# Patient Record
Sex: Female | Born: 1970 | Race: White | Hispanic: No | Marital: Single | Smoking: Never smoker
Health system: Southern US, Community
[De-identification: ages and names within clinical notes are randomized; demographics above are authoritative.]

## PROBLEM LIST (undated history)

## (undated) DIAGNOSIS — J45990 Exercise induced bronchospasm: Secondary | ICD-10-CM

## (undated) DIAGNOSIS — K429 Umbilical hernia without obstruction or gangrene: Secondary | ICD-10-CM

## (undated) HISTORY — PX: HERNIA REPAIR: SHX51

## (undated) HISTORY — PX: OTHER SURGICAL HISTORY: SHX169

## (undated) HISTORY — DX: Umbilical hernia without obstruction or gangrene: K42.9

## (undated) HISTORY — PX: DENTAL SURGERY: SHX609

## (undated) HISTORY — DX: Exercise induced bronchospasm: J45.990

---

## 2010-10-13 ENCOUNTER — Inpatient Hospital Stay (HOSPITAL_COMMUNITY)
Admission: AD | Admit: 2010-10-13 | Discharge: 2010-10-14 | Disposition: A | Discharge status: Home / Self Care | Payer: Medicaid Other | Source: Ambulatory Visit | Attending: Obstetrics and Gynecology | Admitting: Obstetrics and Gynecology

## 2010-10-13 ENCOUNTER — Inpatient Hospital Stay (HOSPITAL_COMMUNITY)
Admission: AD | Admit: 2010-10-13 | Discharge: 2010-10-13 | Disposition: A | Discharge status: Home / Self Care | Payer: Medicaid Other | Source: Ambulatory Visit | Attending: Obstetrics and Gynecology | Admitting: Obstetrics and Gynecology

## 2010-10-13 DIAGNOSIS — O479 False labor, unspecified: Secondary | ICD-10-CM | POA: Insufficient documentation

## 2010-10-14 ENCOUNTER — Inpatient Hospital Stay (HOSPITAL_COMMUNITY)
Admission: AD | Admit: 2010-10-14 | Discharge: 2010-10-17 | DRG: 775 | Disposition: A | Discharge status: Home / Self Care | Payer: Medicaid Other | Source: Ambulatory Visit | Attending: Obstetrics and Gynecology | Admitting: Obstetrics and Gynecology

## 2010-10-14 DIAGNOSIS — O09529 Supervision of elderly multigravida, unspecified trimester: Secondary | ICD-10-CM | POA: Diagnosis present

## 2010-10-14 LAB — CBC
HCT: 35.9 % — ABNORMAL LOW (ref 36.0–46.0)
Hemoglobin: 12.7 g/dL (ref 12.0–15.0)
MCH: 35 pg — ABNORMAL HIGH (ref 26.0–34.0)
MCV: 98.9 fL (ref 78.0–100.0)
RBC: 3.63 MIL/uL — ABNORMAL LOW (ref 3.87–5.11)
WBC: 12.6 10*3/uL — ABNORMAL HIGH (ref 4.0–10.5)

## 2010-10-15 LAB — CBC
HCT: 33.3 % — ABNORMAL LOW (ref 36.0–46.0)
Hemoglobin: 11.7 g/dL — ABNORMAL LOW (ref 12.0–15.0)
MCH: 35.2 pg — ABNORMAL HIGH (ref 26.0–34.0)
MCHC: 35.1 g/dL (ref 30.0–36.0)
RBC: 3.32 MIL/uL — ABNORMAL LOW (ref 3.87–5.11)

## 2010-10-17 ENCOUNTER — Encounter (HOSPITAL_COMMUNITY)
Admission: RE | Admit: 2010-10-17 | Discharge: 2010-10-17 | Disposition: A | Discharge status: Home / Self Care | Payer: Self-pay | Source: Ambulatory Visit | Attending: Obstetrics and Gynecology | Admitting: Obstetrics and Gynecology

## 2010-10-17 DIAGNOSIS — O923 Agalactia: Secondary | ICD-10-CM | POA: Insufficient documentation

## 2010-11-17 ENCOUNTER — Encounter (HOSPITAL_COMMUNITY)
Admission: RE | Admit: 2010-11-17 | Discharge: 2010-11-17 | Disposition: A | Discharge status: Home / Self Care | Payer: Medicaid Other | Source: Ambulatory Visit | Attending: Obstetrics and Gynecology | Admitting: Obstetrics and Gynecology

## 2010-11-17 DIAGNOSIS — O923 Agalactia: Secondary | ICD-10-CM | POA: Insufficient documentation

## 2010-12-18 ENCOUNTER — Encounter (HOSPITAL_COMMUNITY)
Admission: RE | Admit: 2010-12-18 | Discharge: 2010-12-18 | Disposition: A | Discharge status: Home / Self Care | Payer: Medicaid Other | Source: Ambulatory Visit | Attending: Obstetrics and Gynecology | Admitting: Obstetrics and Gynecology

## 2010-12-18 DIAGNOSIS — O923 Agalactia: Secondary | ICD-10-CM | POA: Insufficient documentation

## 2011-01-18 ENCOUNTER — Encounter (HOSPITAL_COMMUNITY)
Admission: RE | Admit: 2011-01-18 | Discharge: 2011-01-18 | Disposition: A | Discharge status: Home / Self Care | Payer: Self-pay | Source: Ambulatory Visit | Attending: Obstetrics and Gynecology | Admitting: Obstetrics and Gynecology

## 2011-01-18 DIAGNOSIS — O923 Agalactia: Secondary | ICD-10-CM | POA: Insufficient documentation

## 2011-02-18 ENCOUNTER — Encounter (HOSPITAL_COMMUNITY)
Admission: RE | Admit: 2011-02-18 | Discharge: 2011-02-18 | Disposition: A | Discharge status: Home / Self Care | Payer: Self-pay | Source: Ambulatory Visit | Attending: Obstetrics and Gynecology | Admitting: Obstetrics and Gynecology

## 2011-02-18 DIAGNOSIS — O923 Agalactia: Secondary | ICD-10-CM | POA: Insufficient documentation

## 2011-03-21 ENCOUNTER — Encounter (HOSPITAL_COMMUNITY)
Admission: RE | Admit: 2011-03-21 | Discharge: 2011-03-21 | Disposition: A | Discharge status: Home / Self Care | Payer: Self-pay | Source: Ambulatory Visit | Attending: Obstetrics and Gynecology | Admitting: Obstetrics and Gynecology

## 2011-03-21 DIAGNOSIS — O923 Agalactia: Secondary | ICD-10-CM | POA: Insufficient documentation

## 2011-04-21 ENCOUNTER — Encounter (HOSPITAL_COMMUNITY)
Admission: RE | Admit: 2011-04-21 | Discharge: 2011-04-21 | Disposition: A | Discharge status: Home / Self Care | Payer: Self-pay | Source: Ambulatory Visit | Attending: Obstetrics and Gynecology | Admitting: Obstetrics and Gynecology

## 2011-04-21 DIAGNOSIS — O923 Agalactia: Secondary | ICD-10-CM | POA: Insufficient documentation

## 2015-08-19 ENCOUNTER — Other Ambulatory Visit: Payer: Self-pay | Admitting: Physician Assistant

## 2015-08-19 DIAGNOSIS — Z1231 Encounter for screening mammogram for malignant neoplasm of breast: Secondary | ICD-10-CM

## 2015-08-28 ENCOUNTER — Ambulatory Visit
Admission: RE | Admit: 2015-08-28 | Discharge: 2015-08-28 | Disposition: A | Discharge status: Home / Self Care | Payer: BLUE CROSS/BLUE SHIELD | Source: Ambulatory Visit | Attending: Physician Assistant | Admitting: Physician Assistant

## 2015-08-28 DIAGNOSIS — Z1231 Encounter for screening mammogram for malignant neoplasm of breast: Secondary | ICD-10-CM

## 2016-01-22 ENCOUNTER — Encounter: Payer: Self-pay | Admitting: Sports Medicine

## 2016-01-22 ENCOUNTER — Ambulatory Visit (INDEPENDENT_AMBULATORY_CARE_PROVIDER_SITE_OTHER): Payer: BLUE CROSS/BLUE SHIELD | Admitting: Sports Medicine

## 2016-01-22 DIAGNOSIS — L603 Nail dystrophy: Secondary | ICD-10-CM

## 2016-01-22 DIAGNOSIS — R21 Rash and other nonspecific skin eruption: Secondary | ICD-10-CM

## 2016-01-22 DIAGNOSIS — M79675 Pain in left toe(s): Secondary | ICD-10-CM | POA: Diagnosis not present

## 2016-01-22 DIAGNOSIS — M79674 Pain in right toe(s): Secondary | ICD-10-CM | POA: Diagnosis not present

## 2016-01-22 NOTE — Progress Notes (Deleted)
   Subjective:    Patient ID: Ariana Walsh, female    DOB: [birth year], 45 y.o.   MRN: [id number]  HPI    Review of Systems  Allergic/Immunologic: Positive for environmental allergies.  All other systems reviewed and are negative.      Objective:   Physical Exam        Assessment & Plan:

## 2016-01-22 NOTE — Progress Notes (Signed)
Patient ID: Ariana Walsh, female   DOB: [birth year], 45 y.o.   MRN: [id number] Subjective: Ariana Walsh is a 45 y.o. female patient seen today in office with complaint of painful thickened and discolored nails. Patient is desiring treatment for nail changes; has tried OTC topicals/Medication in the past with no improvement. Reports that nails are becoming difficult to manage because of the thickness. States that this has been going on for several months versus notice few months ago after her nail polish was removed and her pedicurist recommended tea tree oil, VapoRub, and antifungal. Patient has also been soaking with vinegar and Listerine and has developed an eruptive rash to both big toes that is painful in each in nature, has tried antifungal cream with no improvement. Patient has no other pedal complaints at this time.   There are no active problems to display for this patient.   No current outpatient prescriptions on file prior to visit.   No current facility-administered medications on file prior to visit.    Allergies  Allergen Reactions  . Latex     Objective: Physical Exam  General: Well developed, nourished, no acute distress, awake, alert and oriented x 3  Vascular: Dorsalis pedis artery 2/4 bilateral, Posterior tibial artery 2/4 bilateral, skin temperature warm to warm proximal to distal bilateral lower extremities, no varicosities, pedal hair present bilateral.  Neurological: Gross sensation present via light touch bilateral.   Dermatological: Skin is warm, dry, and supple bilateral, Nails 1-10 are tender,Extremely short thick, and discolored with mild subungal debris distally, no webspace macerations present bilateral, no open lesions present bilateral, no callus/corns/hyperkeratotic tissue present bilateral. Eruptive rash noted to the hallux just proximal to the nail folds that he is pruritic in nature with No acute signs of infection  bilateral.  Musculoskeletal: No boney deformities noted bilateral. Muscular strength within normal limits without painon range of motion. No pain with calf compression bilateral.  Assessment and Plan:  Problem List Items Addressed This Visit    None    Visit Diagnoses    Rash/skin eruption    -  Primary    Bilateral hallux    Nail dystrophy        Toe pain, bilateral           -Examined patient -Discussed treatment options for painful dystrophic nails  -Advised patient to return to office in 1 month when nails grow to obtain an adequate fungal culture -Advised good hygiene habits in the meantime -Recommend patient discontinue use of Listerine and aggressive scrubbing and soaking. Advised patient to purchase over-the-counter cortisone 10 cream to apply to eruptive blistered areas and to soak as needed.,nce to twice weekly only with Epsom salts help dry up these blistered areas and to refrain from using excessive topicals at this time until fungal nail cultures have been obtained to determine if patient has truly fungus other types of organisms causing skin and nail changes Today, applied castenalli paint to eruptive blistered areas covered with Band-Aid- -Patient to return in 4 weeks for follow up evaluation and to obtain fungal culture or sooner if symptoms worsen.  Patient reports that her dad has A1 liver disease and if there is fungus present in her nails most likely want to try laser and or topical treatment.  Asencion Islamitorya Fayetta Sorenson, DPM

## 2016-02-24 ENCOUNTER — Ambulatory Visit (INDEPENDENT_AMBULATORY_CARE_PROVIDER_SITE_OTHER): Payer: BLUE CROSS/BLUE SHIELD | Admitting: Sports Medicine

## 2016-02-24 DIAGNOSIS — M79674 Pain in right toe(s): Secondary | ICD-10-CM | POA: Diagnosis not present

## 2016-02-24 DIAGNOSIS — R21 Rash and other nonspecific skin eruption: Secondary | ICD-10-CM | POA: Diagnosis not present

## 2016-02-24 DIAGNOSIS — L603 Nail dystrophy: Secondary | ICD-10-CM | POA: Diagnosis not present

## 2016-02-24 DIAGNOSIS — M79675 Pain in left toe(s): Secondary | ICD-10-CM

## 2016-02-24 MED ORDER — CLOTRIMAZOLE 1 % EX SOLN: 1.0000 "application " | Freq: Two times a day (BID) | CUTANEOUS | 5 refills | Status: DC

## 2016-02-24 NOTE — Progress Notes (Signed)
  Patient ID: Ariana Walsh, female   DOB: 21-Feb-1971, 45 y.o.   MRN: [id number] Subjective: Ariana KittenJennifer L Walsh is a 45 y.o. female patient seen today in office for nail culture and follow up eval of rash at toes; reports that cortisone cream has helped and stopped using tea tree oil. Admits to occasional itching and burning. Use balm moisturizing cream for help with dryness. Patient has no other pedal complaints at this time.   There are no active problems to display for this patient.   No current outpatient prescriptions on file prior to visit.   No current facility-administered medications on file prior to visit.     Allergies  Allergen Reactions  . Latex     Objective: Physical Exam  General: Well developed, nourished, no acute distress, awake, alert and oriented x 3  Vascular: Dorsalis pedis artery 2/4 bilateral, Posterior tibial artery 2/4 bilateral, skin temperature warm to warm proximal to distal bilateral lower extremities, no varicosities, pedal hair present bilateral.  Neurological: Gross sensation present via light touch bilateral.   Dermatological: Skin is warm, dry, and supple bilateral, Nails 1-10 are tender, long, and discolored with mild subungal debris distally, no webspace macerations present bilateral, no open lesions present bilateral, no callus/corns/hyperkeratotic tissue present bilateral. Improving Eruptive rash noted to the hallux and now interspaces all areas just proximal to the nail folds that he is pruritic in nature with no acute signs of infection bilateral.  Musculoskeletal: No boney deformities noted bilateral. Muscular strength within normal limits without painon range of motion. No pain with calf compression bilateral.  Assessment and Plan:  Problem List Items Addressed This Visit    None    Visit Diagnoses    Nail dystrophy    -  Primary   Relevant Medications   clotrimazole (LOTRIMIN) 1 % external solution   Other Relevant Orders   Fungus Culture with Smear   Rash/skin eruption       Relevant Medications   clotrimazole (LOTRIMIN) 1 % external solution   Toe pain, bilateral       Relevant Medications   clotrimazole (LOTRIMIN) 1 % external solution     -Examined patient -Discussed treatment options for painful dystrophic nails  -[facility]; Will call patient with results -Rx Clotrimazole solution to use in between toes and around cuticles  -Advised good hygiene habits in the meantime -Recommend patient to discontinue harsh soaks and continue skin emollients -Patient to return as needed or sooner if symptoms worsen.  Patient reports that her dad has A1 liver disease and if there is fungus present in her nails most likely want to try laser and or topical treatment.  Asencion Islamitorya Cayle Cordoba, DPM

## 2016-03-05 ENCOUNTER — Telehealth: Payer: Self-pay | Admitting: *Deleted

## 2016-03-05 MED ORDER — NONFORMULARY OR COMPOUNDED ITEM: 11 refills | Status: DC

## 2016-03-05 NOTE — Telephone Encounter (Addendum)
-----   Message from Asencion Islamitorya Stover, [place] sent at 03/05/2016  7:22 AM EDT ----- Regarding: [facility] Fungal culture results Can you call patient to let her know that her fungal culture results were positive for fungus (T. Rubrum). Offer Topical from [facility] and laser to the patient as treatment.  Patient told me last office visit that her dad has A1 liver disease and if there is fungus present in her nails most likely want to try laser and or topical treatment  -Dr. Marylene LandStover. Informed pt of Dr. Wynema BirchStover's orders, gave [facility], and transferred schedulers to schedule laser treatment.

## 2016-03-05 NOTE — Addendum Note (Signed)
Addended by: Alphia Kava'CONNELL, VALERY D on: 03/05/2016 11:12 AM   Modules accepted: Orders

## 2016-03-09 ENCOUNTER — Ambulatory Visit: Payer: BLUE CROSS/BLUE SHIELD

## 2016-03-09 DIAGNOSIS — R21 Rash and other nonspecific skin eruption: Secondary | ICD-10-CM

## 2016-03-09 DIAGNOSIS — L603 Nail dystrophy: Secondary | ICD-10-CM

## 2016-03-09 DIAGNOSIS — B351 Tinea unguium: Secondary | ICD-10-CM

## 2016-03-09 NOTE — Progress Notes (Signed)
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   All other systems are negative, noted red irritated tissues on toes bilateral  Laser therapy administered to affected nails and tolerated well. All safety precautions were in place. Advised patient to discontinue all soaks,, only do tea tree oil treatments once a day and dilute in oil to prevent tissue irritation. Advised to continue with prescribed topical. She is to follow up next moth for 2nd of 6 laser treatments

## 2016-03-11 ENCOUNTER — Telehealth: Payer: Self-pay | Admitting: *Deleted

## 2016-03-11 NOTE — Telephone Encounter (Signed)
Pt states she received a compound for her nails and doesn't know how to use it. I told her to put it on the affected toenails and pt states she's not sure which are affected but they are doing laser on all toenails. I told her she could do all. Pt states she'll do all with Q-Tips.

## 2016-04-06 ENCOUNTER — Ambulatory Visit (INDEPENDENT_AMBULATORY_CARE_PROVIDER_SITE_OTHER): Payer: BLUE CROSS/BLUE SHIELD

## 2016-04-06 DIAGNOSIS — L603 Nail dystrophy: Secondary | ICD-10-CM

## 2016-04-08 NOTE — Progress Notes (Signed)
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   All other systems are negative, noted no red or irritated tissue surrounding nails. Skin healing well  Laser therapy administered to affected nails and tolerated well. All safety precautions were in place. Advised to continue with prescribed topical. She is to follow up next month for 3rd of 6 laser treatments

## 2016-05-05 ENCOUNTER — Other Ambulatory Visit (INDEPENDENT_AMBULATORY_CARE_PROVIDER_SITE_OTHER): Payer: BLUE CROSS/BLUE SHIELD

## 2016-05-10 ENCOUNTER — Other Ambulatory Visit: Payer: Self-pay | Admitting: Surgery

## 2016-06-01 ENCOUNTER — Ambulatory Visit: Payer: Self-pay

## 2016-06-01 DIAGNOSIS — L603 Nail dystrophy: Secondary | ICD-10-CM

## 2016-06-02 NOTE — Progress Notes (Signed)
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   All other systems are negative, noted no red or irritated tissue surrounding nails. Skin healing well  Laser therapy administered to affected nails and tolerated well. All safety precautions were in place. Advised to continue with prescribed topical. She is to follow up next month for 5th  of 6 laser treatments

## 2016-06-30 ENCOUNTER — Ambulatory Visit: Payer: Self-pay | Admitting: Sports Medicine

## 2016-06-30 DIAGNOSIS — M79675 Pain in left toe(s): Secondary | ICD-10-CM

## 2016-06-30 DIAGNOSIS — M79674 Pain in right toe(s): Secondary | ICD-10-CM

## 2016-06-30 DIAGNOSIS — L603 Nail dystrophy: Secondary | ICD-10-CM

## 2016-06-30 DIAGNOSIS — B351 Tinea unguium: Secondary | ICD-10-CM

## 2016-07-23 NOTE — Progress Notes (Signed)
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   All other systems are negative, noted no red or irritated tissue surrounding nails. Skin healing well  Laser therapy administered to affected nails and tolerated well. All safety precautions were in place. Advised to continue with prescribed topical. She is to follow up next month for final laser treatment

## 2016-07-26 NOTE — Progress Notes (Signed)
Case discussed with RN -Dr. Jermarcus Mcfadyen 

## 2016-07-30 ENCOUNTER — Ambulatory Visit: Payer: Managed Care, Other (non HMO)

## 2016-07-30 DIAGNOSIS — L603 Nail dystrophy: Secondary | ICD-10-CM

## 2016-07-30 DIAGNOSIS — B351 Tinea unguium: Secondary | ICD-10-CM

## 2016-07-30 NOTE — Progress Notes (Signed)
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   All other systems are negative, noted no red or irritated tissue surrounding nails. Skin healing well  Laser therapy administered to affected nails and tolerated well. All safety precautions were in place. Due to patient showing a lot of concern regarding condition of 5th nails, I advised her to follow up with Dr Marylene LandStover in 1 month

## 2016-08-03 ENCOUNTER — Other Ambulatory Visit: Payer: Self-pay

## 2016-08-03 DIAGNOSIS — L603 Nail dystrophy: Secondary | ICD-10-CM

## 2016-08-03 NOTE — Progress Notes (Signed)
[facility]

## 2016-08-12 ENCOUNTER — Encounter: Payer: Self-pay | Admitting: Sports Medicine

## 2016-08-16 ENCOUNTER — Telehealth: Payer: Self-pay | Admitting: Sports Medicine

## 2016-08-16 NOTE — Telephone Encounter (Signed)
Pt returning your call and apologized for being short this morning. She request you call her back tomorrow morning.

## 2016-08-17 ENCOUNTER — Telehealth: Payer: Self-pay

## 2016-08-17 NOTE — Telephone Encounter (Signed)
LVM regarding negative fungal results. Results did shoe some microtrauma to 5th nails bilateral probably due to shoe usage. Advised her that she can keep her appt if she wanted to and if she had any other questions she can call the office

## 2016-08-24 ENCOUNTER — Encounter: Payer: Self-pay | Admitting: Sports Medicine

## 2016-08-24 ENCOUNTER — Ambulatory Visit (INDEPENDENT_AMBULATORY_CARE_PROVIDER_SITE_OTHER): Payer: Managed Care, Other (non HMO) | Admitting: Sports Medicine

## 2016-08-24 DIAGNOSIS — M79674 Pain in right toe(s): Secondary | ICD-10-CM | POA: Diagnosis not present

## 2016-08-24 DIAGNOSIS — L603 Nail dystrophy: Secondary | ICD-10-CM | POA: Diagnosis not present

## 2016-08-24 DIAGNOSIS — M79675 Pain in left toe(s): Secondary | ICD-10-CM

## 2016-08-24 DIAGNOSIS — M2041 Other hammer toe(s) (acquired), right foot: Secondary | ICD-10-CM | POA: Diagnosis not present

## 2016-08-24 DIAGNOSIS — L84 Corns and callosities: Secondary | ICD-10-CM | POA: Diagnosis not present

## 2016-08-24 NOTE — Progress Notes (Signed)
  Patient ID: Ariana Walsh, female   DOB: [birth year], 46 y.o.   MRN: [id number] Subjective: Ariana KittenJennifer L Walsh is a 46 y.o. female patient seen today s/p laser for nail fungus and states that her nails look better and that now she has rubbing and thick skin at nail fold on right. Patient has no other pedal complaints at this time.   There are no active problems to display for this patient.   Current Outpatient Prescriptions on File Prior to Visit  Medication Sig Dispense Refill  . clotrimazole (LOTRIMIN) 1 % external solution Apply 1 application topically 2 (two) times daily. In between toes and at nail cuticles 30 mL 5  . NONFORMULARY OR COMPOUNDED ITEM [facility]:  Onychomycosis Nail Lacquer - Fluconazole 2%, Terbinafine 1%, DMSO apply to affected area daily. 120 each 11   No current facility-administered medications on file prior to visit.     Allergies  Allergen Reactions  . Latex     Objective: Physical Exam  General: Well developed, nourished, no acute distress, awake, alert and oriented x 3  Vascular: Dorsalis pedis artery 2/4 bilateral, Posterior tibial artery 2/4 bilateral, skin temperature warm to warm proximal to distal bilateral lower extremities, no varicosities, pedal hair present bilateral.  Neurological: Gross sensation present via light touch bilateral.   Dermatological: Skin is warm, dry, and supple bilateral, Nails 1-10 short with clear appearence to nail bed no residual fungus, small keratosis/corn at right 5th toenail at medial margin, no acute signs of infection bilateral.  Musculoskeletal: Mild varus hammertoe boney deformities noted bilateral R>L. Muscular strength within normal limits without painon range of motion. No pain with calf compression bilateral.  Assessment and Plan:  Problem List Items Addressed This Visit    None    Visit Diagnoses    Nail dystrophy    -  Primary   Toe pain, bilateral       Hammer toe of right foot       4-5th    Corns and callosities       Medial 5th toenail at nail fold     -Examined patient -Discussed hammertoe with keratosis at right 5th toe medial nail fold; using tissue nipper debrided keratosis at site and dispensed toe separator  -Recommend consider hammertoe surgery if pain recurs or worsen  -Discussed continued care for painful dystrophic nails  -Continue Clotrimazole solution to use in between toes and around cuticles and tea tree oil to nourish nails and file nail and nail folds as needed  -Continue with steri-shoe  -Advised good hygiene habits  -Patient to return as needed or sooner if symptoms worsen.  Asencion Islamitorya Samuele Storey, DPM

## 2016-12-13 ENCOUNTER — Telehealth: Payer: Self-pay | Admitting: *Deleted

## 2016-12-13 NOTE — Telephone Encounter (Signed)
Pt states she trimmed her toenails back and it appears she has another fungal infection and she would like to discuss possible laser treatment with Shanda BumpsJessica.

## 2016-12-21 ENCOUNTER — Ambulatory Visit (INDEPENDENT_AMBULATORY_CARE_PROVIDER_SITE_OTHER): Payer: Managed Care, Other (non HMO) | Admitting: Sports Medicine

## 2016-12-21 DIAGNOSIS — L603 Nail dystrophy: Secondary | ICD-10-CM | POA: Diagnosis not present

## 2016-12-28 NOTE — Progress Notes (Signed)
Pt presents stating that she is concerned with the appearance of her nails, she is worried that the fungal infection has returned.    Noted slight white discoloration to hallux nails, small area.  Sent nail clippings off to [facility]  Advised to continue with topical nail laquer and we will call her with [facility] results and treatment based on results

## 2016-12-29 ENCOUNTER — Other Ambulatory Visit: Payer: Self-pay | Admitting: Physician Assistant

## 2016-12-29 DIAGNOSIS — Z1231 Encounter for screening mammogram for malignant neoplasm of breast: Secondary | ICD-10-CM

## 2016-12-29 NOTE — Progress Notes (Signed)
Agree with nurse note -Dr. Hodge Stachnik 

## 2016-12-30 ENCOUNTER — Encounter: Payer: Self-pay | Admitting: Physician Assistant

## 2016-12-30 ENCOUNTER — Ambulatory Visit (INDEPENDENT_AMBULATORY_CARE_PROVIDER_SITE_OTHER): Payer: Managed Care, Other (non HMO) | Admitting: Physician Assistant

## 2016-12-30 VITALS — BP 114/85 | HR 71 | Temp 98.0°F | Resp 18 | Ht 65.5 in | Wt 169.4 lb

## 2016-12-30 DIAGNOSIS — K429 Umbilical hernia without obstruction or gangrene: Secondary | ICD-10-CM

## 2016-12-30 DIAGNOSIS — L603 Nail dystrophy: Secondary | ICD-10-CM | POA: Insufficient documentation

## 2016-12-30 DIAGNOSIS — Z6827 Body mass index (BMI) 27.0-27.9, adult: Secondary | ICD-10-CM | POA: Insufficient documentation

## 2016-12-30 DIAGNOSIS — Z7689 Persons encountering health services in other specified circumstances: Secondary | ICD-10-CM

## 2016-12-30 DIAGNOSIS — L739 Follicular disorder, unspecified: Secondary | ICD-10-CM

## 2016-12-30 MED ORDER — MUPIROCIN 2 % EX OINT: 1.0000 "application " | TOPICAL_OINTMENT | Freq: Three times a day (TID) | CUTANEOUS | 1 refills | Status: DC

## 2016-12-30 NOTE — Progress Notes (Signed)
Patient ID: Ariana Walsh, female     DOB: [birth year], 46 y.o.    MRN: [id number]  PCP: Porfirio Oar, PA-C  Chief Complaint  Patient presents with  . Establish Care    per PT would like to talk about recent medical issues.    Subjective:   This patient is new to me and presents to establish care.  Another patient, Ariana Walsh, is a friend of her and recommended me to her.  She has a history of asthma and allergies, and has an umbilical hernia. Dr. Rayburn Ma has recommended surgical repair, but she has delayed, waiting for a more convenient time. It doesn't hurt at all, but she has to push it back in regularly.  She has a history of bilateral breast tenderness, which resolved when she eliminated caffeine. She has resumed caffeine, and has noticed a recurrence of the breast pain, but it is intermittent and not as severe as previously. Mammogram is scheduled for this summer.  Her 26 year-old son, Ariana Walsh, was conceived by IVF.  She and her husband had split up by the time she conceived, but they have not married. He is [place] and splits his [place] and [place]. They are in the process of re-evaluating their relationship and whether it may be time to formally/legally end it.  Her mother is being evaluated for possible LYNCH syndrome, and the patient has had genetic counseling and testing. Her results are not yet available.  Believes that she may be approaching menopause Menstrual cycle is starting to shorten, down to 26 days from 28. Random hot flashes at night. "I know it's coming." "I know that I don't want to do hormones." Joints-"hips are funky," felt like one hip gave way last summer when running to catch a train at [place], intermittent pain. DeQuervains following birth of her son. Again, thinks these issues are hormonal. 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  Skin on her arms break out a  lot. Recalls this was a problem as a child, but when she lived in the [place] x 15 years, it stopped, recurring when she moved back to the [place]. Bumpy, painfully bumpy.  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 She a "picker" and can't keep herself from scratching and picking at the lesions, leaving sores.  Review of Systems As above. No CP, SOB, HA, dizziness, nausea, vomiting, diarrhea, constipation.  Prior to Admission medications   Medication Sig Start Date End Date Taking? Authorizing Provider  clotrimazole (LOTRIMIN) 1 % external solution Apply 1 application topically 2 (two) times daily. In between toes and at nail cuticles 02/24/16  Yes Asencion Islam, DPM  NONFORMULARY OR COMPOUNDED ITEM [facility]:  Onychomycosis Nail Lacquer - Fluconazole 2%, Terbinafine 1%, DMSO apply to affected area daily. 03/05/16  Yes Asencion Islam, DPM     Allergies  Allergen Reactions  . Latex      Patient Active Problem List   Diagnosis Date Noted  . Umbilical hernia 12/30/2016  . BMI 27.0-27.9,adult 12/30/2016  . Dystrophic nail 12/30/2016     Family History  Problem Relation Age of Onset  . Breast cancer Mother        post-menopausal, DCIS  . Endometrial cancer Mother        mid-late 87's  . Hypertension Mother   . Hypertension Father   . Diabetes Father   . Heart disease Father  s/p 3V CABG age 47  . Mental illness Father        depression  . Arthritis Father        bilateral knee replacements  . Mental illness Brother        depression     Social History   Social History  . Marital status: Married    Spouse name: [name]  . Number of children: 1  . Years of education: MFA in Directing   Occupational History  . unemployed     recently completed graduate degree, looking for work   Social History Main Topics  . Smoking status: Never Smoker  . Smokeless tobacco: Never Used  . Alcohol use 0.0 - 1.8 oz/week  . Drug use: No  . Sexual activity: Not Currently     Partners: Male   Other Topics Concern  . Not on file   Social History Narrative   Estranged from husband since 2011.   Lives with her son.   Her son was conceived by IVF after she and her husband split.   Breast fed x 4 years.            Objective:  Physical Exam  Constitutional: She is oriented to person, place, and time. She appears well-developed and well-nourished. She is active and cooperative. No distress.  BP 114/85 (BP Location: Right Arm, Patient Position: Sitting, Cuff Size: Normal)   Pulse 71   Temp 98 F (36.7 C) (Oral)   Resp 18   Ht 5' 5.5" (1.664 m)   Wt 169 lb 6.4 oz (76.8 kg)   LMP 12/14/2016   SpO2 100%   BMI 27.76 kg/m   HENT:  Head: Normocephalic and atraumatic.  Right Ear: Hearing normal.  Left Ear: Hearing normal.  Eyes: Conjunctivae are normal. No scleral icterus.  Neck: Normal range of motion. Neck supple. No thyromegaly present.  Cardiovascular: Normal rate, regular rhythm and normal heart sounds.   Pulses:      Radial pulses are 2+ on the right side, and 2+ on the left side.  Pulmonary/Chest: Effort normal and breath sounds normal.  Lymphadenopathy:       Head (right side): No tonsillar, no preauricular, no posterior auricular and no occipital adenopathy present.       Head (left side): No tonsillar, no preauricular, no posterior auricular and no occipital adenopathy present.    She has no cervical adenopathy.       Right: No supraclavicular adenopathy present.       Left: No supraclavicular adenopathy present.  Neurological: She is alert and oriented to person, place, and time. No sensory deficit.  Skin: Skin is warm, dry and intact. Lesion (multiple excoriated papules on the upper arms, consistent with her description of picking at keratosis pilaris-type rash) noted. No rash noted. No cyanosis or erythema. Nails show no clubbing.  Psychiatric: She has a normal mood and affect. Her speech is normal and behavior is normal.      Assessment &  Plan:   Problem List Items Addressed This Visit    Umbilical hernia    surgeryOK to continue to observe for now. If it becomes painful, non-reducible, she will proceed with surgical intervention.      BMI 27.0-27.9,adult    Healthy eating, regular exercise       Other Visit Diagnoses    Encounter to establish care    -  Primary   Plan to see her annually for wellness, more often as needed.  Folliculitis       Possibly underlying keratosis pilaris. Topical treatment per patient preference.   Relevant Medications   mupirocin ointment (BACTROBAN) 2 %       No Follow-up on file.   Fernande Bras, PA-C [facility]

## 2016-12-30 NOTE — Assessment & Plan Note (Addendum)
surgeryOK to continue to observe for now. If it becomes painful, non-reducible, she will proceed with surgical intervention.

## 2016-12-30 NOTE — Assessment & Plan Note (Signed)
Healthy eating, regular exercise! 

## 2016-12-30 NOTE — Patient Instructions (Addendum)
We recommend that you schedule a mammogram for breast cancer screening. Typically, you do not need a referral to do this. Please contact a local imaging center to schedule your mammogram.  [facility] - [phone]  *ask for the Radiology Department [facility]) - [phone] or [phone]  [facility] - [phone] [facility] - [phone] [facility] - [phone]  *ask for the Radiology Department [facility] - [phone]  *ask for the Radiology Department [facility] - [phone]  *ask for the Mammography Department [facility] - [phone]     IF you received an x-ray today, you will receive an invoice from [facility]. Please contact [facility] at [phone] with questions or concerns regarding your invoice.   IF you received labwork today, you will receive an invoice from [facility]. Please contact [facility] at [phone] with questions or concerns regarding your invoice.   Our billing staff will not be able to assist you with questions regarding bills from these companies.  You will be contacted with the lab results as soon as they are available. The fastest way to get your results is to activate your My Chart account. Instructions are located on the last page of this paperwork. If you have not heard from us regarding the results in 2 weeks, please contact this office.      

## 2017-01-05 ENCOUNTER — Encounter: Payer: Self-pay | Admitting: Sports Medicine

## 2017-01-10 ENCOUNTER — Ambulatory Visit
Admission: RE | Admit: 2017-01-10 | Discharge: 2017-01-10 | Disposition: A | Discharge status: Home / Self Care | Payer: Managed Care, Other (non HMO) | Source: Ambulatory Visit | Attending: Physician Assistant | Admitting: Physician Assistant

## 2017-01-10 DIAGNOSIS — Z1231 Encounter for screening mammogram for malignant neoplasm of breast: Secondary | ICD-10-CM

## 2017-03-16 ENCOUNTER — Telehealth: Payer: Self-pay | Admitting: Sports Medicine

## 2017-03-16 NOTE — Telephone Encounter (Signed)
I am trying to get the nail lacquer re-ordered through [facility] as I have ran out of refills. I was told by them that they said your office stated you have no records of me. I've had the laser treatment done by Shanda BumpsJessica and I'm a Dr. Marylene LandStover pt. Also, I had some lab/pathology done and I have never received the results of those. I would like the lacquer to be reordered because I'm using it proactively.

## 2017-03-17 MED ORDER — NONFORMULARY OR COMPOUNDED ITEM: 11 refills | Status: DC

## 2017-03-17 NOTE — Addendum Note (Signed)
Addended by: Alphia Kava'CONNELL, Fransico Sciandra D on: 03/17/2017 01:35 PM   Modules accepted: Orders

## 2017-03-17 NOTE — Telephone Encounter (Signed)
Left message informing pt, Dr. Marylene LandStover had ordered refill of the [facility] Nail Lacquer as prn 1 year. [facility].

## 2017-07-02 ENCOUNTER — Encounter: Payer: Self-pay | Admitting: Physician Assistant

## 2017-07-02 ENCOUNTER — Other Ambulatory Visit: Payer: Self-pay

## 2017-07-02 ENCOUNTER — Ambulatory Visit: Payer: Managed Care, Other (non HMO) | Admitting: Physician Assistant

## 2017-07-02 VITALS — BP 118/72 | HR 71 | Temp 97.6°F | Resp 18 | Ht 65.5 in | Wt 170.2 lb

## 2017-07-02 DIAGNOSIS — N644 Mastodynia: Secondary | ICD-10-CM | POA: Diagnosis not present

## 2017-07-02 DIAGNOSIS — R059 Cough, unspecified: Secondary | ICD-10-CM

## 2017-07-02 DIAGNOSIS — R05 Cough: Secondary | ICD-10-CM | POA: Diagnosis not present

## 2017-07-02 MED ORDER — ALBUTEROL SULFATE HFA 108 (90 BASE) MCG/ACT IN AERS: 2.0000 | INHALATION_SPRAY | RESPIRATORY_TRACT | 1 refills | Status: DC | PRN

## 2017-07-02 NOTE — Progress Notes (Signed)
Patient ID: Ariana Walsh, female    DOB: December 30, 1970, 46 y.o.   MRN: [id number]  PCP: Porfirio OarJeffery, Phyllis Whitefield, PA-C  Chief Complaint  Patient presents with  . Breast Pain    x1 month, right breast, pt states the breast pain is deep within her breast and she states she has a bruise that she doesn't know where it came from. Pt states she doesn't have a lump and no discharge.    Subjective:   Presents for evaluation of tenderness and a bruise on the RIGHT breast.   Her breasts have been tender, for about a month, primarily on the RIGHT. It's a "deep ache" and constant. Different from tenderness she has experienced associated with caffeine use.  The bruise appeared several weeks ago, resolved, then recurred. No recalled trauma or injury, new activity, etc.  No redness, skin lesions, swelling, nipple discharge. No fever, chills. No axillary lumps or tenderness.    Review of Systems  Constitutional: Positive for fatigue. Negative for chills and fever.  HENT: Positive for rhinorrhea. Negative for sore throat.   Eyes: Negative for visual disturbance.  Respiratory: Positive for cough. Negative for chest tightness, shortness of breath and wheezing.   Cardiovascular: Negative for chest pain and palpitations.  Gastrointestinal: Negative for abdominal pain, diarrhea, nausea and vomiting.  Genitourinary: Negative for dysuria, frequency, hematuria and urgency.  Musculoskeletal: Negative for arthralgias and myalgias.  Skin: Negative for rash.  Neurological: Negative for dizziness, weakness and headaches.  Psychiatric/Behavioral: Negative for decreased concentration. The patient is not nervous/anxious.        Patient Active Problem List   Diagnosis Date Noted  . Umbilical hernia 12/30/2016  . BMI 27.0-27.9,adult 12/30/2016  . Dystrophic nail 12/30/2016     Prior to Admission medications   Medication Sig Start Date End Date Taking? Authorizing Provider  NONFORMULARY OR  COMPOUNDED ITEM [facility]:  Onychomycosis Nail Lacquer - Fluconazole 2%, Terbinafine 1%, DMSO apply to affected area daily. 03/17/17  Yes Stover, Titorya, DPM  clotrimazole (LOTRIMIN) 1 % external solution Apply 1 application topically 2 (two) times daily. In between toes and at nail cuticles Patient not taking: Reported on 07/02/2017 02/24/16   Asencion IslamStover, Titorya, DPM  mupirocin ointment (BACTROBAN) 2 % Apply 1 application topically 3 (three) times daily. Patient not taking: Reported on 07/02/2017 12/30/16   Porfirio OarJeffery, Yashar Inclan, PA-C     Allergies  Allergen Reactions  . Latex        Objective:  Physical Exam  Constitutional: She is oriented to person, place, and time. She appears well-developed and well-nourished. She is active and cooperative. No distress.  BP 118/72 (BP Location: Right Arm, Patient Position: Sitting, Cuff Size: Normal)   Pulse 71   Temp 97.6 F (36.4 C) (Oral)   Resp 18   Ht 5' 5.5" (1.664 m)   Wt 170 lb 3.2 oz (77.2 kg)   SpO2 97%   BMI 27.89 kg/m    Eyes: Conjunctivae are normal.  Pulmonary/Chest: Effort normal. Right breast exhibits tenderness. Right breast exhibits no inverted nipple, no mass, no nipple discharge and no skin change. Left breast exhibits no inverted nipple, no mass, no nipple discharge, no skin change and no tenderness. Breasts are symmetrical.    Neurological: She is alert and oriented to person, place, and time.  Psychiatric: She has a normal mood and affect. Her speech is normal and behavior is normal.           Assessment & Plan:   1.  Breast pain, right - US BREAST LTD UNI RIGHT INC AXILLA; Future  2. Cough History of asthma. Anticipatory guidance provided. If symptoms worsen/persist, RTC for re-evaluation. - albuterol (PROVENTIL HFA;VENTOLIN HFA) 108 (90 Base) MCG/ACT inhaler; Inhale 2 puffs into the lungs every 4 (four) hours as needed for wheezing or shortness of breath (cough, shortness of breath or wheezing.).  Dispense: 1  Inhaler; Refill: 1    Return if symptoms worsen or fail to improve.   Fernande Brashelle S. India Jolin, PA-C [facility]

## 2017-07-02 NOTE — Patient Instructions (Signed)
     IF you received an x-ray today, you will receive an invoice from [facility]. Please contact [facility] at [phone] with questions or concerns regarding your invoice.   IF you received labwork today, you will receive an invoice from [facility]. Please contact [facility] at [phone] with questions or concerns regarding your invoice.   Our billing staff will not be able to assist you with questions regarding bills from these companies.  You will be contacted with the lab results as soon as they are available. The fastest way to get your results is to activate your My Chart account. Instructions are located on the last page of this paperwork. If you have not heard from us regarding the results in 2 weeks, please contact this office.     

## 2017-07-09 ENCOUNTER — Other Ambulatory Visit: Payer: Self-pay | Admitting: Physician Assistant

## 2017-07-09 DIAGNOSIS — N644 Mastodynia: Secondary | ICD-10-CM

## 2017-07-27 ENCOUNTER — Ambulatory Visit
Admission: RE | Admit: 2017-07-27 | Discharge: 2017-07-27 | Disposition: A | Discharge status: Home / Self Care | Payer: Managed Care, Other (non HMO) | Source: Ambulatory Visit | Attending: Physician Assistant | Admitting: Physician Assistant

## 2017-07-27 ENCOUNTER — Ambulatory Visit: Payer: Managed Care, Other (non HMO)

## 2017-07-27 DIAGNOSIS — N644 Mastodynia: Secondary | ICD-10-CM

## 2017-07-27 IMAGING — MG 2D DIGITAL DIAGNOSTIC UNILATERAL RIGHT MAMMOGRAM WITH CAD AND AD
2 series · 3 of 6 positions shown · non-contrast
Comparison: [DATE] and early

ADDENDUM: CORRECTED REPORT:
ADDENDUM: CORRECTED REPORT
Screening mammogram is recommended in [DATE].
CLINICAL DATA: Patient describes the deep achiness in the right
breast. She noted some bruising in the medial portion of the right
breast which has resolved. Patient has no recollection of trauma.

EXAM:
2D DIGITAL DIAGNOSTIC UNILATERAL RIGHT MAMMOGRAM WITH CAD AND
ADJUNCT TOMO

[R MLO synth-2D]
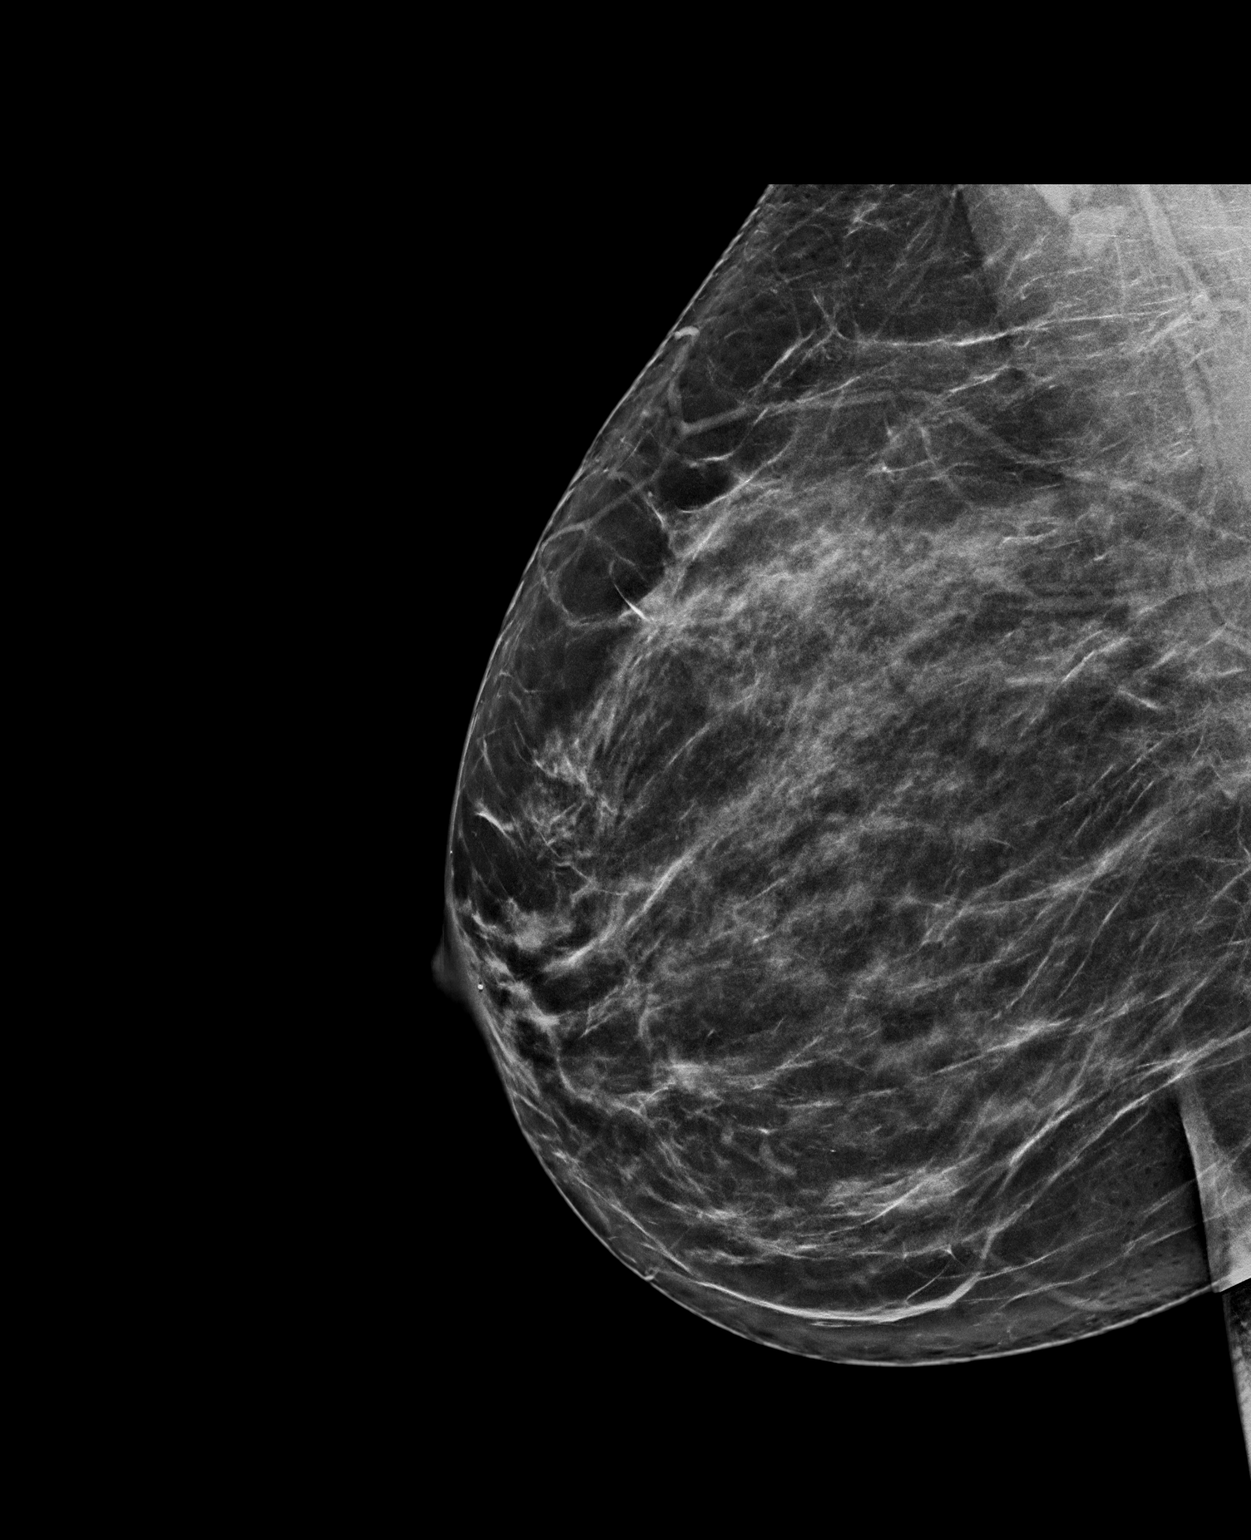

[R CC tomo · 2 of 74 frames shown]
[frame 24/74]
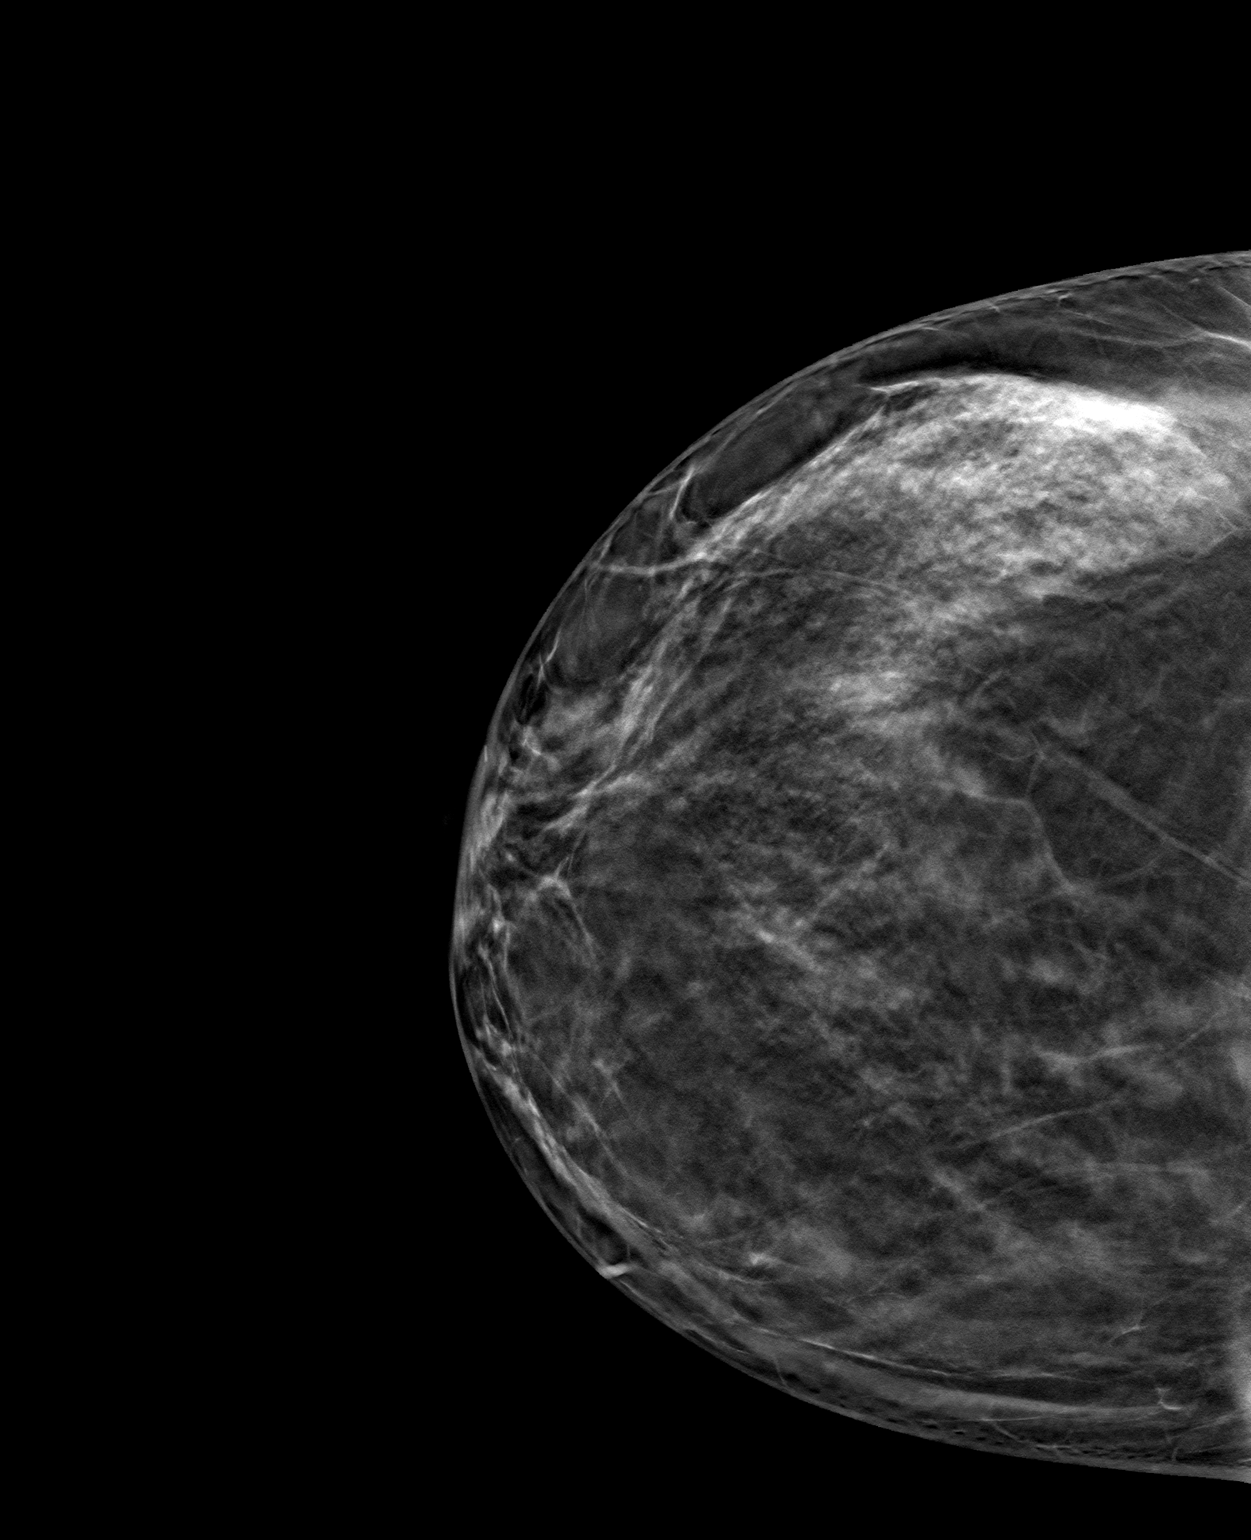
[frame 37/74]
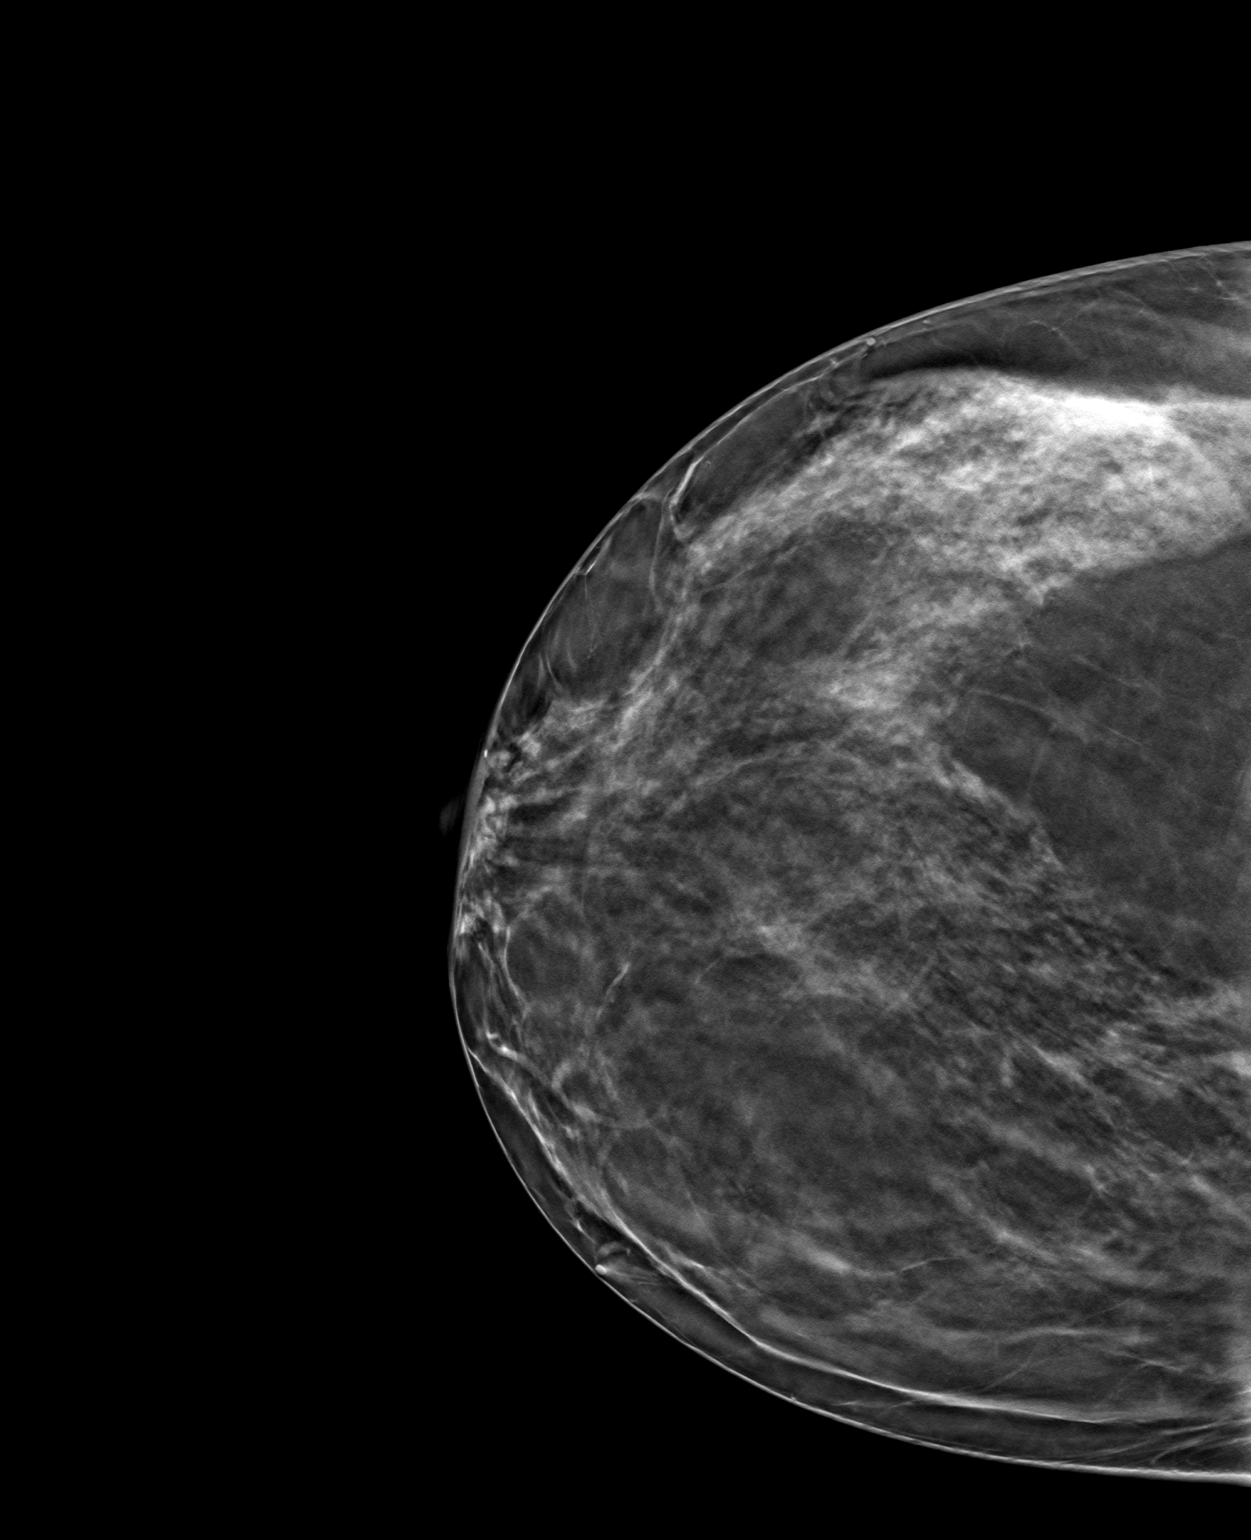

[3 of 6 positions shown; findings below may reference images not displayed]

ACR Breast Density Category c: The breast tissue is heterogeneously
dense, which may obscure small masses.
FINDINGS: No suspicious mass, distortion, or microcalcifications are
identified to suggest presence of malignancy.

Mammographic images were processed with CAD.
IMPRESSION: No mammographic evidence for malignancy.

RECOMMENDATION:
Screening mammogram is recommended in [DATE].

I have discussed the findings and recommendations with the patient.
Results were also provided in writing at the conclusion of the
visit. If applicable, a reminder letter will be sent to the patient
regarding the next appointment.

BI-RADS CATEGORY  1: Negative.

## 2017-09-11 ENCOUNTER — Other Ambulatory Visit: Payer: Self-pay

## 2017-09-11 ENCOUNTER — Encounter (HOSPITAL_COMMUNITY): Payer: Self-pay | Admitting: Emergency Medicine

## 2017-09-11 ENCOUNTER — Ambulatory Visit (HOSPITAL_COMMUNITY)
Admission: EM | Admit: 2017-09-11 | Discharge: 2017-09-11 | Disposition: A | Discharge status: Home / Self Care | Payer: Managed Care, Other (non HMO) | Attending: Internal Medicine | Admitting: Internal Medicine

## 2017-09-11 DIAGNOSIS — M25511 Pain in right shoulder: Secondary | ICD-10-CM | POA: Diagnosis not present

## 2017-09-11 MED ORDER — DEXAMETHASONE SODIUM PHOSPHATE 10 MG/ML IJ SOLN
INTRAMUSCULAR | Status: AC
  Filled 2017-09-11: qty 1

## 2017-09-11 MED ORDER — KETOROLAC TROMETHAMINE 30 MG/ML IJ SOLN
INTRAMUSCULAR | Status: AC
  Filled 2017-09-11: qty 1

## 2017-09-11 MED ORDER — DEXAMETHASONE SODIUM PHOSPHATE 10 MG/ML IJ SOLN
10.0000 mg | Freq: Once | INTRAMUSCULAR | Status: AC
  Administered 2017-09-11: 10 mg via INTRAMUSCULAR

## 2017-09-11 MED ORDER — KETOROLAC TROMETHAMINE 30 MG/ML IJ SOLN
30.0000 mg | Freq: Once | INTRAMUSCULAR | Status: AC
  Administered 2017-09-11: 30 mg via INTRAMUSCULAR

## 2017-09-11 MED ORDER — MELOXICAM 7.5 MG PO TABS: 7.5000 mg | ORAL_TABLET | Freq: Every day | ORAL | 0 refills | Status: DC

## 2017-09-11 NOTE — ED Provider Notes (Signed)
[facility]    CSN: [id number] Arrival date & time: 09/11/17  1928     History   Chief Complaint Chief Complaint  Patient presents with  . Shoulder Pain  . Eye Problem    HPI Ariana Walsh is a 47 y.o. female.   47 year old female comes in for 2 day history of right shoulder pain.  States that she has had this pain on and off for the past year, usually resolves on its own.  However, her right shoulder pain has been gradually worsening, and now with limited range of motion due to pain.  States the pain mostly on the shoulder but can radiate to the biceps.  Denies injury/trauma.  Took ibuprofen 200 mg without relief.  States that in the past, she has not needed to take anything for it.  Denies numbness, tingling.      Past Medical History:  Diagnosis Date  . Hernia, umbilical     Patient Active Problem List   Diagnosis Date Noted  . Umbilical hernia 12/30/2016  . BMI 27.0-27.9,adult 12/30/2016  . Dystrophic nail 12/30/2016    Past Surgical History:  Procedure Laterality Date  . DENTAL SURGERY      OB History    No data available       Home Medications    Prior to Admission medications   Medication Sig Start Date End Date Taking? Authorizing Provider  ibuprofen (ADVIL,MOTRIN) 200 MG tablet Take 200 mg by mouth every 6 (six) hours as needed.   Yes [provider]  meloxicam (MOBIC) 7.5 MG tablet Take 1 tablet (7.5 mg total) by mouth daily. 09/11/17   Belinda FisherYu, Dewayne Jurek V, PA-C    Family History Family History  Problem Relation Age of Onset  . Breast cancer Mother        post-menopausal, DCIS  . Endometrial cancer Mother        mid-late 1460's  . Hypertension Mother   . Hypertension Father   . Diabetes Father   . Heart disease Father        s/p 3V CABG age 48  . Mental illness Father        depression  . Arthritis Father        bilateral knee replacements  . Mental illness Brother        depression    Social History Social History    Tobacco Use  . Smoking status: Never Smoker  . Smokeless tobacco: Never Used  Substance Use Topics  . Alcohol use: Yes    Alcohol/week: 0.0 - 1.8 oz  . Drug use: No     Allergies   Latex   Review of Systems Review of Systems  Reason unable to perform ROS: See HPI as above.     Physical Exam Triage Vital Signs ED Triage Vitals  Enc Vitals Group     BP 09/11/17 2010 (!) 143/78     Pulse Rate 09/11/17 2010 73     Resp --      Temp 09/11/17 2010 98.2 F (36.8 C)     Temp Source 09/11/17 2010 Oral     SpO2 09/11/17 2010 100 %     Weight 09/11/17 2005 176 lb (79.8 kg)     Height --      Head Circumference --      Peak Flow --      Pain Score 09/11/17 2004 6     Pain Loc --      Pain Edu? --  Excl. in GC? --    No data found.  Updated Vital Signs BP (!) 143/78 (BP Location: Right Arm)   Pulse 73   Temp 98.2 F (36.8 C) (Oral)   Wt 176 lb (79.8 kg)   LMP 08/28/2017   SpO2 100%   BMI 28.84 kg/m   Physical Exam  Constitutional: She is oriented to person, place, and time. She appears well-developed and well-nourished. No distress.  HENT:  Head: Normocephalic and atraumatic.  Eyes: Conjunctivae, EOM and lids are normal. Pupils are equal, round, and reactive to light.  Neck: Normal range of motion. Neck supple. No spinous process tenderness and no muscular tenderness present. Normal range of motion present.  Cardiovascular: Normal rate, regular rhythm and normal heart sounds. Exam reveals no gallop and no friction rub.  No murmur heard. Pulmonary/Chest: Effort normal and breath sounds normal. She has no wheezes. She has no rales.  Musculoskeletal:  No tenderness on palpation of the spinous processes.  No tenderness on palpation of trapezius muscle.  Tenderness to palpation of anterior shoulder around the biceps tendon.  Tenderness on palpation of lateral shoulder as well.  Decreased active and passive range of motion.  Strength deferred.  Sensation intact and  equal bilaterally.  Grip strength normal.  Radial pulse 2+ and equal bilaterally.  Cap refill less than 2 seconds.   Neurological: She is alert and oriented to person, place, and time.  Skin: Skin is warm and dry.    UC Treatments / Results  Labs (all labs ordered are listed, but only abnormal results are displayed) Labs Reviewed - No data to display  EKG  EKG Interpretation None       Radiology No results found.  Procedures Procedures (including critical care time)  Medications Ordered in UC Medications  ketorolac (TORADOL) 30 MG/ML injection 30 mg (30 mg Intramuscular Given 09/11/17 2108)  dexamethasone (DECADRON) injection 10 mg (10 mg Intramuscular Given 09/11/17 2108)     Initial Impression / Assessment and Plan / UC Course  I have reviewed the triage vital signs and the nursing notes.  Pertinent labs & imaging results that were available during my care of the patient were reviewed by me and considered in my medical decision making (see chart for details).    Discussed possible tendinitis causing symptoms in the setting of atraumatic shoulder pain.  Toradol and Decadron injection in office today.  Start Mobic as directed.  Ice compress.  Follow-up with orthopedics for further evaluation if symptoms not improving.  Final Clinical Impressions(s) / UC Diagnoses   Final diagnoses:  Acute pain of right shoulder    ED Discharge Orders        Ordered    meloxicam (MOBIC) 7.5 MG tablet  Daily     09/11/17 2047       Belinda Fisher, PA-C 09/11/17 2111

## 2017-09-11 NOTE — ED Triage Notes (Signed)
No known injury

## 2017-09-11 NOTE — Discharge Instructions (Signed)
Start Mobic as directed.  Do not take ibuprofen (Advil, Motrin)/naproxen (Aleve) while on Mobic.  Ice compress of the shoulder. Rotate shoulder to keep it from getting stiff.  Follow-up with orthopedics if symptoms not improving.

## 2017-09-11 NOTE — ED Triage Notes (Signed)
C/o right shoulder limited ROM onset off and on x one year, radiates down arm and is "sore to touch"

## 2017-09-11 NOTE — ED Triage Notes (Signed)
Pt states while sitting in our waiting area left eye started "stinging and watering"

## 2017-09-13 ENCOUNTER — Ambulatory Visit (INDEPENDENT_AMBULATORY_CARE_PROVIDER_SITE_OTHER): Payer: Managed Care, Other (non HMO)

## 2017-09-13 ENCOUNTER — Ambulatory Visit: Payer: Managed Care, Other (non HMO) | Admitting: Physician Assistant

## 2017-09-13 ENCOUNTER — Other Ambulatory Visit: Payer: Self-pay

## 2017-09-13 ENCOUNTER — Encounter: Payer: Self-pay | Admitting: Physician Assistant

## 2017-09-13 VITALS — BP 130/78 | HR 84 | Resp 16 | Ht 65.5 in | Wt 177.2 lb

## 2017-09-13 DIAGNOSIS — M25511 Pain in right shoulder: Secondary | ICD-10-CM

## 2017-09-13 DIAGNOSIS — M5410 Radiculopathy, site unspecified: Secondary | ICD-10-CM

## 2017-09-13 IMAGING — DX DG SHOULDER 2+V*R*
3 series · 3 of 3 positions shown · non-contrast
Comparison: None.

CLINICAL DATA: Right shoulder pain.  No known injury.

EXAM:
RIGHT SHOULDER - 2+ VIEW

[shoulder ap]
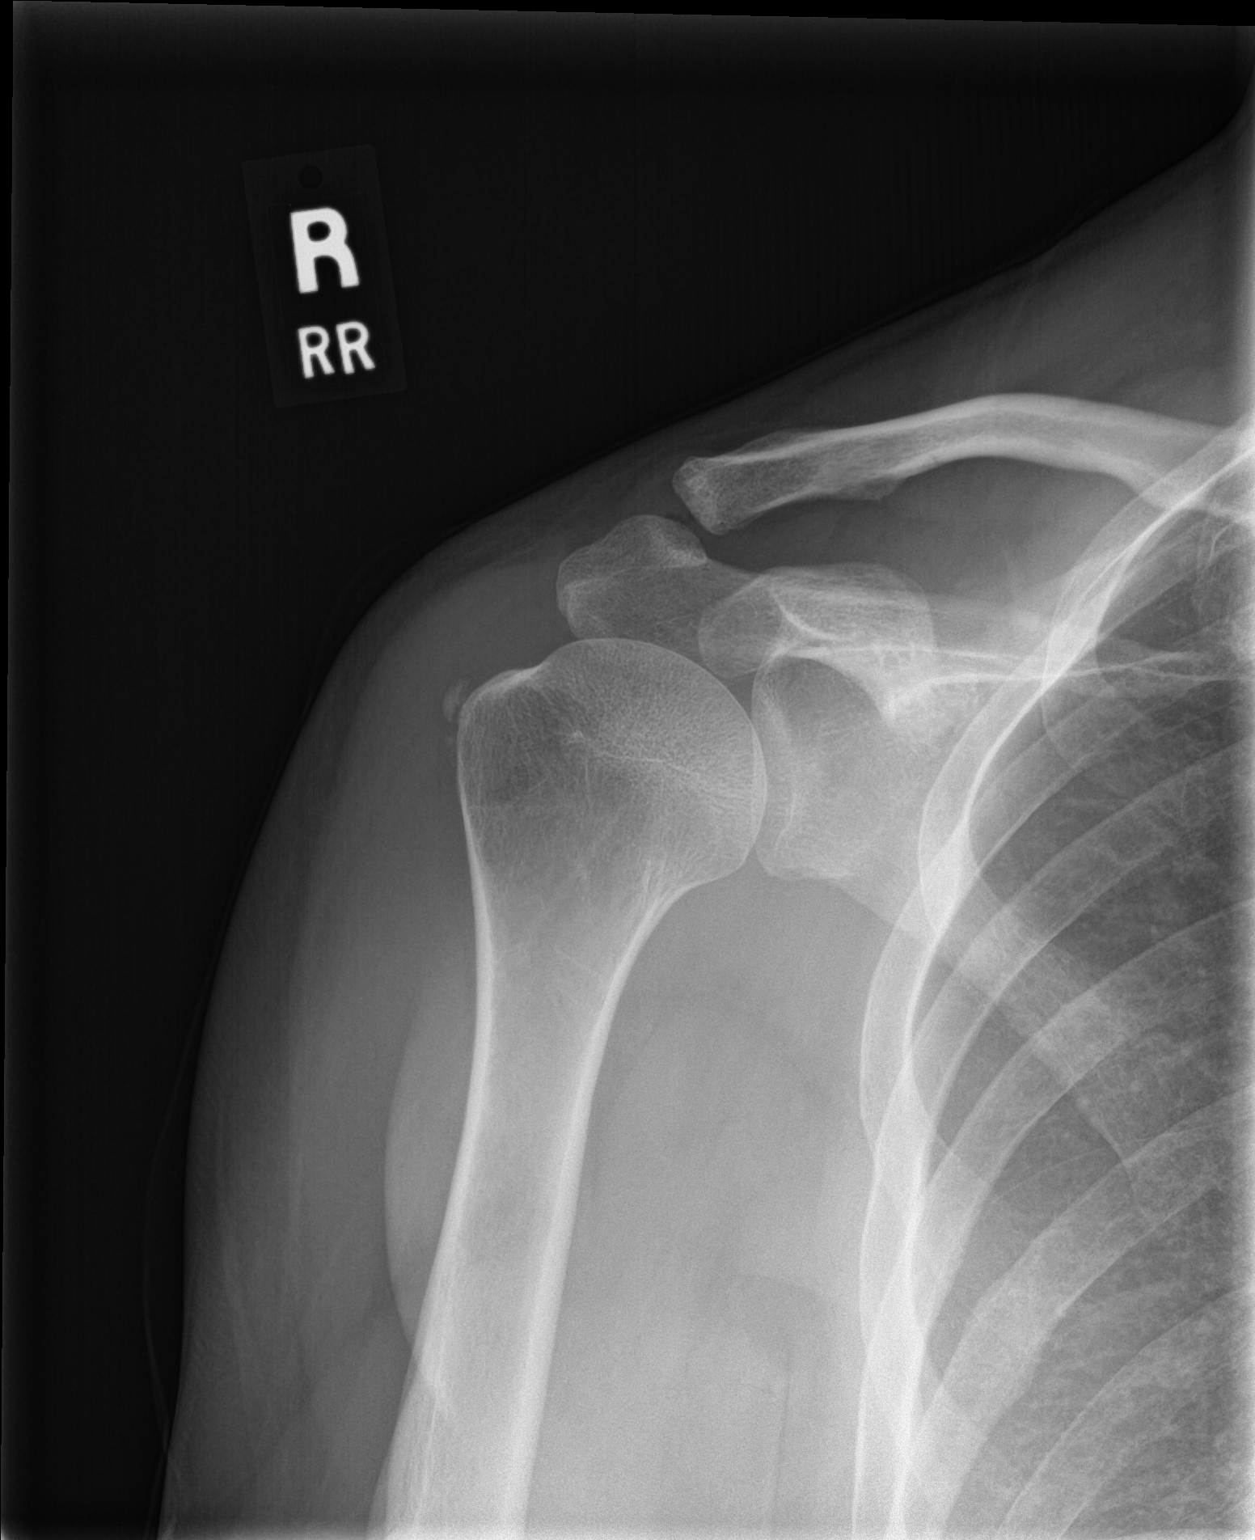

[shoulder y-view]
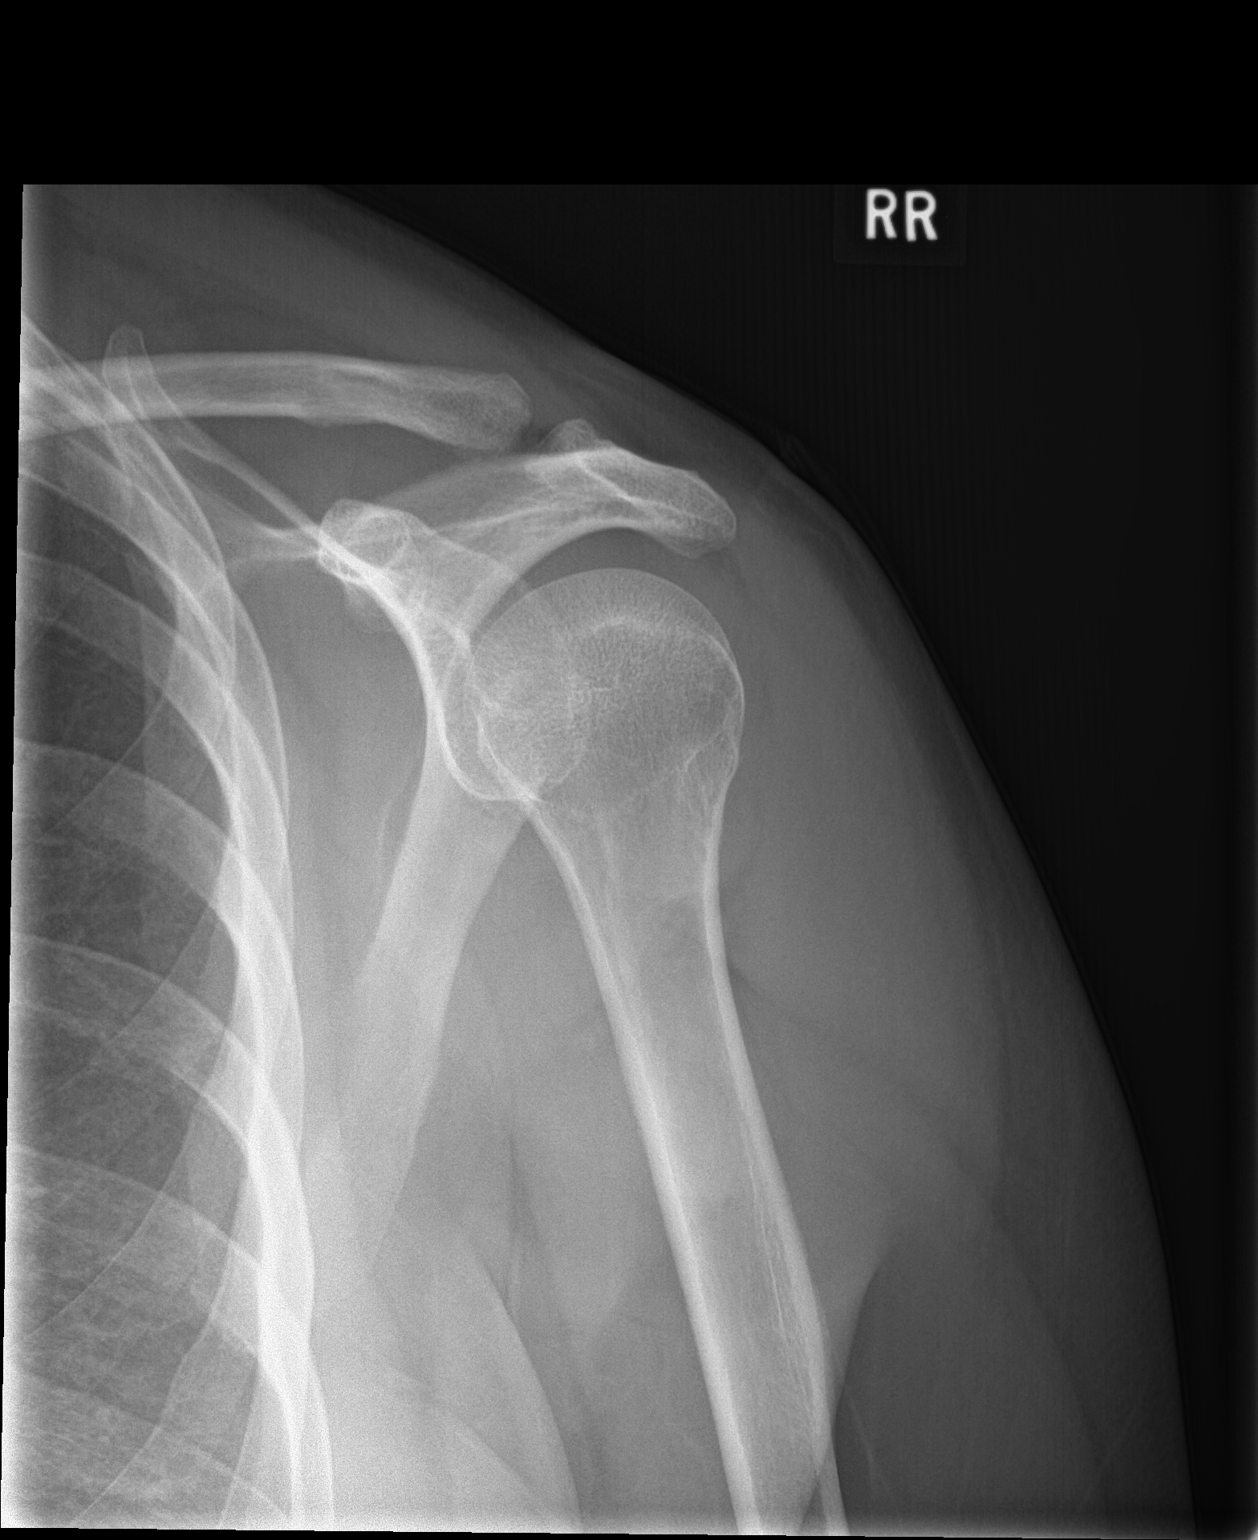

[shoulder axial]
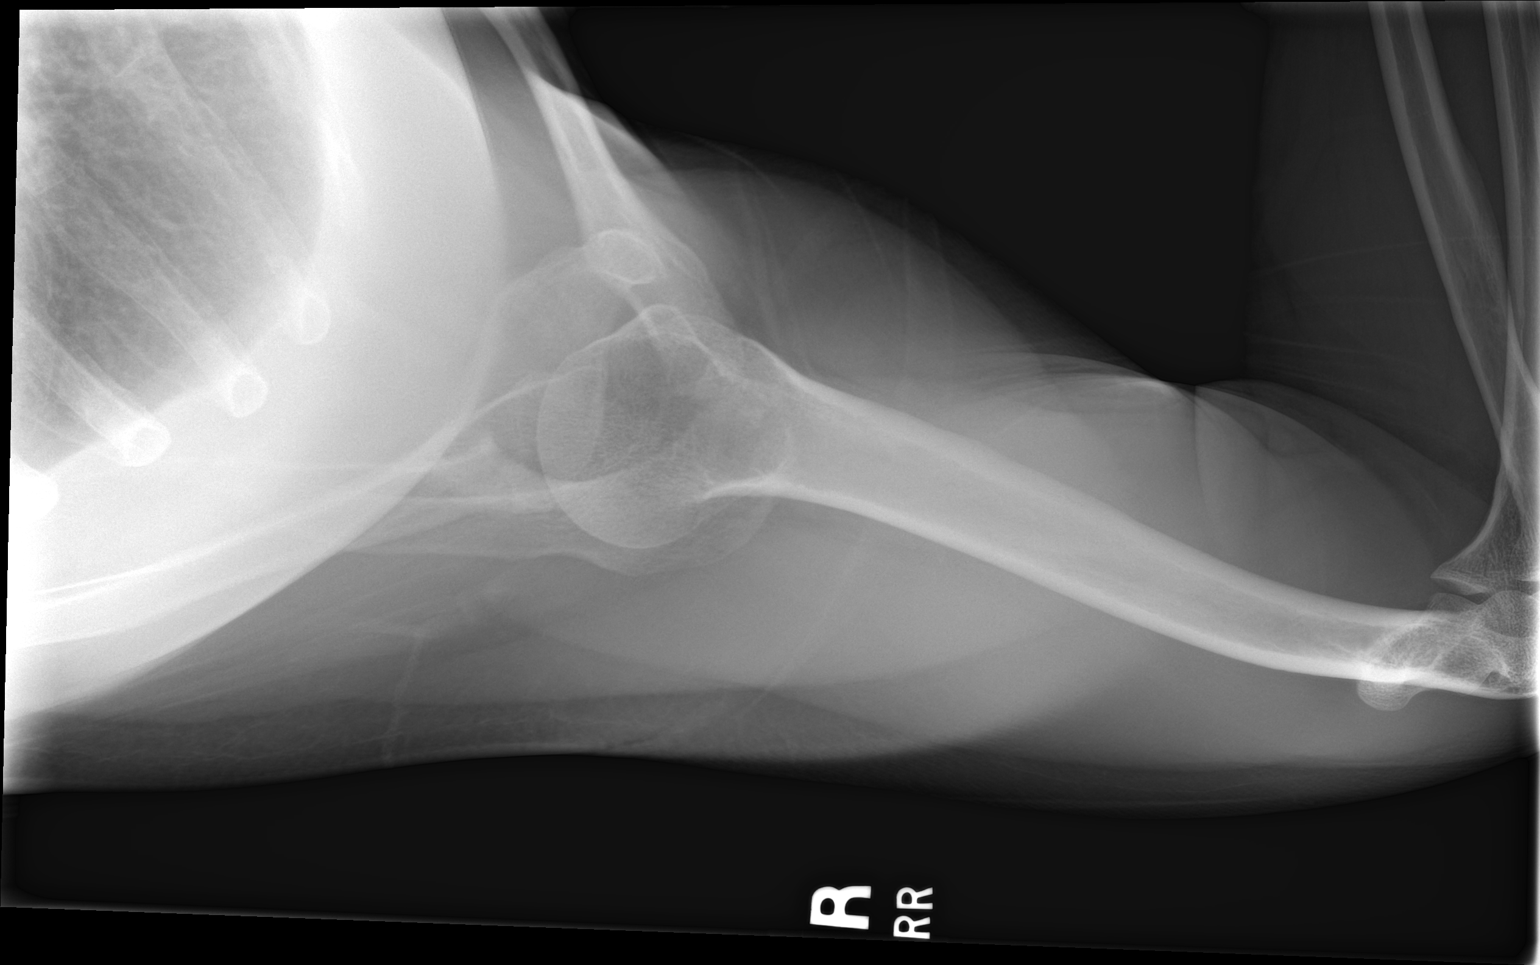

[3 of 3 positions shown; findings below may reference images not displayed]

FINDINGS: No acute bony or joint abnormality is identified. Small
calcification adjacent to the greater tuberosity could be due to
calcific rotator cuff tendinopathy or subacromial/subdeltoid
bursitis. Mild acromioclavicular osteoarthritis noted. Imaged right
lung is clear.
IMPRESSION: No acute abnormality.

Findings compatible with calcific rotator cuff tendinopathy or less
likely calcific subacromial/subdeltoid bursitis.

Mild acromioclavicular osteoarthritis.

## 2017-09-13 IMAGING — DX DG CERVICAL SPINE COMPLETE 4+V
5 series · 5 of 5 positions shown · non-contrast
Comparison: None.

CLINICAL DATA: Right shoulder pain.  Radiculopathy.

EXAM:
CERVICAL SPINE - COMPLETE 4+ VIEW

[c-spine obl (1 of 2)]
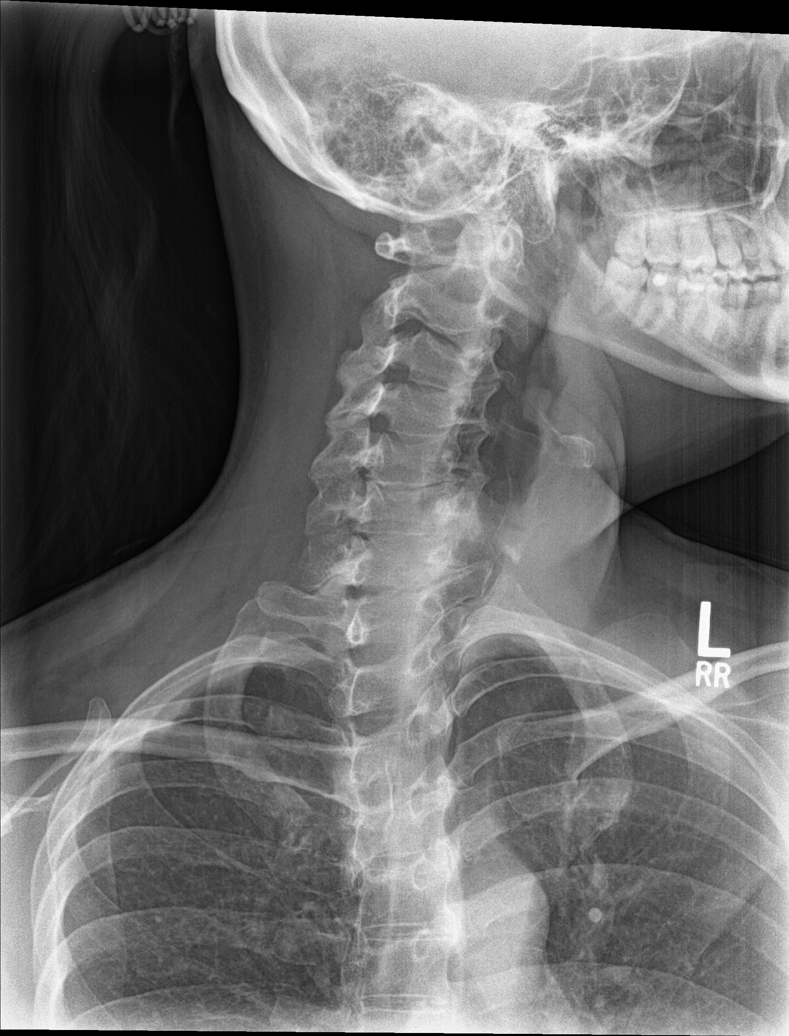

[c-spine obl (2 of 2)]
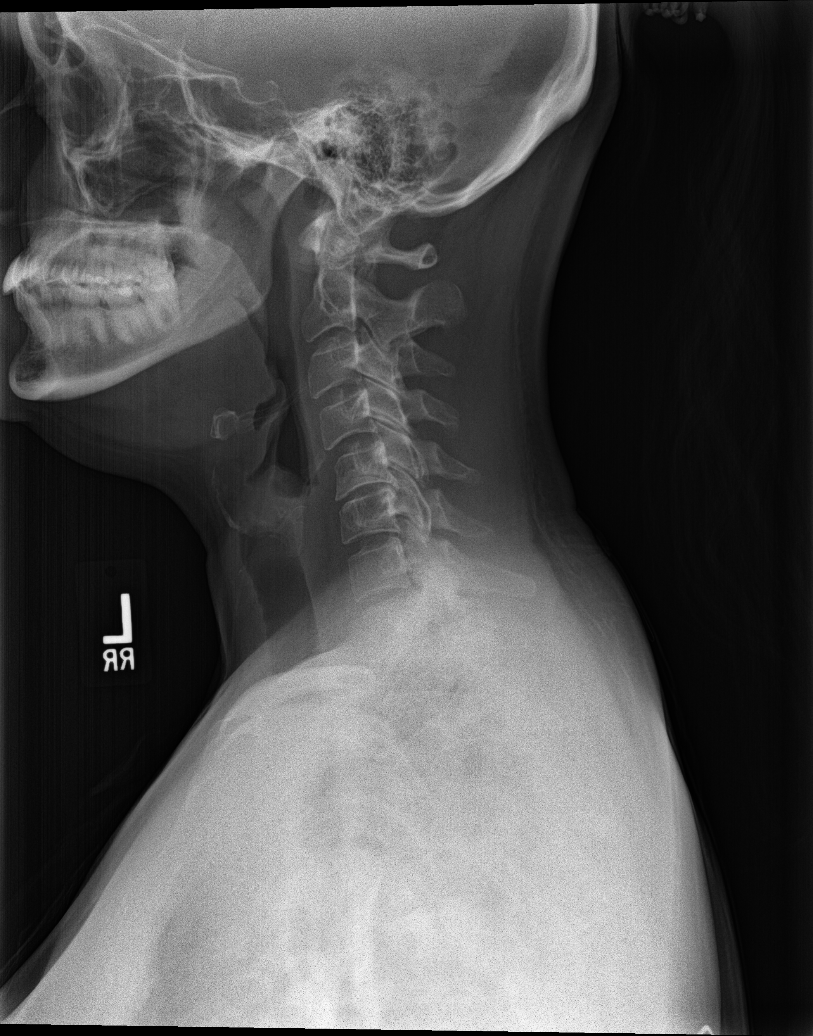

[c-spine ap]
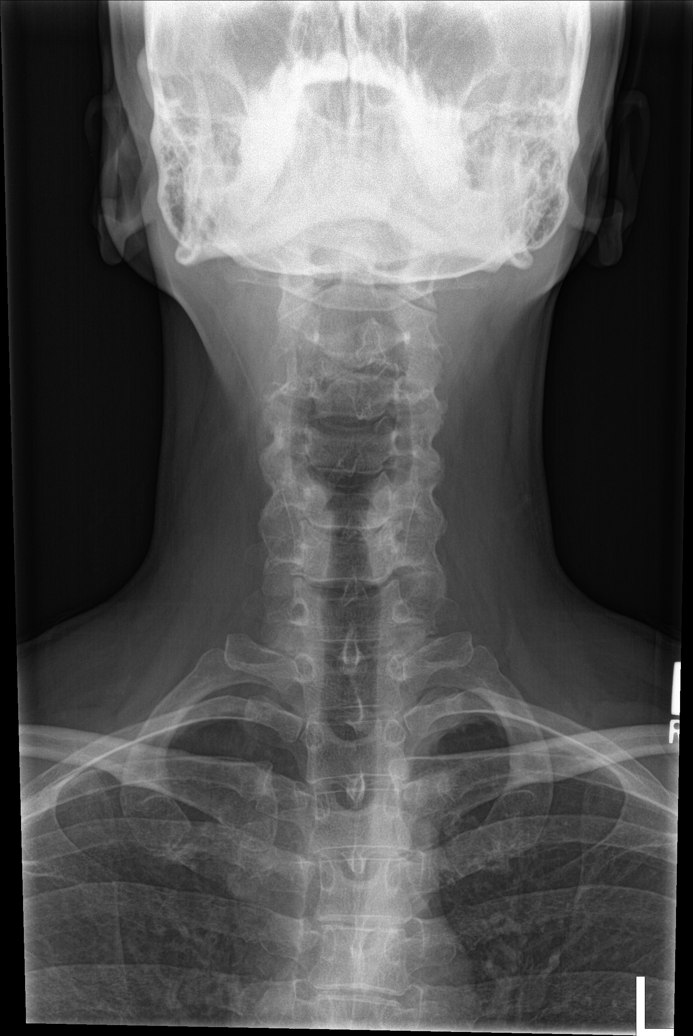

[c-spine open mouth]
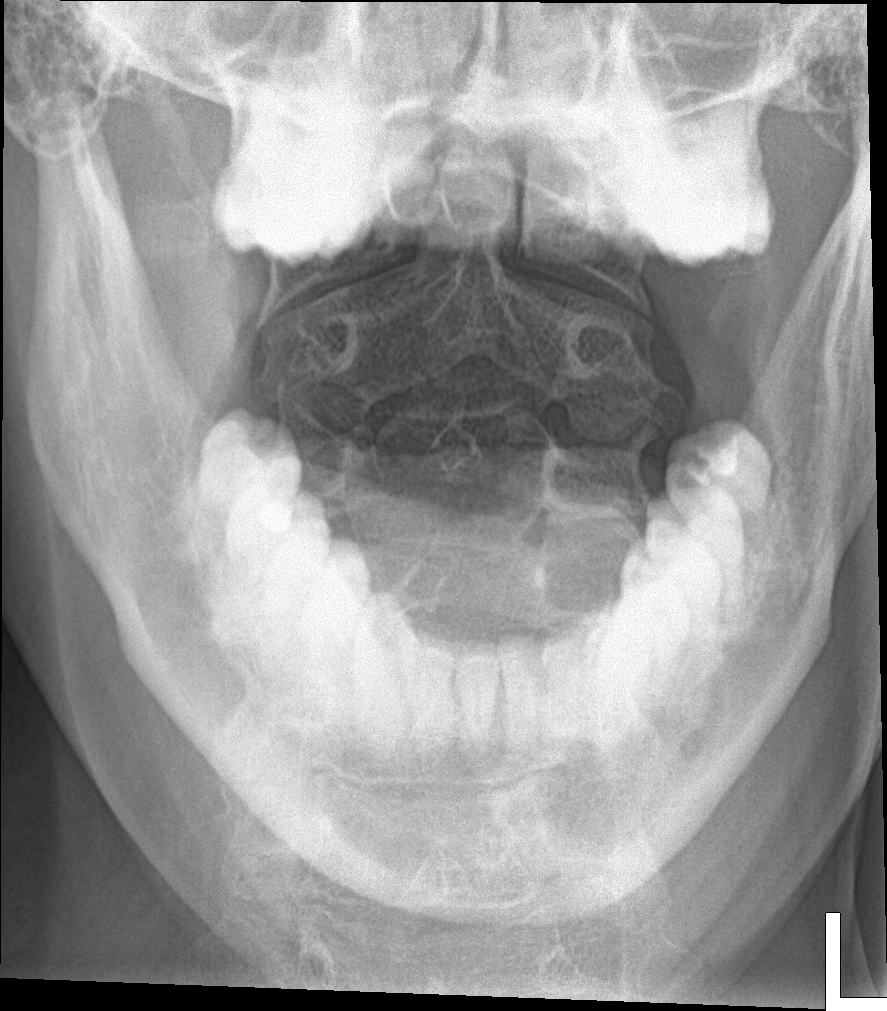

[[person_name]]
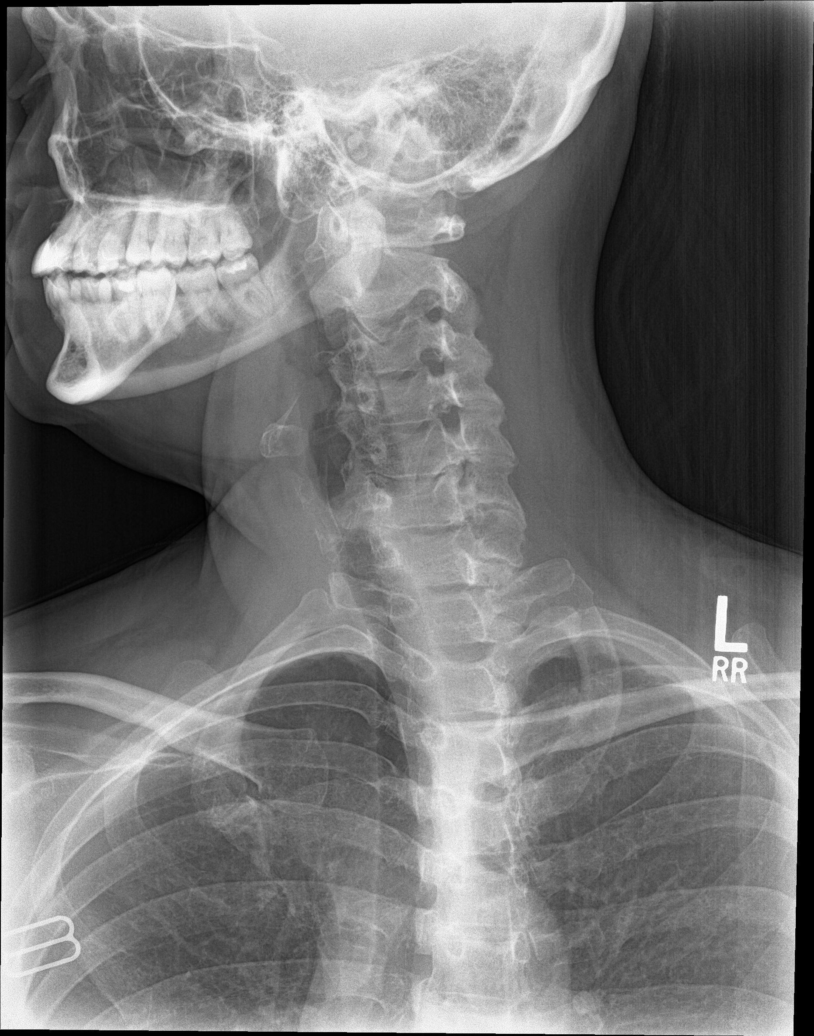

[5 of 5 positions shown; findings below may reference images not displayed]

FINDINGS: C5-6 degenerative disc narrowing with left more than right
uncovertebral spurring and foraminal narrowing. There is right-sided
uncovertebral spurring and foraminal narrowing at C6-7, potentially
accentuated by obliquity. No evidence of fracture or bone lesion. No
prevertebral thickening.
IMPRESSION: 1. C5-6 focal degenerative disc narrowing with left more than right
foraminal narrowing from uncovertebral spurs.
2. Right C6-7 uncovertebral ridging and foraminal narrowing.

## 2017-09-13 NOTE — Patient Instructions (Signed)
     IF you received an x-ray today, you will receive an invoice from [facility]. Please contact [facility] at [phone] with questions or concerns regarding your invoice.   IF you received labwork today, you will receive an invoice from [facility]. Please contact [facility] at [phone] with questions or concerns regarding your invoice.   Our billing staff will not be able to assist you with questions regarding bills from these companies.  You will be contacted with the lab results as soon as they are available. The fastest way to get your results is to activate your My Chart account. Instructions are located on the last page of this paperwork. If you have not heard from us regarding the results in 2 weeks, please contact this office.     

## 2017-09-13 NOTE — Progress Notes (Signed)
Subjective:    Patient ID: Ariana Walsh, female    DOB: 04/15/1971, 47 y.o.   MRN: [id number]   Chief Complaint  Patient presents with  . Shoulder Pain    right shoulder limited movement, off/on for a year     HPI Patient presents for follow up of right shoulder pain that was first evaluated in the urgent care two days ago (09/11/2017.) Patient went to urgent care on 09/11/2017 for acute pain of the right shoulder that had been occurring for over a year but had flared up 2 days prior (09/09/2017.)  No plain films were taken at the urgent care. Her pain level at the urgent care was 8/10. At the urgent care, she was given Toradol IM and Decadron IM. She was given a prescription for Meloxicam, so she could take it at home for pain. Today, she is following up on her shoulder pain in a primary care setting. Patient states that the pain today is reduced to a 4 or 5/10. The location of the pain is still in the right shoulder and radiates down the right arm. On 09/11/2016, patient states that she had three fingers (right 3rd, 4th and fifth phalanges,) with a burning/tingling sensation in her right arm. 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 Patient states that pain is exacerbated by turning a doorknob, reaching into back seat, and putting her right arm into coat. She describes the pain as feeling like a "sharp twinge."   Patient Active Problem List   Diagnosis Date Noted  . Umbilical hernia 12/30/2016  . BMI 27.0-27.9,adult 12/30/2016  . Dystrophic nail 12/30/2016   Allergies  Allergen Reactions  . Latex    Prior to Admission medications   Medication Sig Start Date End Date Taking? Authorizing Provider  ibuprofen (ADVIL,MOTRIN) 200 MG tablet Take 200 mg by mouth every 6 (six) hours as needed.    [provider]    meloxicam (MOBIC) 7.5 MG tablet Take 1 tablet (7.5 mg total) by mouth daily. 09/11/17   Belinda Fisher, PA-C   Past Medical History:  Diagnosis Date  . Hernia, umbilical    Social History   Socioeconomic History  . Marital status: Married    Spouse name: Martina Sinner  . Number of children: 1  . Years of education: MFA in Directing  . Highest education level: Not on file  Social Needs  . Financial resource strain: Not on file  . Food insecurity - worry: Not on file  . Food insecurity - inability: Not on file  . Transportation needs - medical: Not on file  . Transportation needs - non-medical: Not on file  Occupational History  . Occupation: Adjunct professor     Comment: Radio broadcast assistant   Tobacco Use  . Smoking status: Never Smoker  . Smokeless tobacco: Never Used  Substance and Sexual Activity  . Alcohol use: Yes    Alcohol/week: 0.0 - 1.8 oz  . Drug use: No  . Sexual activity: Yes    Partners: Male    Birth control/protection: Surgical    Comment: Partner has vasectomy   Other Topics Concern  . Not on file  Social History Narrative   Estranged from husband since 2011.   Lives with her son.   Her son was conceived by IVF after she and her husband split.  Breast fed x 4 years.    Family History  Problem Relation Age of Onset  . Breast cancer Mother        post-menopausal, DCIS  . Endometrial cancer Mother        mid-late 70's  . Hypertension Mother   . Hypertension Father   . Diabetes Father   . Heart disease Father        s/p 3V CABG age 34  . Mental illness Father        depression  . Arthritis Father        bilateral knee replacements  . Mental illness Brother        depression   Past Surgical History:  Procedure Laterality Date  . DENTAL SURGERY    . IVF     egg collection    Review of Systems  Constitutional: Positive for activity change. Negative for appetite change and fatigue.  Respiratory: Negative.  Negative for cough, chest tightness and  shortness of breath.   Cardiovascular: Negative.  Negative for chest pain and palpitations.  Musculoskeletal: Positive for arthralgias, myalgias and neck pain. Negative for back pain and neck stiffness.  Skin: Negative.  Negative for rash and wound.  Neurological: Negative for dizziness, numbness (Parasthesias present down right arm into right pinky, index and pointer fingers.) and headaches.  Psychiatric/Behavioral: Positive for sleep disturbance.       Disturbed mood from loss of ROM in arm.       Objective:   Physical Exam  Constitutional: She is oriented to person, place, and time. She appears well-developed and well-nourished.  BP 130/78   Pulse 84   Resp 16   Ht 5' 5.5" (1.664 m)   Wt 177 lb 3.2 oz (80.4 kg)   LMP 08/28/2017   SpO2 99%   BMI 29.04 kg/m   HENT:  Head: Normocephalic and atraumatic.  Right Ear: External ear normal.  Left Ear: External ear normal.  Nose: Nose normal.  Mouth/Throat: Oropharynx is clear and moist.  Eyes: Conjunctivae and EOM are normal. Pupils are equal, round, and reactive to light.  Neck: Normal range of motion. Neck supple.  Cardiovascular: Normal rate, regular rhythm, normal heart sounds and intact distal pulses. Exam reveals no gallop and no friction rub.  No murmur heard. Pulmonary/Chest: Effort normal and breath sounds normal.  Musculoskeletal:  Right shoulder pain with resistance. Pain on palpation over anterior aspect of humeral head. ROM decreased, can only lift right arm in forward direction to 70 degrees away from trunk.   Neurological: She is alert and oriented to person, place, and time. She has normal reflexes.  Skin: Skin is warm and dry.  Psychiatric: She has a normal mood and affect. Her behavior is normal. Judgment and thought content normal.   Wt Readings from Last 3 Encounters:  09/13/17 177 lb 3.2 oz (80.4 kg)  09/11/17 176 lb (79.8 kg)  07/02/17 170 lb 3.2 oz (77.2 kg)      Assessment & Plan:   1. Right shoulder  pain, unspecified chronicity Patient presents for right shoulder pain that limits her range of motion that has lasted since 09/09/2017. She has had similar bouts over the past year, but she says they usually resolve with movement and this "feels different." She went to the urgent care for this pain on 09/11/2017, where she was given IM Toradol, IM Decadron, and oral Meloxicam to take home. She is following up with [place] at primary care for further assessment. Plain films were  taken of her cervical spine and right shoulder to evaluate for rotator cuff tendonitis, bursitis, possible bony changes (osteoarthritis,) or nerve impingement. 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   - DG Cervical Spine Complete; Future - DG Shoulder Right; Future  2. Radiculopathy of arm Patient's complaint of pain that radiates down the right arm with a burning/tingling sensation in her right 3rd, 4th, and 5th phalanges is indicative of possible nerve impingement or compression. Patient's complaint of her arm feeling "lifeless" on 09/11/2016 further indicates possible impingement or compression of nerves originating in the cervical spine. A plain film of the cervical spine was ordered to further evaluate this.  - DG Cervical Spine Complete; Future  Patient will be contacted with the results of these plain films. The results will guide the course of management to follow up with either us (PCP,) or an orthopedic specialist within the next 2-4 weeks.

## 2017-09-13 NOTE — Progress Notes (Signed)
.     Patient ID: Ariana Walsh, female    DOB: 1970/07/21, 47 y.o.   MRN: [id number]  PCP: Porfirio Oar, PA-C  Chief Complaint  Patient presents with  . Shoulder Pain    right shoulder limited movement, off/on for a year     Subjective:   Presents for evaluation of right shoulder pain.  She is right-hand dominant.  Right shoulder pain intermittently for the past year.  For the past 5 days, her pain has been worse.  She presented to an urgent care with persistent pain rated 8 out of 10.  She received Toradol and Decadron IM and given a prescription for meloxicam.  No radiographs were performed.  Today her pain is improved, 4-5 out of 10. Pain radiates down the right arm and she describes a burning or tingling sensation in the right arm extending to the third fourth and fifth fingers. Increased pain with certain activities: Turning the doorknob, reaching into the back seat of her vehicle, reaching behind her to put her arm into her jacket.  These activities cause "sharp twinge" pain.  She does not recall any specific trauma or injury and denies doing repetitive motions with the upper body.  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    Review of Systems Constitutional: Positive for activity change. Negative for appetite change and fatigue.  Respiratory: Negative.  Negative for cough, chest tightness and shortness of breath.   Cardiovascular: Negative.  Negative for chest pain and palpitations.  Musculoskeletal: Positive for arthralgias, myalgias and neck pain. Negative for back pain and neck stiffness.  Skin: Negative.  Negative for rash and wound.  Neurological: Negative for dizziness, numbness (Parasthesias present down right arm into right pinky, index and pointer fingers.) and headaches.  Psychiatric/Behavioral: Positive for sleep disturbance.       Disturbed mood from loss of ROM in arm.       Patient Active Problem List   Diagnosis Date Noted    . Umbilical hernia 12/30/2016  . BMI 27.0-27.9,adult 12/30/2016  . Dystrophic nail 12/30/2016     Prior to Admission medications   Medication Sig Start Date End Date Taking? Authorizing Provider  ibuprofen (ADVIL,MOTRIN) 200 MG tablet Take 200 mg by mouth every 6 (six) hours as needed.    [provider]  meloxicam (MOBIC) 7.5 MG tablet Take 1 tablet (7.5 mg total) by mouth daily. 09/11/17   Belinda Fisher, PA-C     Allergies  Allergen Reactions  . Latex        Objective:  Physical Exam  Constitutional: She is oriented to person, place, and time. She appears well-developed and well-nourished. She is active and cooperative. No distress.  BP 130/78   Pulse 84   Resp 16   Ht 5' 5.5" (1.664 m)   Wt 177 lb 3.2 oz (80.4 kg)   LMP 08/28/2017   SpO2 99%   BMI 29.04 kg/m    Eyes: Conjunctivae are normal.  Pulmonary/Chest: Effort normal.  Musculoskeletal:       Right shoulder: She exhibits decreased range of motion (Unable to extend above 90 degrees anteriorly and with abduction.), tenderness (generalized, worst anteriorly), bony tenderness and pain. She exhibits no swelling, no effusion, no crepitus, no deformity, no laceration, no spasm, normal pulse and normal strength.       Right elbow: Normal.      Cervical back: Normal.       Thoracic back: Normal.  Right upper arm: Normal.       Right forearm: Normal.       Right hand: Normal. Normal sensation noted. Normal strength noted.  Neurological: She is alert and oriented to person, place, and time.  Psychiatric: She has a normal mood and affect. Her speech is normal and behavior is normal.        Assessment & Plan:   1. Right shoulder pain, unspecified chronicity 2. Radiculopathy of arm Await radiographs.  Continue meloxicam, has provided benefit thus far.  Arrange follow-up based on radiograph results.  She may benefit from physical therapy, may need specialty referral or additional imaging. - DG Cervical Spine  Complete; Future - DG Shoulder Right; Future   Return if symptoms worsen or fail to improve, and pending radiographs results.   Fernande Brashelle S. Stefanny Pieri, PA-C Primary [facility] at [facility]

## 2017-09-15 ENCOUNTER — Telehealth: Payer: Self-pay

## 2017-09-15 NOTE — Telephone Encounter (Signed)
Copied from CRM [id number]. Topic: Inquiry >> Sep 14, 2017 12:01 PM Alexander BergeronBarksdale, Harvey B wrote: Reason for CRM: Pt called and states she was called about her results from imaging yesterday but she was asked the question if she had anyone in mind on who to be referred to-to look @ her shoulder, pt has some physicians in mind and would like to speak to her pcp about this, contact pt to advise and to get a referral started

## 2017-09-15 NOTE — Telephone Encounter (Signed)
Phone call to patient. Per signed release, left detailed message for patient to call back.   When patient calls back, which orthopedic doctor/practice would she like to see? Please note this so referral can be placed.

## 2017-09-20 NOTE — Progress Notes (Unsigned)
Pt would like to see dr Rennis ChrisSupple after results of xray.

## 2017-10-13 ENCOUNTER — Encounter: Payer: Self-pay | Admitting: Physician Assistant

## 2018-02-17 ENCOUNTER — Encounter: Payer: Self-pay | Admitting: Family Medicine

## 2018-02-17 ENCOUNTER — Ambulatory Visit: Payer: Managed Care, Other (non HMO) | Admitting: Family Medicine

## 2018-02-17 ENCOUNTER — Other Ambulatory Visit: Payer: Self-pay

## 2018-02-17 VITALS — BP 122/80 | HR 67 | Temp 98.4°F | Ht 65.5 in | Wt 173.0 lb

## 2018-02-17 DIAGNOSIS — M7581 Other shoulder lesions, right shoulder: Secondary | ICD-10-CM

## 2018-02-17 DIAGNOSIS — N951 Menopausal and female climacteric states: Secondary | ICD-10-CM | POA: Insufficient documentation

## 2018-02-17 DIAGNOSIS — M7711 Lateral epicondylitis, right elbow: Secondary | ICD-10-CM

## 2018-02-17 DIAGNOSIS — Z1231 Encounter for screening mammogram for malignant neoplasm of breast: Secondary | ICD-10-CM

## 2018-02-17 DIAGNOSIS — Z1239 Encounter for other screening for malignant neoplasm of breast: Secondary | ICD-10-CM

## 2018-02-17 NOTE — Progress Notes (Signed)
Subjective  CC:  Chief Complaint  Patient presents with  . Establish Care    Transfer from [facility], saw Ariana Walsh, Mammogram ordered.   . Shoulder Pain    Pain has subsided now, doing massage which is helping   . Skin Problem    Mole under Right Breast    HPI: Ariana Walsh is a 47 y.o. female who presents to [facility] today to establish care with me as a new patient.  Overall healthy with last cpe a year ago. Avoids medications if possible. Needs form for work completed. 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 Excited  Single but has a boyfriend; 887 yo son   She has the following concerns or needs:  Dealing with right shoulder pain since march; I reviewed notes. Feeling better now. Certain movements like reaching backwards cause pain. Has used some nsaids. Using massage therapist to help with this and some neck pain; improving. Reviewed neck and shoulder xrays - some arthritic changes in cervical spine but normal shoulder. No radicular sxs or weakness  Now with right elbow pain over the last month. ? Maybe from compensating due to shoulder pain. Pain with certain mvt lateral elbow w/o locking, swelling, redness or injury. No wrist or hand pain  Has irritating mole on mid chest;   Umbilical hernia: occ pain. Has seen general surgeon but would like to postpone surgery  Perimenopausal changes: missed menses here and there; some occ hot flushes. No change in sleep or mood. No vaginal dryness. Mom with breast and uterine cancer - she had taken hrt for years so pt would like to avoid hormones if possible. sxs are now manageable.   Ros + for aches and pains: hips, knees etc on occ  HM: due mammogram; reports nl pap smear last year and [facility].   Assessment  1. Perimenopausal symptoms   2. Breast cancer screening   3. Rotator cuff tendonitis, right   4. Lateral epicondylitis of right elbow        Plan   Counseling done on  perimenopause. waitful watching for now.  mammo ordered.  Care of RCT and tennis elbow discussed. RICE, rest, massage; return if not improving. Add nsaid x 1-2 weeks.   Follow up:  Return in about 6 months (around 08/20/2018) for complete physical. Orders Placed This Encounter  Procedures  . MM Digital Screening   No orders of the defined types were placed in this encounter.    Dep[facility] screen [facility] 2[facility] 09/13/2017 07/02/2017 12/30/2016  Decreased Interest 0 0 0  Down, Depressed, Hopeless 0 0 0  PHQ - 2 Score 0 0 0    We updated and reviewed the patient's past history in detail and it is documented below.  Patient Active Problem List   Diagnosis Date Noted  . Perimenopausal symptoms 02/17/2018  . Umbilical hernia 12/30/2016    Dr. Rayburn MaBlackmon. Patient is waiting for a better time to have the surgery.   Marland Kitchen. BMI 27.0-27.9,adult 12/30/2016  . 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    Health Maintenance  Topic Date Due  . MAMMOGRAM  01/10/2018  . INFLUENZA VACCINE  03/04/2018 (Originally 02/09/2018)  . PAP SMEAR  09/10/2019  . TETANUS/TDAP  12/10/2021  . HIV Screening  Completed    There is no immunization history on file for this patient. Current Meds  Medication Sig  . TERBINAFINE EX Apply topically.    Allergies: Patient is allergic to  latex. Past Medical History Patient  has a past medical history of Hernia, umbilical. Past Surgical History Patient  has a past surgical history that includes Dental surgery and IVF. Family History: Patient family history includes Arthritis in her father; Breast cancer in her mother; Diabetes in her father; Endometrial cancer in her mother; Heart disease in her father; Hypertension in her father and mother; Mental illness in her brother and father. Social History:  Patient  reports that she has never smoked. She has never used smokeless tobacco. She reports that she drinks alcohol. She reports that she does not use drugs.  Review of  Systems: Constitutional: negative for fever or malaise Ophthalmic: negative for photophobia, double vision or loss of vision Cardiovascular: negative for chest pain, dyspnea on exertion, or new LE swelling Respiratory: negative for SOB or persistent cough Gastrointestinal: negative for abdominal pain, change in bowel habits or melena Genitourinary: negative for dysuria or gross hematuria Musculoskeletal: negative for new gait disturbance or muscular weakness Integumentary: negative for new or persistent rashes Neurological: negative for TIA or stroke symptoms Psychiatric: negative for SI or delusions Allergic/Immunologic: negative for hives  Patient Care Team    Relationship Specialty Notifications Start End  [name], MD PCP - General Family Medicine  02/17/18   [name], MD Consulting Physician General Surgery  12/30/16     Objective  Vitals: BP 122/80   Pulse 67   Temp 98.4 F (36.9 C)   Ht 5' 5.5" (1.664 m)   Wt 173 lb (78.5 kg)   LMP 02/05/2018   SpO2 98%   BMI 28.35 kg/m  General:  Well developed, well nourished, no acute distress  Psych:  Alert and oriented,normal mood and affect HEENT:  Normocephalic, atraumatic, non-icteric sclera, PERRL, oropharynx is without mass or exudate, supple neck without adenopathy, mass or thyromegaly Cardiovascular:  RRR without gallop, rub or murmur, nondisplaced PMI Respiratory:  Good breath sounds bilaterally, CTAB with normal respiratory effort Gastrointestinal: normal bowel sounds, soft, non-tender, no noted masses. No HSM MSK: no deformities, contusions. Joints are without erythema or swelling + empty can testing and pain with int rotation and abduction; no impingement. + ttp anteiorly Elbow: lateral epicondyle ttp and pain with resisted supination. Nl wrist.  Skin:  Warm, no rashes or suspicious lesions noted Neurologic:    Mental status is normal. Gross motor and sensory exams are normal. Normal gait   Commons side  effects, risks, benefits, and alternatives for medications and treatment plan prescribed today were discussed, and the patient expressed understanding of the given instructions. Patient is instructed to call or message via MyChart if he/she has any questions or concerns regarding our treatment plan. No barriers to understanding were identified. We discussed Red Flag symptoms and signs in detail. Patient expressed understanding regarding what to do in case of urgent or emergency type symptoms.   Medication list was reconciled, printed and provided to the patient in AVS. Patient instructions and summary information was reviewed with the patient as documented in the AVS. This note was prepared with assistance of Dragon voice recognition software. Occasional wrong-word or sound-a-like substitutions may have occurred due to the inherent limitations of voice recognition software

## 2018-02-17 NOTE — Patient Instructions (Addendum)
Please return at your convenience for your annual complete physical; please come fasting.   It was a pleasure meeting you today! Thank you for choosing [place] to meet your healthcare needs! I truly look forward to working with you. If you have any questions or concerns, please send me a message via Mychart or call the office at [phone].  We will call you with information regarding your referral appointment. Mammogram. If you do not hear from [place] within the next 2 weeks, please let me know. It can take 1-2 weeks to get appointments set up with the specialists.    Tennis Elbow Tennis elbow (lateral epicondylitis) is inflammation of the outer tendons of your forearm close to your elbow. Your tendons attach your muscles to your bones. The outer tendons of your forearm are used to extend your wrist, and they attach on the outside part of your elbow. Tennis elbow is often found in people who play tennis, but anyone may get the condition from repeatedly extending the wrist or turning the forearm. What are the causes? This condition is caused by repeatedly extending your wrist and using your hands. It can result from sports or work that requires repetitive forearm movements. Tennis elbow may also be caused by an injury. What increases the risk? You have a higher risk of developing tennis elbow if you play tennis or another racquet sport. You also have a higher risk if you frequently use your hands for work. This condition is also more likely to develop in:  Musicians.  Carpenters, painters, and plumbers.  Cooks.  Cashiers.  People who work in [employer].  Holiday representative workers.  Butchers.  People who use computers.  What are the signs or symptoms? Symptoms of this condition include:  Pain and tenderness in your forearm and the outer part of your elbow. You may only feel the pain when you use your arm, or you may feel it even when you are not using your arm.  A burning feeling that runs from  your elbow through your arm.  Weak grip in your hands.  How is this diagnosed? This condition may be diagnosed by medical history and physical exam. You may also have other tests, including:  X-rays.  MRI.  How is this treated? Your health care provider will recommend lifestyle adjustments, such as resting and icing your arm. Treatment may also include:  Medicines for inflammation. This may include shots of cortisone if your pain continues.  Physical therapy. This may include massage or exercises.  An elbow brace.  Surgery may eventually be recommended if your pain does not go away with treatment. Follow these instructions at home: Activity  Rest your elbow and wrist as directed by your health care provider. Try to avoid any activities that caused the problem until your health care provider says that you can do them again.  If a physical therapist teaches you exercises, do all of them as directed.  If you lift an object, lift it with your palm facing upward. This lowers the stress on your elbow. Lifestyle  If your tennis elbow is caused by sports, check your equipment and make sure that: ? You are using it correctly. ? It is the best fit for you.  If your tennis elbow is caused by work, take breaks frequently, if you are able. Talk with your manager about how to best perform tasks in a way that is safe. ? If your tennis elbow is caused by computer use, talk with your manager  about any changes that can be made to your work environment. General instructions  If directed, apply ice to the painful area: ? Put ice in a plastic bag. ? Place a towel between your skin and the bag. ? Leave the ice on for 20 minutes, 2-3 times per day.  Take medicines only as directed by your health care provider.  If you were given a brace, wear it as directed by your health care provider.  Keep all follow-up visits as directed by your health care provider. This is important. Contact a health  care provider if:  Your pain does not get better with treatment.  Your pain gets worse.  You have numbness or weakness in your forearm, hand, or fingers. This information is not intended to replace advice given to you by your health care provider. Make sure you discuss any questions you have with your health care provider. Document Released: 06/28/2005 Document Revised: 02/26/2016 Document Reviewed: 06/24/2014 Elsevier Interactive Patient Education  [employer]2018 Elsevier Inc.

## 2018-04-14 ENCOUNTER — Ambulatory Visit: Payer: Managed Care, Other (non HMO)

## 2018-05-30 ENCOUNTER — Telehealth: Payer: Self-pay | Admitting: Sports Medicine

## 2018-05-30 MED ORDER — NONFORMULARY OR COMPOUNDED ITEM: 11 refills | Status: DC

## 2018-05-30 NOTE — Telephone Encounter (Signed)
We can refill. She does not need an appointment. Thanks Dr. SKathie Rhodes

## 2018-05-30 NOTE — Telephone Encounter (Signed)
Pharmacy called on behalf of patient. Pt called requesting refill on Nail Lacor prescription. Last seen in office June 2018. Please give pharmacy a call.  [phone]

## 2018-05-30 NOTE — Addendum Note (Signed)
Addended by: Alphia Kava'CONNELL, Mckaylee Dimalanta D on: 05/30/2018 02:55 PM   Modules accepted: Orders

## 2018-05-30 NOTE — Telephone Encounter (Signed)
Dr. Marylene LandStover had prescribed [facility] 08/24/2016 Nail lacquer, and ordered refill +11refills. I left message informing pt of Dr. Wynema BirchStover's orders and [facility] [phone] would call with coverage and delivery information. [facility] to [facility]

## 2018-06-05 ENCOUNTER — Ambulatory Visit
Admission: RE | Admit: 2018-06-05 | Discharge: 2018-06-05 | Disposition: A | Discharge status: Home / Self Care | Payer: BLUE CROSS/BLUE SHIELD | Source: Ambulatory Visit | Attending: Family Medicine | Admitting: Family Medicine

## 2018-06-05 DIAGNOSIS — Z1239 Encounter for other screening for malignant neoplasm of breast: Secondary | ICD-10-CM

## 2018-06-05 IMAGING — MG DIGITAL SCREENING BILATERAL MAMMOGRAM WITH TOMO AND CAD
5 series · 6 of 13 positions shown · non-contrast
Comparison: Previous exam(s).

CLINICAL DATA: Screening.

EXAM:
DIGITAL SCREENING BILATERAL MAMMOGRAM WITH TOMO AND CAD

[L MLO synth-2D]
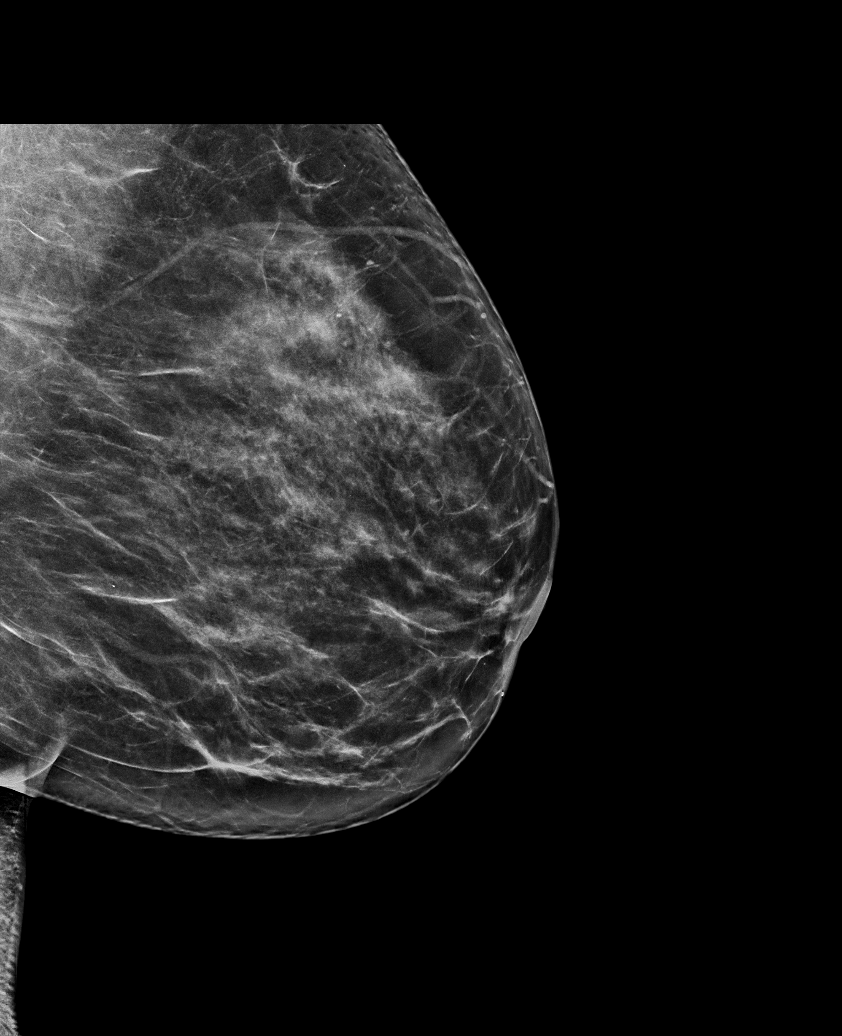

[L CC synth-2D (1 of 2)]
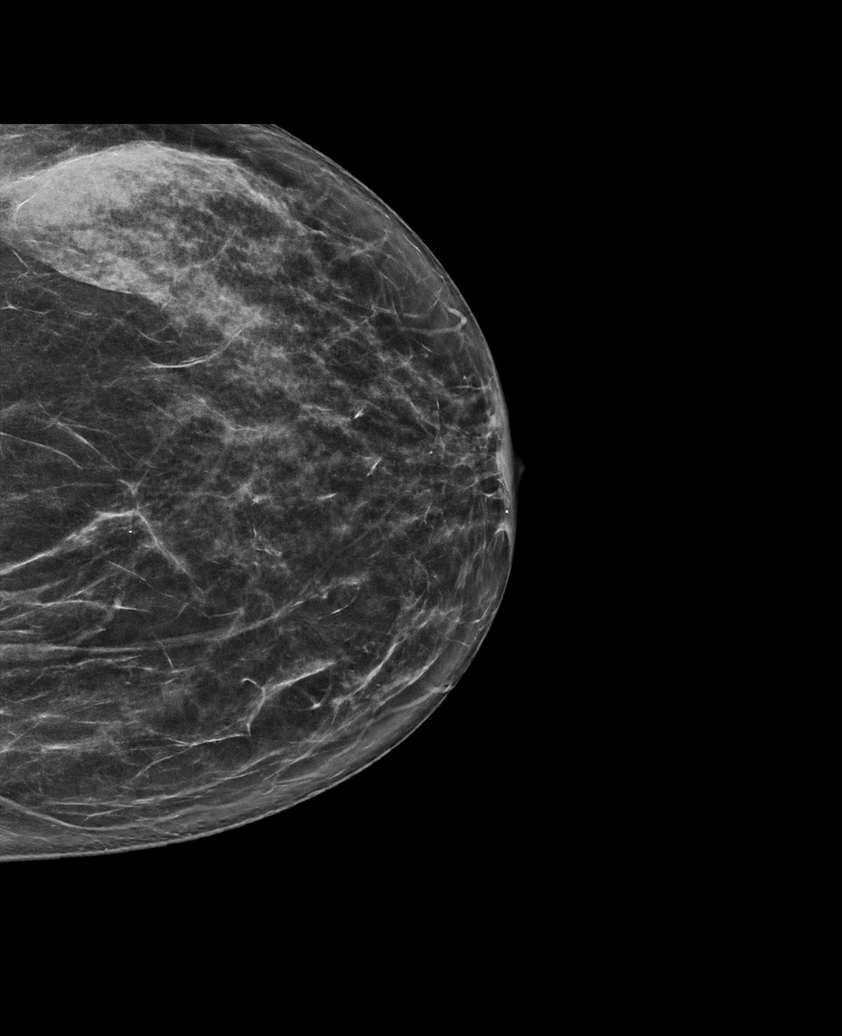

[L CC synth-2D (2 of 2)]
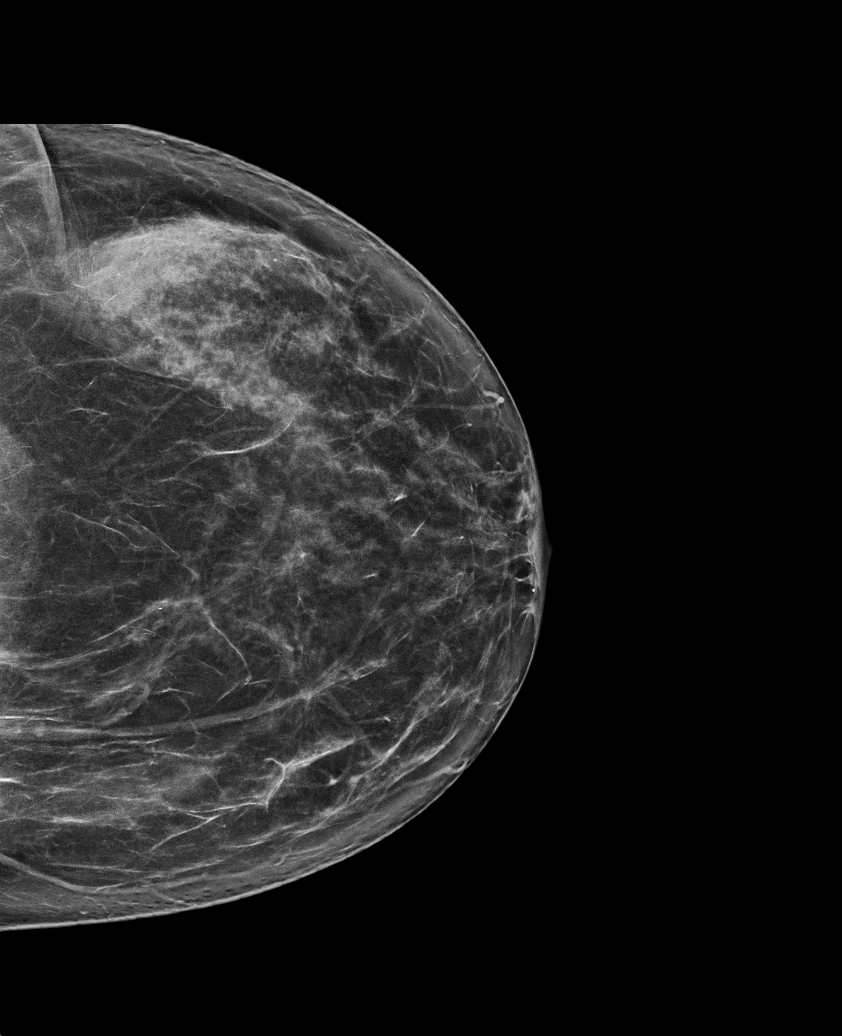

[L CC tomo · 2 of 78 frames shown]
[frame 26/78]
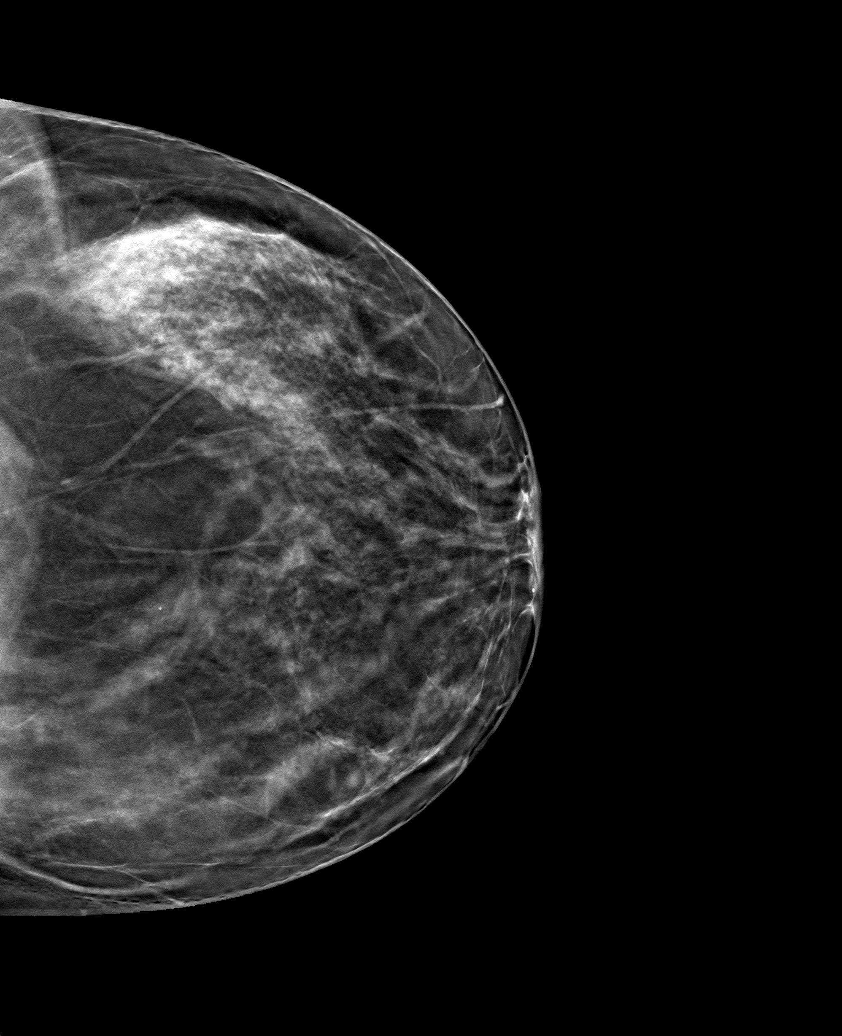
[frame 39/78]
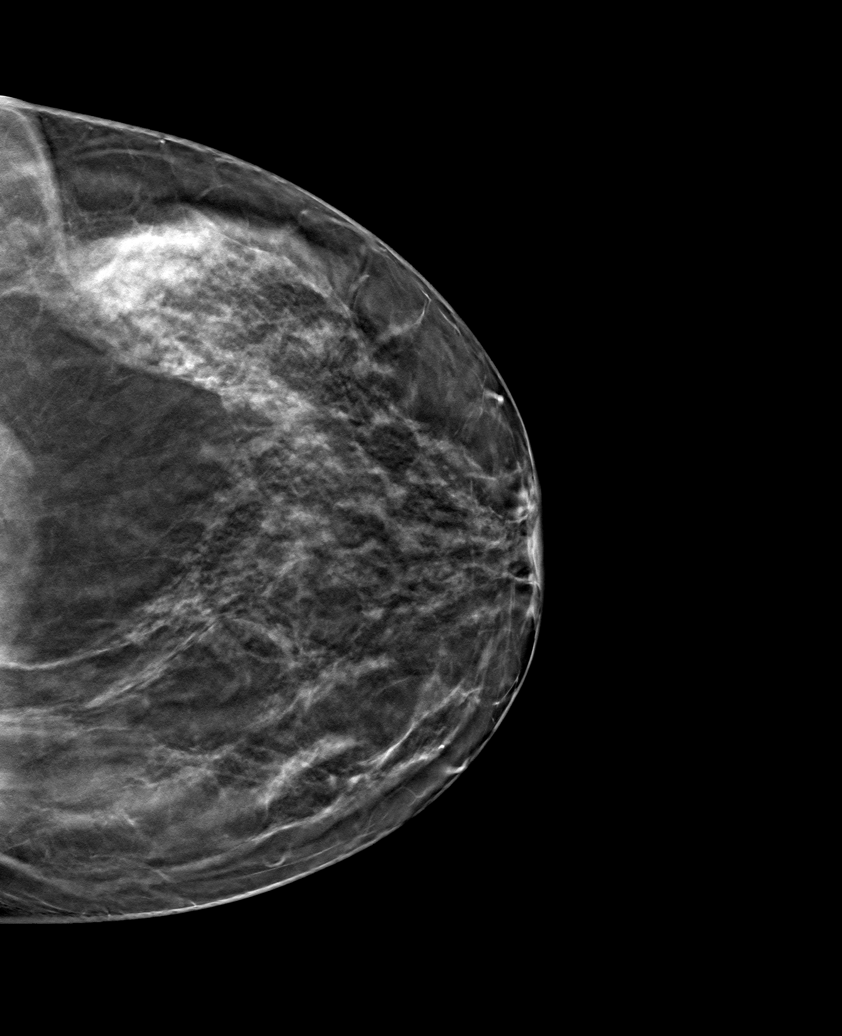

[L MLO tomo · tomo slice 41/81.0]
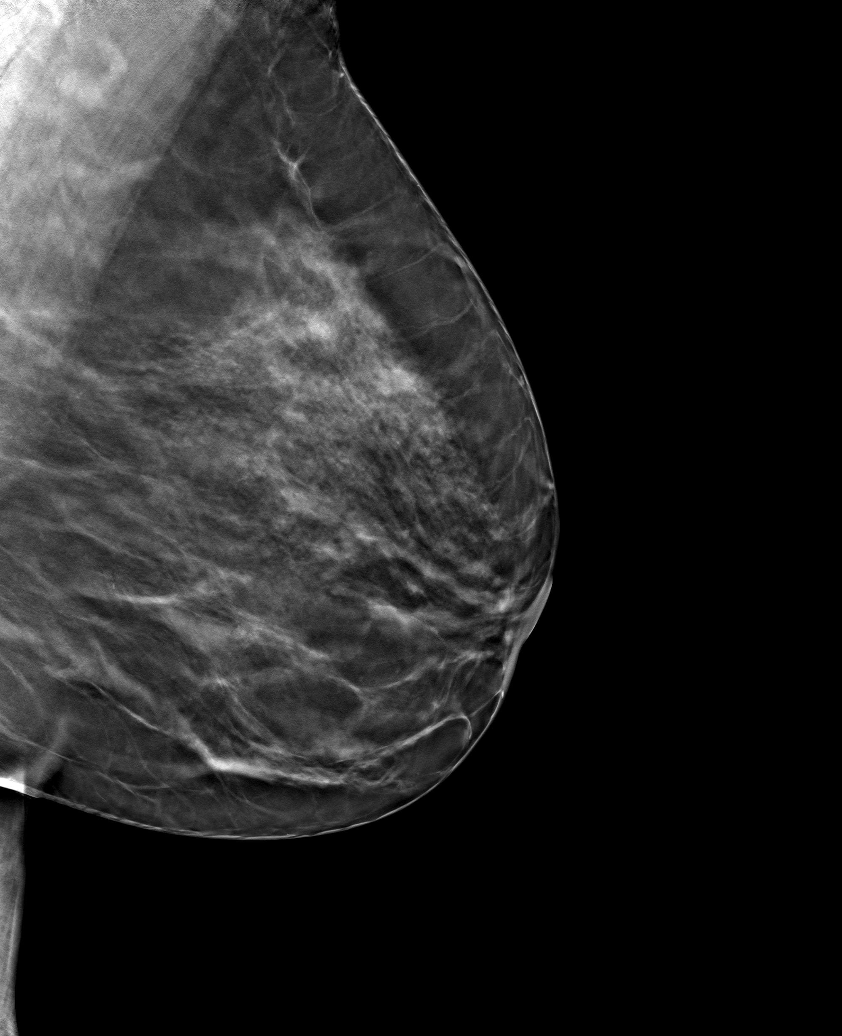

[6 of 13 positions shown; findings below may reference images not displayed]

ACR Breast Density Category c: The breast tissue is heterogeneously
dense, which may obscure small masses.
FINDINGS: There are no findings suspicious for malignancy. Images were
processed with CAD.
IMPRESSION: No mammographic evidence of malignancy. A result letter of this
screening mammogram will be mailed directly to the patient.

RECOMMENDATION:
Screening mammogram in one year. (Code:[5V])

BI-RADS CATEGORY  1: Negative.

## 2018-09-21 ENCOUNTER — Encounter: Payer: Self-pay | Admitting: Family Medicine

## 2018-09-21 ENCOUNTER — Ambulatory Visit (INDEPENDENT_AMBULATORY_CARE_PROVIDER_SITE_OTHER): Payer: PRIVATE HEALTH INSURANCE | Admitting: Family Medicine

## 2018-09-21 ENCOUNTER — Encounter: Payer: BLUE CROSS/BLUE SHIELD | Admitting: Family Medicine

## 2018-09-21 ENCOUNTER — Other Ambulatory Visit: Payer: Self-pay

## 2018-09-21 VITALS — BP 122/81 | HR 70 | Temp 98.1°F | Resp 16 | Ht 66.0 in | Wt 181.1 lb

## 2018-09-21 DIAGNOSIS — R0683 Snoring: Secondary | ICD-10-CM

## 2018-09-21 DIAGNOSIS — Z0000 Encounter for general adult medical examination without abnormal findings: Secondary | ICD-10-CM

## 2018-09-21 DIAGNOSIS — J45990 Exercise induced bronchospasm: Secondary | ICD-10-CM

## 2018-09-21 HISTORY — DX: Exercise induced bronchospasm: J45.990

## 2018-09-21 LAB — COMPREHENSIVE METABOLIC PANEL
ALBUMIN: 4.7 g/dL (ref 3.5–5.2)
ALT: 29 U/L (ref 0–35)
AST: 21 U/L (ref 0–37)
Alkaline Phosphatase: 45 U/L (ref 39–117)
BUN: 12 mg/dL (ref 6–23)
CALCIUM: 9.2 mg/dL (ref 8.4–10.5)
CHLORIDE: 101 meq/L (ref 96–112)
CO2: 27 meq/L (ref 19–32)
CREATININE: 0.75 mg/dL (ref 0.40–1.20)
GFR: 82.17 mL/min (ref >60.00)
Glucose, Bld: 82 mg/dL (ref 70–99)
Potassium: 3.7 mEq/L (ref 3.5–5.1)
SODIUM: 136 meq/L (ref 135–145)
TOTAL PROTEIN: 6.9 g/dL (ref 6.0–8.3)
Total Bilirubin: 0.6 mg/dL (ref 0.2–1.2)

## 2018-09-21 LAB — CBC WITH DIFFERENTIAL/PLATELET
BASOS ABS: 0 10*3/uL (ref 0.0–0.1)
Basophils Relative: 0.9 % (ref 0.0–3.0)
Eosinophils Absolute: 0 10*3/uL (ref 0.0–0.7)
Eosinophils Relative: 0.9 % (ref 0.0–5.0)
HCT: 38.5 % (ref 36.0–46.0)
HEMOGLOBIN: 13.6 g/dL (ref 12.0–15.0)
Lymphocytes Relative: 31.2 % (ref 12.0–46.0)
Lymphs Abs: 1.6 10*3/uL (ref 0.7–4.0)
MCHC: 35.3 g/dL (ref 30.0–36.0)
MCV: 93.7 fl (ref 78.0–100.0)
MONO ABS: 0.4 10*3/uL (ref 0.1–1.0)
MONOS PCT: 7.3 % (ref 3.0–12.0)
Neutro Abs: 3 10*3/uL (ref 1.4–7.7)
Neutrophils Relative %: 59.7 % (ref 43.0–77.0)
Platelets: 284 10*3/uL (ref 150.0–400.0)
RBC: 4.11 Mil/uL (ref 3.87–5.11)
RDW: 12.1 % (ref 11.5–15.5)
WBC: 5 10*3/uL (ref 4.0–10.5)

## 2018-09-21 LAB — LIPID PANEL
CHOLESTEROL: 139 mg/dL (ref 0–200)
HDL: 43.7 mg/dL (ref >39.00)
LDL CALC: 80 mg/dL (ref 0–99)
NonHDL: 94.83
TRIGLYCERIDES: 72 mg/dL (ref 0.0–149.0)
Total CHOL/HDL Ratio: 3
VLDL: 14.4 mg/dL (ref 0.0–40.0)

## 2018-09-21 LAB — TSH: TSH: 1.39 u[IU]/mL (ref 0.35–4.50)

## 2018-09-21 MED ORDER — ALBUTEROL SULFATE HFA 108 (90 BASE) MCG/ACT IN AERS: 2.0000 | INHALATION_SPRAY | RESPIRATORY_TRACT | 2 refills | Status: DC | PRN

## 2018-09-21 NOTE — Patient Instructions (Addendum)
Please return in 12 months for your annual complete physical; please come fasting.  I will release your lab results to you on your MyChart account with further instructions. Please reply with any questions.   We will call you with information regarding your referral appointment. [facility] for sleep study screening.  If you do not hear from [place] within the next 2 weeks, please let me know. It can take 1-2 weeks to get appointments set up with the specialists.    If you have any questions or concerns, please don't hesitate to send me a message via MyChart or call the office at [phone]. Thank you for visiting with [place] today! It's our pleasure caring for you.  Please do these things to maintain good health!   Exercise at least 30-45 minutes a day,  4-5 days a week.   Eat a low-fat diet with lots of fruits and vegetables, up to 7-9 servings per day.  Drink plenty of water daily. Try to drink 8 8oz glasses per day.  Seatbelts can save your life. Always wear your seatbelt.  Place Smoke Detectors on every level of your home and check batteries every year.  Schedule an appointment with an eye doctor for an eye exam every 1-2 years  Safe sex - use condoms to protect yourself from STDs if you could be exposed to these types of infections. Use birth control if you do not want to become pregnant and are sexually active.  Avoid heavy alcohol use. If you drink, keep it to less than 2 drinks/day and not every day.  Health Care Power of Attorney.  Choose someone you trust that could speak for you if you became unable to speak for yourself.  Depression is common in our stressful world.If you're feeling down or losing interest in things you normally enjoy, please come in for a visit.  If anyone is threatening or hurting you, please get help. Physical or Emotional Violence is never OK.   Asthma, Adult  Asthma is a long-term (chronic) condition that causes recurrent episodes in which the  airways become tight and narrow. The airways are the passages that lead from the nose and mouth down into the lungs. Asthma episodes, also called asthma attacks, can cause coughing, wheezing, shortness of breath, and chest pain. The airways can also fill with mucus. During an attack, it can be difficult to breathe. Asthma attacks can range from minor to life threatening. Asthma cannot be cured, but medicines and lifestyle changes can help control it and treat acute attacks. What are the causes? This condition is believed to be caused by inherited (genetic) and environmental factors, but its exact cause is not known. There are many things that can bring on an asthma attack or make asthma symptoms worse (triggers). Asthma triggers are different for each person. Common triggers include:  Mold.  Dust.  Cigarette smoke.  Cockroaches.  Things that can cause allergy symptoms (allergens), such as animal dander or pollen from trees or grass.  Air pollutants such as household cleaners, wood smoke, smog, or Therapist, occupational.  Cold air, weather changes, and winds (which increase molds and pollen in the air).  Strong emotional expressions such as crying or laughing hard.  Stress.  Certain medicines (such as aspirin) or types of medicines (such as beta-blockers).  Sulfites in foods and drinks. Foods and drinks that may contain sulfites include dried fruit, potato chips, and sparkling grape juice.  Infections or inflammatory conditions such as the flu, a cold,  or inflammation of the nasal membranes (rhinitis).  Gastroesophageal reflux disease (GERD).  Exercise or strenuous activity. What are the signs or symptoms? Symptoms of this condition may occur right after asthma is triggered or many hours later. Symptoms include:  Wheezing. This can sound like whistling when you breathe.  Excessive nighttime or early morning coughing.  Frequent or severe coughing with a common cold.  Chest tightness.   Shortness of breath.  Tiredness (fatigue) with minimal activity. How is this diagnosed? This condition is diagnosed based on:  Your medical history.  A physical exam.  Tests, which may include: ? Lung function studies and pulmonary studies (spirometry). These tests can evaluate the flow of air in your lungs. ? Allergy tests. ? Imaging tests, such as X-rays. How is this treated? There is no cure for this condition, but treatment can help control your symptoms. Treatment for asthma usually involves:  Identifying and avoiding your asthma triggers.  Using medicines to control your symptoms. Generally, two types of medicines are used to treat asthma: ? Controller medicines. These help prevent asthma symptoms from occurring. They are usually taken every day. ? Fast-acting reliever or rescue medicines. These quickly relieve asthma symptoms by widening the narrow and tight airways. They are used as needed and provide short-term relief.  Using supplemental oxygen. This may be needed during a severe episode.  Using other medicines, such as: ? Allergy medicines, such as antihistamines, if your asthma attacks are triggered by allergens. ? Immune medicines (immunomodulators). These are medicines that help control the immune system.  Creating an asthma action plan. An asthma action plan is a written plan for managing and treating your asthma attacks. This plan includes: ? A list of your asthma triggers and how to avoid them. ? Information about when medicines should be taken and when their dosage should be changed. ? Instructions about using a device called a peak flow meter. A peak flow meter measures how well the lungs are working and the severity of your asthma. It helps you monitor your condition. Follow these instructions at home: Controlling your home environment Control your home environment in the following ways to help avoid triggers and prevent asthma attacks:  Change your heating  and air conditioning filter regularly.  Limit your use of fireplaces and wood stoves.  Get rid of pests (such as roaches and mice) and their droppings.  Throw away plants if you see mold on them.  Clean floors and dust surfaces regularly. Use unscented cleaning products.  Try to have someone else vacuum for you regularly. Stay out of rooms while they are being vacuumed and for a short while afterward. If you vacuum, use a dust mask from a hardware store, a double-layered or microfilter vacuum cleaner bag, or a vacuum cleaner with a HEPA filter.  Replace carpet with wood, tile, or vinyl flooring. Carpet can trap dander and dust.  Use allergy-proof pillows, mattress covers, and box spring covers.  Keep your bedroom a trigger-free room.  Avoid pets and keep windows closed when allergens are in the air.  Wash beddings every week in hot water and dry them in a dryer.  Use blankets that are made of polyester or cotton.  Clean bathrooms and kitchens with bleach. If possible, have someone repaint the walls in these rooms with mold-resistant paint. Stay out of the rooms that are being cleaned and painted.  Wash your hands often with soap and water. If soap and water are not available, use hand sanitizer.  Do not allow anyone to smoke in your home. General instructions  Take over-the-counter and prescription medicines only as told by your health care provider. ? Speak with your health care provider if you have questions about how or when to take the medicines. ? Make note if you are requiring more frequent dosages.  Do not use any products that contain nicotine or tobacco, such as cigarettes and e-cigarettes. If you need help quitting, ask your health care provider. Also, avoid being exposed to secondhand smoke.  Use a peak flow meter as told by your health care provider. Record and keep track of the readings.  Understand and use the asthma action plan to help minimize, or stop an asthma  attack, without needing to seek medical care.  Make sure you stay up to date on your yearly vaccinations as told by your health care provider. This may include vaccines for the flu and pneumonia.  Avoid outdoor activities when allergen counts are high and when air quality is low.  Wear a ski mask that covers your nose and mouth during outdoor winter activities. Exercise indoors on cold days if you can.  Warm up before exercising, and take time for a cool-down period after exercise.  Keep all follow-up visits as told by your health care provider. This is important. Where to find more information  For information about asthma, turn to the Centers for Disease Control and Prevention at http://www.mills-berg.com/.htm  For air quality information, turn to [employer] at [employer] Contact a health care provider if:  You have wheezing, shortness of breath, or a cough even while you are taking medicine to prevent attacks.  The mucus you cough up (sputum) is thicker than usual.  Your sputum changes from clear or white to yellow, green, gray, or bloody.  Your medicines are causing side effects, such as a rash, itching, swelling, or trouble breathing.  You need to use a reliever medicine more than 2-3 times a week.  Your peak flow reading is still at 50-79% of your personal best after following your action plan for 1 hour.  You have a fever. Get help right away if:  You are getting worse and do not respond to treatment during an asthma attack.  You are short of breath when at rest or when doing very little physical activity.  You have difficulty eating, drinking, or talking.  You have chest pain or tightness.  You develop a fast heartbeat or palpitations.  You have a bluish color to your lips or fingernails.  You are light-headed or dizzy, or you faint.  Your peak flow reading is less than 50% of your personal best.  You feel too tired to breathe normally. Summary  Asthma is  a long-term (chronic) condition that causes recurrent episodes in which the airways become tight and narrow. These episodes can cause coughing, wheezing, shortness of breath, and chest pain.  Asthma cannot be cured, but medicines and lifestyle changes can help control it and treat acute attacks.  Make sure you understand how to avoid triggers and how and when to use your medicines.  Asthma attacks can range from minor to life threatening. Get help right away if you have an asthma attack and do not respond to treatment with your usual rescue medicines. This information is not intended to replace advice given to you by your health care provider. Make sure you discuss any questions you have with your health care provider. Document Released: 06/28/2005 Document Revised: 08/02/2016 Document Reviewed: 08/02/2016 Elsevier  Interactive Patient Education  [employer].

## 2018-09-21 NOTE — Progress Notes (Signed)
Subjective  Chief Complaint  Patient presents with  . Annual Exam    HPI: Ariana Walsh is a 48 y.o. female who presents to [facility] today for a Female Wellness Visit.   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  She has noticed some wheezing.  She reports history of exercise and allergy induced asthma.  She is requesting inhaler.  She denies daily wheezing symptoms.  She is not currently treating her allergies.  Review of systems is positive for snoring.  She denies witnessed apnea.  She denies daytime fatigue or somnolence.  Health maintenance is up-to-date.  She declines flu shot  Assessment  1. Annual physical exam   2. Exercise-induced asthma   3. Snoring      Plan  Female Wellness Visit:  Age appropriate Health Maintenance and Prevention measures were discussed with patient. Included topics are cancer screening recommendations, ways to keep healthy (see AVS) including dietary and exercise recommendations, regular eye and dental care, use of seat belts, and avoidance of moderate alcohol use and tobacco use.   BMI: discussed patient's BMI and encouraged positive lifestyle modifications to help get to or maintain a target BMI.  HM needs and immunizations were addressed and ordered. See below for orders. See HM and immunization section for updates.  Routine labs and screening tests ordered including cmp, cbc and lipids where appropriate.  Discussed recommendations regarding Vit D and calcium supplementation (see AVS)  Refilled albuterol and educated regarding asthma care and follow-up.  If albuterol use is frequent, she will return for further evaluation  Snoring: Refer for evaluation regarding need for sleep apnea eval   Follow up: Return in about 1 year (around 09/21/2019)  for complete physical.   Orders Placed This Encounter  Procedures  . CBC with Differential/Platelet  . Comprehensive metabolic panel  . Lipid panel  . HIV Antibody (routine testing w rflx)  . TSH  . Ambulatory referral to Pulmonology   Meds ordered this encounter  Medications  . albuterol (PROVENTIL HFA;VENTOLIN HFA) 108 (90 Base) MCG/ACT inhaler    Sig: Inhale 2 puffs into the lungs every 4 (four) hours as needed for wheezing or shortness of breath.    Dispense:  1 Inhaler    Refill:  2     Lifestyle: Body mass index is 29.23 kg/m. Wt Readings from Last 3 Encounters:  09/21/18 181 lb 2 oz (82.2 kg)  02/17/18 173 lb (78.5 kg)  09/13/17 177 lb 3.2 oz (80.4 kg)   Diet: general Exercise: frequently,   Patient Active Problem List   Diagnosis Date Noted  . Exercise-induced asthma 09/21/2018    Allergy induced also   . Perimenopausal symptoms 02/17/2018  . Umbilical hernia 12/30/2016    Dr. Rayburn Walsh. Patient is waiting for a better time to have the surgery.   Marland Kitchen BMI 27.0-27.9,adult 12/30/2016  . 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    Health Maintenance  Topic Date Due  . INFLUENZA VACCINE  10/10/2018 (Originally 02/09/2018)  . MAMMOGRAM  06/06/2019  . PAP SMEAR-Modifier  09/10/2019  . TETANUS/TDAP  12/10/2021  . HIV Screening  Completed    There is no immunization history on file for this patient. We updated and reviewed the patient's past history in detail and it is documented below. Allergies: Patient is allergic to  latex. Past Medical History Patient  has a past medical history of Exercise-induced asthma (09/21/2018) and Hernia, umbilical. Past Surgical History Patient  has a past surgical history that includes Dental surgery and IVF. Family History: Patient family history includes Arthritis in her father; Breast cancer in her mother; Diabetes in her father; Endometrial cancer in her mother; Heart disease in her father; Hypertension in her father and  mother; Mental illness in her brother and father. Social History:  Patient  reports that she has never smoked. She has never used smokeless tobacco. She reports current alcohol use. She reports that she does not use drugs.  Review of Systems: Constitutional: negative for fever or malaise Ophthalmic: negative for photophobia, double vision or loss of vision Cardiovascular: negative for chest pain, dyspnea on exertion, or new LE swelling Respiratory: negative for SOB or persistent cough Gastrointestinal: negative for abdominal pain, change in bowel habits or melena Genitourinary: negative for dysuria or gross hematuria, no abnormal uterine bleeding or disharge Musculoskeletal: negative for new gait disturbance or muscular weakness Integumentary: negative for new or persistent rashes, no breast lumps Neurological: negative for TIA or stroke symptoms Psychiatric: negative for SI or delusions Allergic/Immunologic: negative for hives  Patient Care Team    Relationship Specialty Notifications Start End  [name], MD PCP - General Family Medicine  02/17/18   [name], MD Consulting Physician General Surgery  12/30/16     Objective  Vitals: BP 122/81   Pulse 70   Temp 98.1 F (36.7 C) (Oral)   Resp 16   Ht 5\' 6"  (1.676 m)   Wt 181 lb 2 oz (82.2 kg)   SpO2 97%   BMI 29.23 kg/m  General:  Well developed, well nourished, no acute distress  Psych:  Alert and orientedx3,normal mood and affect HEENT:  Normocephalic, atraumatic, non-icteric sclera, PERRL, oropharynx is clear without mass or exudate, supple neck without adenopathy, mass or thyromegaly Cardiovascular:  Normal S1, S2, RRR without gallop, rub or murmur, nondisplaced PMI Respiratory:  Good breath sounds bilaterally, CTAB with normal respiratory effort Gastrointestinal: normal bowel sounds, soft, non-tender, no noted masses. No HSM MSK: no deformities, contusions. Joints are without erythema or swelling. Spine and CVA  region are nontender Skin:  Warm, no rashes or suspicious lesions noted Neurologic:    Mental status is normal. CN 2-11 are normal. Gross motor and sensory exams are normal. Normal gait. No tremor Breast Exam: No mass, skin retraction or nipple discharge is appreciated in either breast. No axillary adenopathy. Fibrocystic changes are not noted   Commons side effects, risks, benefits, and alternatives for medications and treatment plan prescribed today were discussed, and the patient expressed understanding of the given instructions. Patient is instructed to call or message via MyChart if he/she has any questions or concerns regarding our treatment plan. No barriers to understanding were identified. We discussed Red Flag symptoms and signs in detail. Patient expressed understanding regarding what to do in case of urgent or emergency type symptoms.   Medication list was reconciled, printed and provided to the patient in AVS. Patient instructions and summary information was reviewed with the patient as documented in the AVS. This note was prepared with assistance of Dragon voice recognition software. Occasional wrong-word or sound-a-like substitutions may have occurred due to the inherent limitations of voice recognition software

## 2018-09-22 LAB — HIV ANTIBODY (ROUTINE TESTING W REFLEX): HIV: NONREACTIVE

## 2018-10-05 ENCOUNTER — Encounter: Payer: Self-pay | Admitting: Family Medicine

## 2019-01-02 ENCOUNTER — Telehealth: Payer: Self-pay | Admitting: *Deleted

## 2019-01-02 NOTE — Telephone Encounter (Signed)
Pt states she would like a refill of the nail lacquer.

## 2019-01-02 NOTE — Telephone Encounter (Signed)
Left message for pt to call to discuss new pharmacy and nail lacquer.

## 2019-01-03 ENCOUNTER — Other Ambulatory Visit: Payer: Self-pay

## 2019-01-03 NOTE — Telephone Encounter (Signed)
Ariana Walsh, please refill through [facility]

## 2019-01-04 ENCOUNTER — Telehealth: Payer: Self-pay

## 2019-01-04 NOTE — Telephone Encounter (Signed)
LVM to Pt. Stating that this is a new Rx, not the same as she used in the past

## 2019-01-04 NOTE — Telephone Encounter (Signed)
Rx faxed to [facility]. Called and notified Pt. Of her Rx

## 2019-01-04 NOTE — Telephone Encounter (Signed)
Patient called wanting to know if the prescription that was just sent in was the new prescription for a different nail laquer

## 2019-01-08 ENCOUNTER — Telehealth: Payer: Self-pay

## 2019-01-08 NOTE — Telephone Encounter (Signed)
Called patient back and she did not answered. I left a VM to Pt. [facility]'s number and if she had any other issues to contact our office as needed.

## 2019-01-08 NOTE — Telephone Encounter (Signed)
Patient called stating she has not received a phone call or anything from the pharmacy in regards to the new nail lacquer that was prescribed to her.

## 2019-02-05 ENCOUNTER — Telehealth: Payer: Self-pay

## 2019-02-05 NOTE — Telephone Encounter (Signed)
Copied from [place] [id number]. Topic: General - Inquiry >> Feb 05, 2019 10:24 AM Lionel December wrote: Reason for CRM: Patient was told to call the office before she faxed over some forms. She is faxing them now at 10:25 on 02/05/19

## 2019-02-08 ENCOUNTER — Encounter: Payer: Self-pay | Admitting: Family Medicine

## 2019-02-09 ENCOUNTER — Telehealth: Payer: Self-pay | Admitting: Physical Therapy

## 2019-02-09 NOTE — Telephone Encounter (Signed)
Copied from [place] [id number]. Topic: General - Call Back - No Documentation >> Feb 08, 2019  4:56 PM Erick Blinks wrote: Reason for CRM: Pt called requesting urgent call back regarding documentation has important questions. Time sensitive. FU if appt is necessary Best contact: [phone]

## 2019-02-09 NOTE — Telephone Encounter (Signed)
Spoke with pt via Mychart message she sent and she also has scheduled a virtual appointment

## 2019-02-10 ENCOUNTER — Encounter: Payer: Self-pay | Admitting: Family Medicine

## 2019-02-12 ENCOUNTER — Encounter: Payer: Self-pay | Admitting: Family Medicine

## 2019-02-12 ENCOUNTER — Ambulatory Visit (INDEPENDENT_AMBULATORY_CARE_PROVIDER_SITE_OTHER): Payer: PRIVATE HEALTH INSURANCE | Admitting: Family Medicine

## 2019-02-12 VITALS — Wt 183.0 lb

## 2019-02-12 DIAGNOSIS — J45990 Exercise induced bronchospasm: Secondary | ICD-10-CM | POA: Diagnosis not present

## 2019-02-12 DIAGNOSIS — Z7189 Other specified counseling: Secondary | ICD-10-CM | POA: Diagnosis not present

## 2019-02-12 NOTE — Progress Notes (Signed)
     Virtual Visit via Video Note  Subjective  CC:  Chief Complaint  Patient presents with  . 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 Location patient: Home Location provider: [facility] [place], Office Persons participating in the virtual visit: Annamarie Yamaguchi Kreisler, [name], MD Lilli Light, [place] discussed the limitations of evaluation and management by telemedicine and the availability of in person appointments. The patient expressed understanding and agreed to proceed. HPI: Ariana Walsh is a 48 y.o. female who was contacted today to address the problems listed above in the chief complaint. [age] yo with EIA presesnts to request medical accomodations to avoid covid-19; she is a Education officer, museum at [employer] and is a single parent with an elderly mother. She feels she is high risk due to her asthma ( I agree) and would be difficult to care for child due to little support in the area. 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 See paperwork.   Assessment  1. Exercise-induced asthma   2. Educated About Covid-19 Virus Infection      Plan     Education given. Agree with request for accomodations and form completed.  I discussed the assessment and treatment plan with the patient. The patient was provided an opportunity to ask questions and all were answered. The patient agreed with the plan and demonstrated an understanding of the instructions.   The patient was advised to call back or seek an in-person evaluation if the symptoms worsen or if the condition fails to improve as anticipated. Follow up: No follow-ups on file.  Visit date not found  No orders of the defined types were placed in this encounter.      I reviewed the patients updated PMH, FH, and SocHx.    Patient Active Problem List   Diagnosis Date Noted  . Exercise-induced asthma 09/21/2018  . Perimenopausal symptoms 02/17/2018  . Umbilical hernia 61/60/7371  . BMI 27.0-27.9,adult 12/30/2016  . Dystrophic nail 12/30/2016   Current Meds  Medication Sig  . albuterol (PROVENTIL HFA;VENTOLIN HFA) 108 (90 Base) MCG/ACT inhaler Inhale 2 puffs into the lungs every 4 (four) hours as needed for wheezing or shortness of breath.  . TERBINAFINE EX Apply topically.    Allergies: Patient is allergic to latex. Family History: Patient family history includes Arthritis in her father; Breast cancer in her mother; Diabetes in her father; Endometrial cancer in her mother; Heart disease in her father; Hypertension in her father and mother; Mental illness in her brother and father. Social History:  Patient  reports that she has never smoked. She has never used smokeless tobacco. She reports current alcohol use. She reports that she does not use drugs.  Review of Systems: Constitutional: Negative for fever malaise or anorexia Cardiovascular: negative for chest pain Respiratory: negative for SOB or persistent cough Gastrointestinal: negative for abdominal pain  OBJECTIVE Vitals: Wt 183 lb (83 kg)   LMP 01/25/2019   BMI 29.54 kg/m  General: no acute distress , A&Ox3, appears worried  [name], MD

## 2019-04-09 ENCOUNTER — Institutional Professional Consult (permissible substitution): Payer: PRIVATE HEALTH INSURANCE | Admitting: Internal Medicine

## 2019-06-14 ENCOUNTER — Institutional Professional Consult (permissible substitution): Payer: PRIVATE HEALTH INSURANCE | Admitting: Internal Medicine

## 2019-07-30 ENCOUNTER — Institutional Professional Consult (permissible substitution): Payer: PRIVATE HEALTH INSURANCE | Admitting: Internal Medicine

## 2019-09-08 ENCOUNTER — Ambulatory Visit: Payer: Managed Care, Other (non HMO) | Attending: Internal Medicine

## 2019-09-08 DIAGNOSIS — Z23 Encounter for immunization: Secondary | ICD-10-CM | POA: Insufficient documentation

## 2019-09-08 NOTE — Progress Notes (Signed)
   Covid-19 Vaccination Clinic  Name:  Ariana Walsh    MRN: [id number] DOB: 03/13/1971  09/08/2019  [name] was observed post Covid-19 immunization for 15 minutes without incidence. She was provided with Vaccine Information Sheet and instruction to access the V-Safe system.   [name] was instructed to call 911 with any severe reactions post vaccine: Marland Kitchen Difficulty breathing  . Swelling of your face and throat  . A fast heartbeat  . A bad rash all over your body  . Dizziness and weakness    Immunizations Administered    Name Date Dose VIS Date Route   Pfizer COVID-19 Vaccine 09/08/2019  5:11 PM 0.3 mL 06/22/2019 Intramuscular   Manufacturer: [employer], [employer]   Lot: [id number]   NDC: [address]-0

## 2019-09-29 ENCOUNTER — Ambulatory Visit: Payer: Managed Care, Other (non HMO) | Attending: Internal Medicine

## 2019-09-29 DIAGNOSIS — Z23 Encounter for immunization: Secondary | ICD-10-CM

## 2019-09-29 NOTE — Progress Notes (Signed)
   Covid-19 Vaccination Clinic  Name:  Ariana Walsh    MRN: [id number] DOB: 1971-07-08  09/29/2019  Ariana Walsh was observed post Covid-19 immunization for 15 minutes without incident. She was provided with Vaccine Information Sheet and instruction to access the V-Safe system.   Ariana Walsh was instructed to call 911 with any severe reactions post vaccine: Marland Kitchen Difficulty breathing  . Swelling of face and throat  . A fast heartbeat  . A bad rash all over body  . Dizziness and weakness   Immunizations Administered    Name Date Dose VIS Date Route   Pfizer COVID-19 Vaccine 09/29/2019  2:59 PM 0.3 mL 06/22/2019 Intramuscular   Manufacturer: [employer], [employer]   Lot: [id number]   NDC: [address]-1

## 2019-10-04 ENCOUNTER — Other Ambulatory Visit: Payer: Self-pay

## 2019-10-04 ENCOUNTER — Ambulatory Visit: Payer: PRIVATE HEALTH INSURANCE | Admitting: Internal Medicine

## 2019-10-04 ENCOUNTER — Encounter: Payer: Self-pay | Admitting: Internal Medicine

## 2019-10-04 VITALS — BP 130/78 | HR 61 | Temp 97.2°F | Ht 66.0 in | Wt 179.6 lb

## 2019-10-04 DIAGNOSIS — J45990 Exercise induced bronchospasm: Secondary | ICD-10-CM | POA: Diagnosis not present

## 2019-10-04 DIAGNOSIS — R0683 Snoring: Secondary | ICD-10-CM

## 2019-10-04 DIAGNOSIS — G4733 Obstructive sleep apnea (adult) (pediatric): Secondary | ICD-10-CM | POA: Insufficient documentation

## 2019-10-04 NOTE — Assessment & Plan Note (Signed)
Mild intermittent uncomplicated. Triggers have usually been exercise and viral syndrome respiratory infections. Currently clear.

## 2019-10-04 NOTE — Assessment & Plan Note (Signed)
Discussed snoring as a symptom, and question of OSA. Plan- HST. If she needs treatment, she might be candidate for oral appliance

## 2019-10-04 NOTE — Patient Instructions (Signed)
Order- schedule HST   Dx Snoring  Please call us about 2 weeks after your sleep test to see if results and recommendations are ready yet. If appropriate we may be able to start treatment before we see you next.

## 2019-10-04 NOTE — Progress Notes (Signed)
10/04/19- 49 yoF never smoker for sleep evaluation on kind referral by Dr Mardelle Matte because of snoring. 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 Mild Seasonal Rhinitis. Body weight today 179 lbs Epworth score 13 Partner c/o her snoring. He is not with her every night. She says sometimes he wakes her because of her snoring. She feels she sleeps well. Maybe some daytime tiredness occ.   No sleep meds. 2 cups coffee, some tea or soda. Denies hx ENT surgery and has been told in past she had big tonsils. Denies other cardiopulmonary probs, seizures, thyroid disease. Some weight gain during Covid restrictions.  Prior to Admission medications   Medication Sig Start Date End Date Taking? Authorizing Provider  albuterol (PROVENTIL HFA;VENTOLIN HFA) 108 (90 Base) MCG/ACT inhaler Inhale 2 puffs into the lungs every 4 (four) hours as needed for wheezing or shortness of breath. 09/21/18  Yes [name], MD  TERBINAFINE EX Apply topically.   Yes [provider]   Past Medical History:  Diagnosis Date  . Exercise-induced asthma 09/21/2018   Allergy induced also  . Hernia, umbilical    Past Surgical History:  Procedure Laterality Date  . DENTAL SURGERY    . IVF     egg collection   Family History  Problem Relation Age of Onset  . Breast cancer Mother        post-menopausal, DCIS  . Endometrial cancer Mother        mid-late 48's  . Hypertension Mother   . Hypertension Father   . Diabetes Father   . Heart disease Father        s/p 3V CABG age 36  . Mental illness Father        depression  . Arthritis Father        bilateral knee replacements  . Mental illness Brother        depression   Social History   Socioeconomic History  . Marital status: Single    Spouse name: Martina Sinner  . Number of children: 1  . Years of education: MFA in Directing  . Highest education level: Not on file  Occupational History   . Occupation: Adjunct professor     Comment: Radio broadcast assistant   Tobacco Use  . Smoking status: Never Smoker  . Smokeless tobacco: Never Used  Substance and Sexual Activity  . Alcohol use: Yes    Alcohol/week: 0.0 - 3.0 standard drinks  . Drug use: No  . Sexual activity: Yes    Partners: Male    Birth control/protection: Surgical    Comment: Partner has vasectomy   Other Topics Concern  . Not on file  Social History Narrative   Estranged from husband since 2011.   Lives with her son.   Her son was conceived by IVF after she and her husband split.   Breast fed x 4 years.   Social Determinants of Health   Financial Resource Strain:   . Difficulty of Paying Living Expenses:   Food Insecurity:   . Worried About Programme researcher, broadcasting/film/video in the Last Year:   . Barista in the Last Year:   Transportation Needs:   . Freight forwarder (Medical):   Marland Kitchen Lack of Transportation (Non-Medical):   Physical Activity:   . Days of Exercise per Week:   . Minutes of Exercise per Session:   Stress:   . Feeling of Stress :   Social  Connections:   . Frequency of Communication with Friends and Family:   . Frequency of Social Gatherings with Friends and Family:   . Attends Religious Services:   . Active Member of Clubs or Organizations:   . Attends Archivist Meetings:   Marland Kitchen Marital Status:   Intimate Partner Violence:   . Fear of Current or Ex-Partner:   . Emotionally Abused:   Marland Kitchen Physically Abused:   . Sexually Abused:    ROS-see HPI   + = positive Constitutional:    weight loss, night sweats, fevers, chills, fatigue, lassitude. HEENT:    headaches, difficulty swallowing, tooth/dental problems, sore throat,       sneezing, itching, ear ache, nasal congestion, post nasal drip, snoring CV:    chest pain, orthopnea, PND, swelling in lower extremities, anasarca,                                  dizziness, palpitations Resp:   shortness of breath with exertion or at rest.                 productive cough,   non-productive cough, coughing up of blood.              change in color of mucus.  wheezing.   Skin:    rash or lesions. GI:  No-   heartburn, indigestion, abdominal pain, nausea, vomiting, diarrhea,                 change in bowel habits, loss of appetite GU: dysuria, change in color of urine, no urgency or frequency.   flank pain. MS:   joint pain, stiffness, decreased range of motion, back pain. Neuro-     nothing unusual Psych:  change in mood or affect.  depression or anxiety.   memory loss.  OBJ- Physical Exam General- Alert, Oriented, Affect-appropriate, Distress- none acute, not obese Skin- rash-none, lesions- none, excoriation- none Lymphadenopathy- none Head- atraumatic            Eyes- Gross vision intact, PERRLA, conjunctivae and secretions clear            Ears- Hearing, canals-normal            Nose-  +Clear mucus, no-Septal dev, mucus, polyps, erosion, perforation             Throat- Mallampati II-III , mucosa clear , drainage- none, tonsils- atrophic Neck- flexible , trachea midline, no stridor , thyroid nl, carotid no bruit Chest - symmetrical excursion , unlabored           Heart/CV- RRR , no murmur , no gallop  , no rub, nl s1 s2                           - JVD- none , edema- none, stasis changes- none, varices- none           Lung- clear to P&A, wheeze- none, cough- none , dullness-none, rub- none           Chest wall-  Abd-  Br/ Gen/ Rectal- Not done, not indicated Extrem- cyanosis- none, clubbing, none, atrophy- none, strength- nl Neuro- grossly intact to observation

## 2019-10-23 ENCOUNTER — Encounter: Payer: PRIVATE HEALTH INSURANCE | Admitting: Family Medicine

## 2019-11-14 ENCOUNTER — Ambulatory Visit: Payer: Managed Care, Other (non HMO)

## 2019-11-14 ENCOUNTER — Other Ambulatory Visit: Payer: Self-pay

## 2019-11-14 DIAGNOSIS — R0683 Snoring: Secondary | ICD-10-CM

## 2019-11-14 DIAGNOSIS — G4733 Obstructive sleep apnea (adult) (pediatric): Secondary | ICD-10-CM

## 2019-11-16 ENCOUNTER — Encounter: Payer: Self-pay | Admitting: Family Medicine

## 2019-11-16 ENCOUNTER — Ambulatory Visit (INDEPENDENT_AMBULATORY_CARE_PROVIDER_SITE_OTHER): Payer: Managed Care, Other (non HMO) | Admitting: Family Medicine

## 2019-11-16 ENCOUNTER — Other Ambulatory Visit (HOSPITAL_COMMUNITY)
Admission: RE | Admit: 2019-11-16 | Discharge: 2019-11-16 | Disposition: A | Discharge status: Home / Self Care | Payer: Managed Care, Other (non HMO) | Source: Ambulatory Visit | Attending: Family Medicine | Admitting: Family Medicine

## 2019-11-16 ENCOUNTER — Other Ambulatory Visit: Payer: Self-pay

## 2019-11-16 ENCOUNTER — Ambulatory Visit (INDEPENDENT_AMBULATORY_CARE_PROVIDER_SITE_OTHER): Payer: Managed Care, Other (non HMO)

## 2019-11-16 VITALS — BP 130/80 | HR 74 | Temp 98.3°F | Resp 15 | Ht 66.0 in | Wt 179.8 lb

## 2019-11-16 DIAGNOSIS — Z789 Other specified health status: Secondary | ICD-10-CM | POA: Diagnosis not present

## 2019-11-16 DIAGNOSIS — Z1231 Encounter for screening mammogram for malignant neoplasm of breast: Secondary | ICD-10-CM

## 2019-11-16 DIAGNOSIS — Z0000 Encounter for general adult medical examination without abnormal findings: Secondary | ICD-10-CM | POA: Insufficient documentation

## 2019-11-16 DIAGNOSIS — M25551 Pain in right hip: Secondary | ICD-10-CM

## 2019-11-16 DIAGNOSIS — M25552 Pain in left hip: Secondary | ICD-10-CM

## 2019-11-16 DIAGNOSIS — Z1211 Encounter for screening for malignant neoplasm of colon: Secondary | ICD-10-CM

## 2019-11-16 DIAGNOSIS — Z124 Encounter for screening for malignant neoplasm of cervix: Secondary | ICD-10-CM

## 2019-11-16 DIAGNOSIS — Z1212 Encounter for screening for malignant neoplasm of rectum: Secondary | ICD-10-CM

## 2019-11-16 LAB — LIPID PANEL
Cholesterol: 171 mg/dL (ref 0–200)
HDL: 55 mg/dL (ref >39.00)
LDL Cholesterol: 101 mg/dL — ABNORMAL HIGH (ref 0–99)
NonHDL: 116.23
Total CHOL/HDL Ratio: 3
Triglycerides: 77 mg/dL (ref 0.0–149.0)
VLDL: 15.4 mg/dL (ref 0.0–40.0)

## 2019-11-16 LAB — COMPREHENSIVE METABOLIC PANEL
ALT: 20 U/L (ref 0–35)
AST: 19 U/L (ref 0–37)
Albumin: 4.6 g/dL (ref 3.5–5.2)
Alkaline Phosphatase: 49 U/L (ref 39–117)
BUN: 18 mg/dL (ref 6–23)
CO2: 28 mEq/L (ref 19–32)
Calcium: 9.6 mg/dL (ref 8.4–10.5)
Chloride: 104 mEq/L (ref 96–112)
Creatinine, Ser: 0.72 mg/dL (ref 0.40–1.20)
GFR: 85.73 mL/min (ref >60.00)
Glucose, Bld: 98 mg/dL (ref 70–99)
Potassium: 4 mEq/L (ref 3.5–5.1)
Sodium: 137 mEq/L (ref 135–145)
Total Bilirubin: 0.5 mg/dL (ref 0.2–1.2)
Total Protein: 7 g/dL (ref 6.0–8.3)

## 2019-11-16 LAB — TSH: TSH: 1.55 u[IU]/mL (ref 0.35–4.50)

## 2019-11-16 LAB — CBC WITH DIFFERENTIAL/PLATELET
Basophils Absolute: 0 10*3/uL (ref 0.0–0.1)
Basophils Relative: 1 % (ref 0.0–3.0)
Eosinophils Absolute: 0.1 10*3/uL (ref 0.0–0.7)
Eosinophils Relative: 3 % (ref 0.0–5.0)
HCT: 39.6 % (ref 36.0–46.0)
Hemoglobin: 14.1 g/dL (ref 12.0–15.0)
Lymphocytes Relative: 36.7 % (ref 12.0–46.0)
Lymphs Abs: 1.7 10*3/uL (ref 0.7–4.0)
MCHC: 35.7 g/dL (ref 30.0–36.0)
MCV: 94.8 fl (ref 78.0–100.0)
Monocytes Absolute: 0.3 10*3/uL (ref 0.1–1.0)
Monocytes Relative: 6.9 % (ref 3.0–12.0)
Neutro Abs: 2.5 10*3/uL (ref 1.4–7.7)
Neutrophils Relative %: 52.4 % (ref 43.0–77.0)
Platelets: 270 10*3/uL (ref 150.0–400.0)
RBC: 4.17 Mil/uL (ref 3.87–5.11)
RDW: 12.4 % (ref 11.5–15.5)
WBC: 4.7 10*3/uL (ref 4.0–10.5)

## 2019-11-16 LAB — VITAMIN B12: Vitamin B-12: 269 pg/mL (ref 211–911)

## 2019-11-16 IMAGING — DX DG HIP (WITH OR WITHOUT PELVIS) 3-4V BILAT
5 series · 5 of 5 positions shown · non-contrast
Comparison: No recent prior.

CLINICAL DATA: Bilateral hip pain.

EXAM:
DG HIP (WITH OR WITHOUT PELVIS) 3-4V BILAT

[pelvis ap]
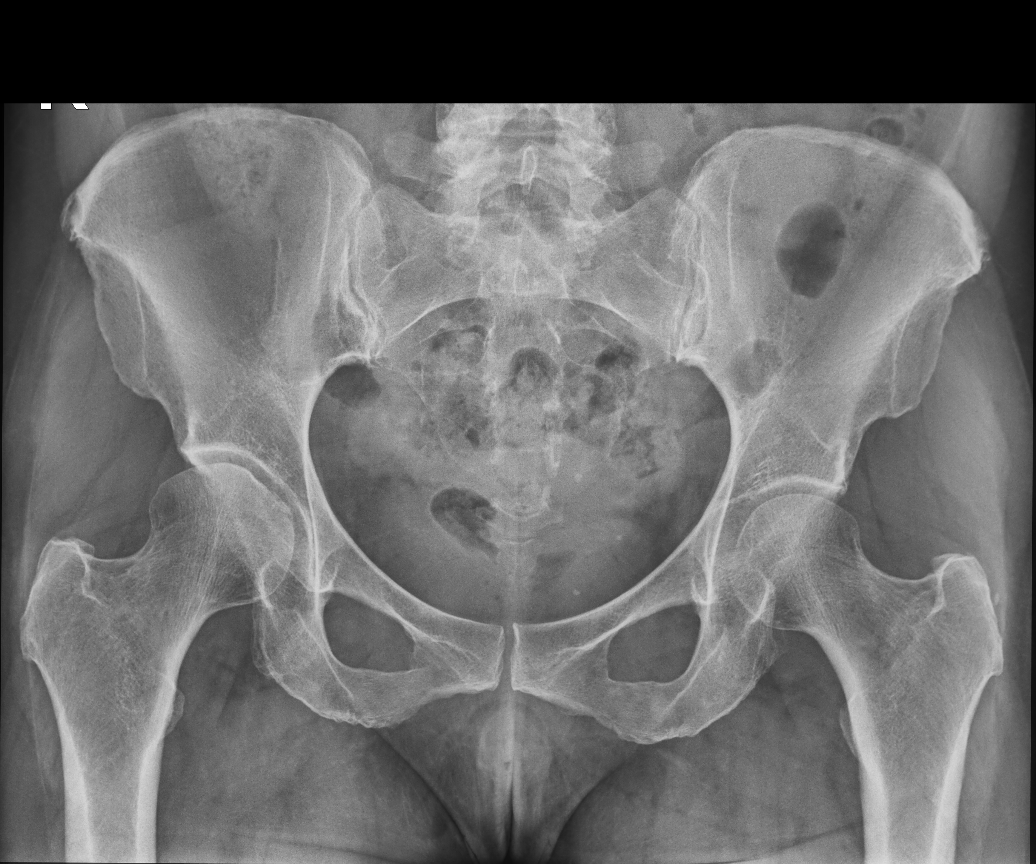

[hip joint ap (1 of 2)]
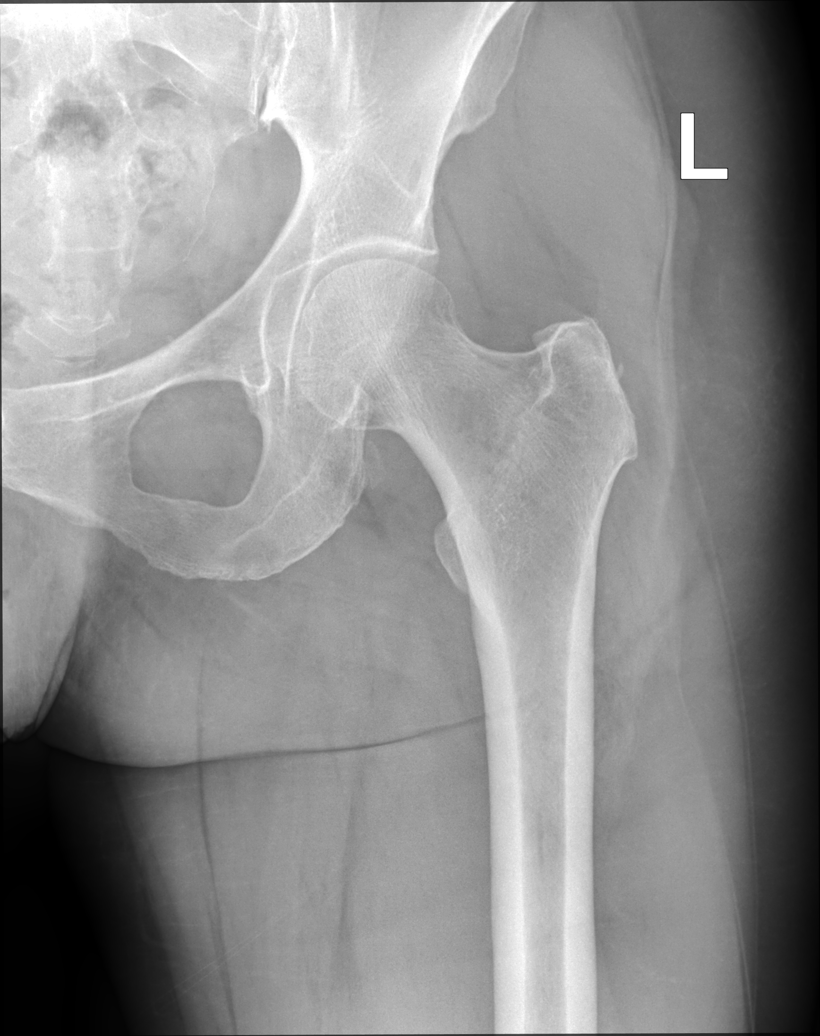

[hip joint (frog view) (1 of 2)]
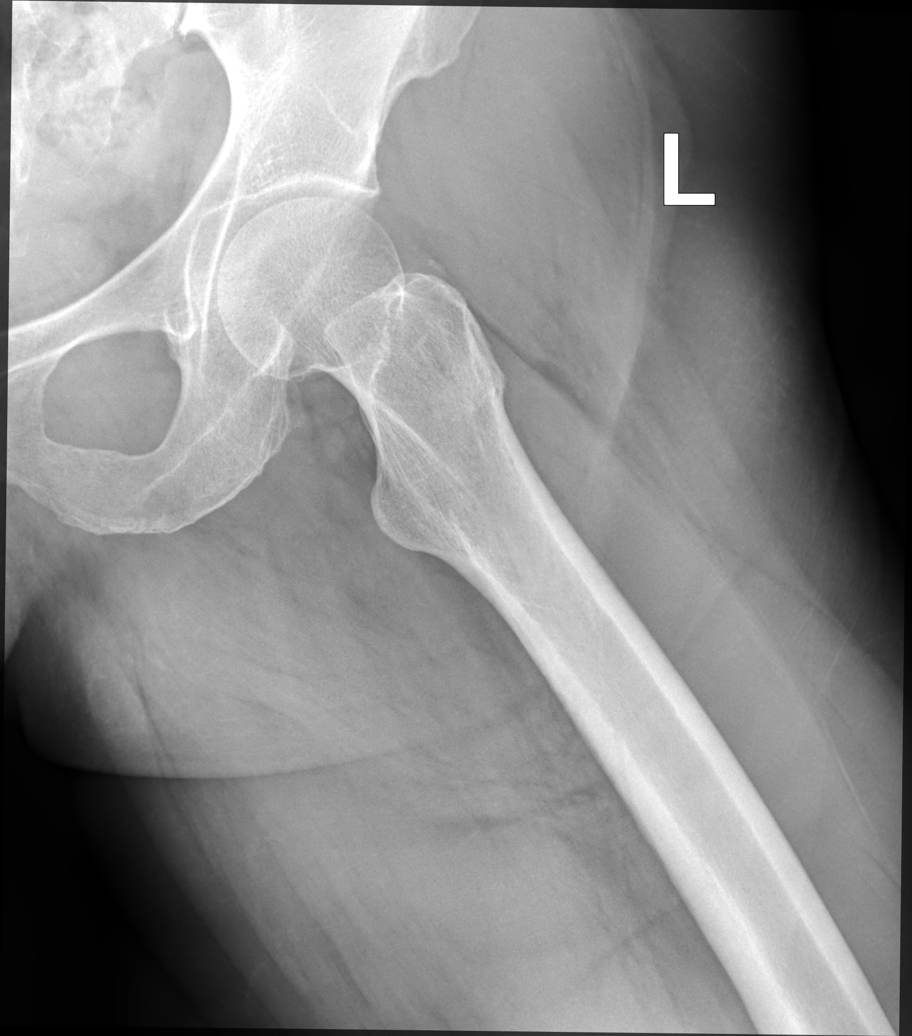

[hip joint ap (2 of 2)]
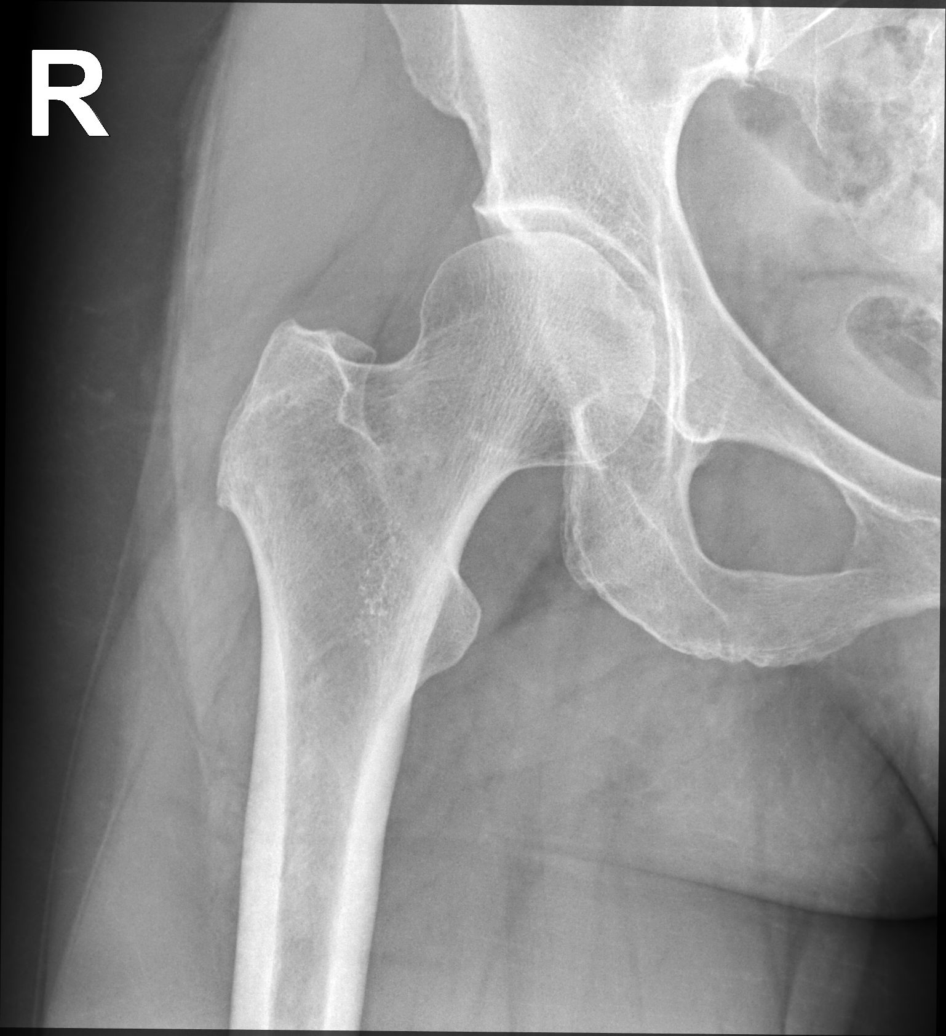

[hip joint (frog view) (2 of 2)]
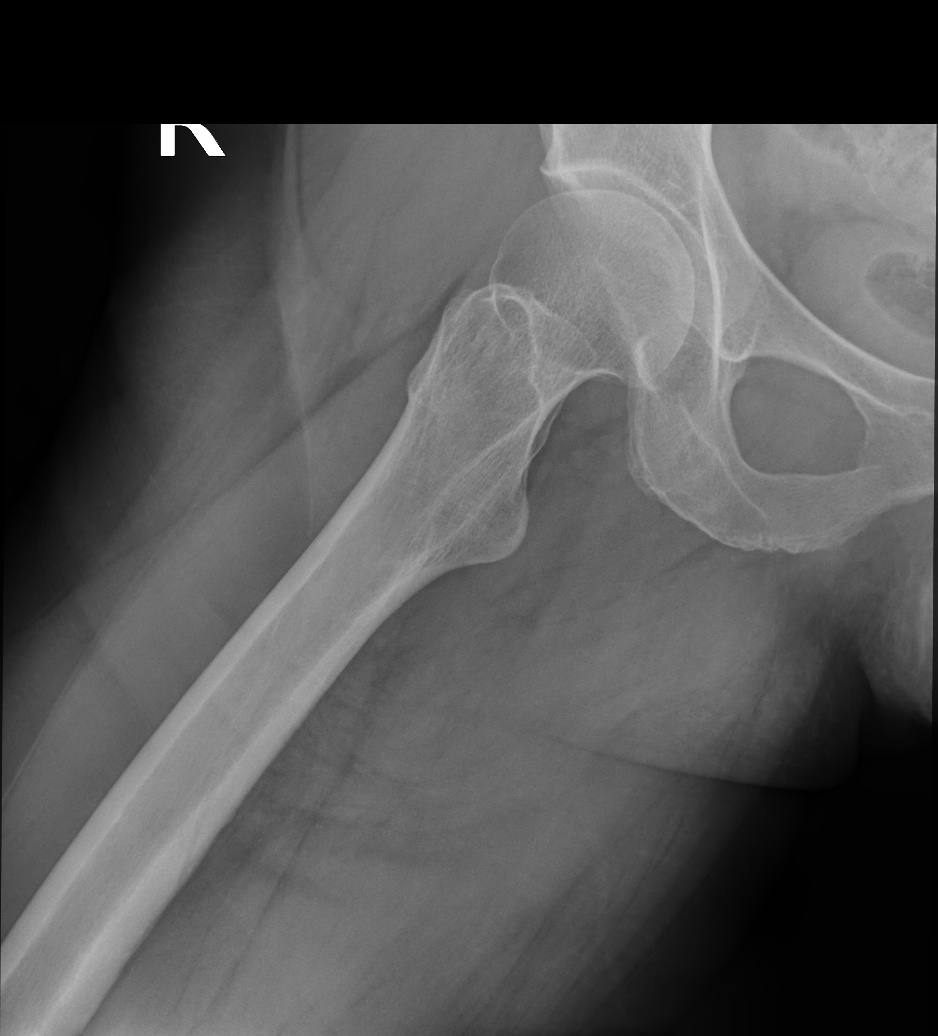

[5 of 5 positions shown; findings below may reference images not displayed]

FINDINGS: Degenerative changes lumbar spine and both hips. No acute bony
abnormality identified. No evidence of fracture. Pelvic
calcifications consistent phleboliths.
IMPRESSION: Degenerative changes lumbar spine and both hips. No acute bony
abnormality identified.

## 2019-11-16 NOTE — Patient Instructions (Signed)
Please return in 12 months for your annual complete physical; please come fasting.  I will release your lab results to you on your MyChart account with further instructions. Please reply with any questions.   I recommend the Cologuard test for your colon cancer screening that is due. I have ordered this test for you. [facility] will soon contact you to verify your insurance, address etc. They will then send you the kit; follow the instructions in the kit and return the kit to Colog[facility]. They will run the test and send the results to me. I will then give you the results. If this test is negative, we recommend repeating a colon cancer screening test in 3 years. If it is positive, I will refer you to a Gastroenterologist so you can get set up for the recommended colonoscopy.  Thank you!  I have ordered a mammogram and/or bone density for you as we discussed today: [x]   Mammogram  []   Bone Density  Please call the office checked below to schedule your appointment: Your appointment will at the following location  [x]   [facility]      [place], [place]         []   [facility]  [place], [place]  Make sure to wear two peace clothing  No lotions powders or deodorants the day of the appointment Make sure to bring picture ID and insurance card.  Bring list of medications you are currently taking including any supplements.    If you have any questions or concerns, please don't hesitate to send me a message via MyChart or call the office at [phone]. Thank you for visiting with [place] today! It's our pleasure caring for you.  Please do these things to maintain good health!   Exercise at least 30-45 minutes a day,  4-5 days a week.   Eat a low-fat diet with lots of fruits and vegetables, up to 7-9 servings per day.  Drink plenty of water daily. Try to drink 8 8oz glasses  per day.  Seatbelts can save your life. Always wear your seatbelt.  Place Smoke Detectors on every level of your home and check batteries every year.  Schedule an appointment with an eye doctor for an eye exam every 1-2 years  Safe sex - use condoms to protect yourself from STDs if you could be exposed to these types of infections. Use birth control if you do not want to become pregnant and are sexually active.  Avoid heavy alcohol use. If you drink, keep it to less than 2 drinks/day and not every day.  [place].  Choose someone you trust that could speak for you if you became unable to speak for yourself.  Depression is common in our stressful world.If you're feeling down or losing interest in things you normally enjoy, please come in for a visit.  If anyone is threatening or hurting you, please get help. Physical or Emotional Violence is never OK.

## 2019-11-16 NOTE — Progress Notes (Signed)
Subjective  Chief Complaint  Patient presents with  . Annual Exam    rewuesting pap, ongoing hip pain, random but occurs mostly when active    HPI: Ariana Walsh is a 49 y.o. female who presents to [facility] today for a Female Wellness Visit. She also has the concerns and/or needs as listed above in the chief complaint. These will be addressed in addition to the Health Maintenance Visit.   Wellness Visit: annual visit with health maintenance review and exam with Pap   HM: due pap, mammo, CRC screen. imms up to date. Vegetarian.  Chronic disease f/u and/or acute problem visit: (deemed necessary to be done in addition to the wellness visit):  C/o ongoing intermittent bilateral hip pain: sharp pulling sensation deep inside when makes sudden mvts like starting to run, or twisting. No pain with walking, lying on side; has full range of motion. No radicular sxs or back pain.   Assessment  1. Annual physical exam   2. Cervical cancer screening   3. Encounter for screening mammogram for breast cancer   4. Screening for colorectal cancer   5. Bilateral hip pain   6. Vegetarian diet      Plan  Female Wellness Visit:  Age appropriate Health Maintenance and Prevention measures were discussed with patient. Included topics are cancer screening recommendations, ways to keep healthy (see AVS) including dietary and exercise recommendations, regular eye and dental care, use of seat belts, and avoidance of moderate alcohol use and tobacco use. Pap w/ HR hpv today, cologuard elected. mammo to be scheduled  BMI: discussed patient's BMI and encouraged positive lifestyle modifications to help get to or maintain a target BMI.  HM needs and immunizations were addressed and ordered. See below for orders. See HM and immunization section for updates. utd  Routine labs and screening tests ordered including cmp, cbc and lipids where appropriate.  Discussed  recommendations regarding Vit D and calcium supplementation (see AVS)  Hip pain: atypical. Check xrays and monitor.    Follow up: Return in about 1 year (around 11/15/2020) for complete physical.  Orders Placed This Encounter  Procedures  . MM DIGITAL SCREENING BILATERAL  . DG HIPS BILAT WITH PELVIS 3-4 VIEWS  . CBC with Differential/Platelet  . Comprehensive metabolic panel  . Lipid panel  . TSH  . Cologuard  . Vitamin B12   No orders of the defined types were placed in this encounter.     Lifestyle: Body mass index is 29.02 kg/m. Wt Readings from Last 3 Encounters:  11/16/19 179 lb 12.8 oz (81.6 kg)  10/04/19 179 lb 9.6 oz (81.5 kg)  02/12/19 183 lb (83 kg)    Patient Active Problem List   Diagnosis Date Noted  . Snoring 10/04/2019  . Exercise-induced asthma 09/21/2018    Allergy induced also   . Perimenopausal symptoms 02/17/2018  . Umbilical hernia 12/30/2016    Dr. Rayburn Ma. Patient is waiting for a better time to have the surgery.   Marland Kitchen BMI 27.0-27.9,adult 12/30/2016  . 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    Health Maintenance  Topic Date Due  . MAMMOGRAM  06/06/2019  . PAP SMEAR-Modifier  09/10/2019  . COLONOSCOPY  Never done  . INFLUENZA VACCINE  02/10/2020  . TETANUS/TDAP  12/10/2021  . COVID-19 Vaccine  Completed  . HIV Screening  Completed   Immunization History  Administered Date(s) Administered  . PFIZER SARS-COV-2 Vaccination 09/08/2019, 09/29/2019   We updated  and reviewed the patient's past history in detail and it is documented below. Allergies: Patient is allergic to latex. Past Medical History Patient  has a past medical history of Exercise-induced asthma (09/21/2018) and Hernia, umbilical. Past Surgical History Patient  has a past surgical history that includes Dental surgery and IVF. Family History: Patient family history includes Arthritis in her father; Breast cancer in her mother; Cancer (age of onset: 14) in her father;  Diabetes in her father; Endometrial cancer in her mother; Healthy in her son; Heart disease in her father; Hypertension in her father and mother; Mental illness in her brother and father. Social History:  Patient  reports that she has never smoked. She has never used smokeless tobacco. She reports current alcohol use. She reports that she does not use drugs.  Review of Systems: Constitutional: negative for fever or malaise Ophthalmic: negative for photophobia, double vision or loss of vision Cardiovascular: negative for chest pain, dyspnea on exertion, or new LE swelling Respiratory: negative for SOB or persistent cough Gastrointestinal: negative for abdominal pain, change in bowel habits or melena Genitourinary: negative for dysuria or gross hematuria, no abnormal uterine bleeding or disharge Musculoskeletal: negative for new gait disturbance or muscular weakness Integumentary: negative for new or persistent rashes, no breast lumps Neurological: negative for TIA or stroke symptoms Psychiatric: negative for SI or delusions Allergic/Immunologic: negative for hives  Patient Care Team    Relationship Specialty Notifications Start End  [name], MD PCP - General Family Medicine  02/17/18   [name], MD Consulting Physician General Surgery  12/30/16     Objective  Vitals: BP 130/80   Pulse 74   Temp 98.3 F (36.8 C) (Temporal)   Resp 15   Ht 5\' 6"  (1.676 m)   Wt 179 lb 12.8 oz (81.6 kg)   SpO2 97%   BMI 29.02 kg/m  General:  Well developed, well nourished, no acute distress  Psych:  Alert and orientedx3,normal mood and affect HEENT:  Normocephalic, atraumatic, non-icteric sclera,  supple neck without adenopathy, mass or thyromegaly Cardiovascular:  Normal S1, S2, RRR without gallop, rub or murmur Respiratory:  Good breath sounds bilaterally, CTAB with normal respiratory effort Gastrointestinal: normal bowel sounds, soft, non-tender, no noted masses. No HSM MSK: no  deformities, contusions. Joints are without erythema or swelling.  Skin:  Warm, no rashes or suspicious lesions noted Neurologic:    Mental status is normal. CN 2-11 are normal. Gross motor and sensory exams are normal. Normal gait. No tremor Breast Exam: No mass, skin retraction or nipple discharge is appreciated in either breast. No axillary adenopathy. Fibrocystic changes are not noted Pelvic Exam: Normal external genitalia, no vulvar or vaginal lesions present. Clear cervix w/o CMT. Bimanual exam reveals a nontender fundus w/o masses, nl size. No adnexal masses present. No inguinal adenopathy. A PAP smear was performed.    Commons side effects, risks, benefits, and alternatives for medications and treatment plan prescribed today were discussed, and the patient expressed understanding of the given instructions. Patient is instructed to call or message via MyChart if he/she has any questions or concerns regarding our treatment plan. No barriers to understanding were identified. We discussed Red Flag symptoms and signs in detail. Patient expressed understanding regarding what to do in case of urgent or emergency type symptoms.   Medication list was reconciled, printed and provided to the patient in AVS. Patient instructions and summary information was reviewed with the patient as documented in the AVS. This note was  prepared with assistance of Systems analyst. Occasional wrong-word or sound-a-like substitutions may have occurred due to the inherent limitations of voice recognition software  This visit occurred during the SARS-CoV-2 public health emergency.  Safety protocols were in place, including screening questions prior to the visit, additional usage of staff PPE, and extensive cleaning of exam room while observing appropriate contact time as indicated for disinfecting solutions.

## 2019-11-19 DIAGNOSIS — G4733 Obstructive sleep apnea (adult) (pediatric): Secondary | ICD-10-CM

## 2019-11-20 ENCOUNTER — Encounter: Payer: Self-pay | Admitting: Family Medicine

## 2019-11-20 DIAGNOSIS — E538 Deficiency of other specified B group vitamins: Secondary | ICD-10-CM

## 2019-11-20 DIAGNOSIS — Z789 Other specified health status: Secondary | ICD-10-CM | POA: Insufficient documentation

## 2019-11-20 HISTORY — DX: Deficiency of other specified B group vitamins: E53.8

## 2019-11-20 LAB — CYTOLOGY - PAP
Comment: NEGATIVE
Diagnosis: NEGATIVE
Diagnosis: REACTIVE
High risk HPV: NEGATIVE

## 2019-11-22 ENCOUNTER — Encounter: Payer: Self-pay | Admitting: Family Medicine

## 2019-11-22 DIAGNOSIS — M160 Bilateral primary osteoarthritis of hip: Secondary | ICD-10-CM

## 2019-11-22 DIAGNOSIS — M47816 Spondylosis without myelopathy or radiculopathy, lumbar region: Secondary | ICD-10-CM | POA: Insufficient documentation

## 2019-11-22 HISTORY — DX: Bilateral primary osteoarthritis of hip: M16.0

## 2019-11-22 HISTORY — DX: Spondylosis without myelopathy or radiculopathy, lumbar region: M47.816

## 2019-12-03 ENCOUNTER — Other Ambulatory Visit: Payer: Self-pay

## 2019-12-03 ENCOUNTER — Ambulatory Visit
Admission: RE | Admit: 2019-12-03 | Discharge: 2019-12-03 | Disposition: A | Discharge status: Home / Self Care | Payer: Managed Care, Other (non HMO) | Source: Ambulatory Visit | Attending: Family Medicine | Admitting: Family Medicine

## 2019-12-03 ENCOUNTER — Other Ambulatory Visit: Payer: Self-pay | Admitting: Family Medicine

## 2019-12-03 DIAGNOSIS — Z1231 Encounter for screening mammogram for malignant neoplasm of breast: Secondary | ICD-10-CM

## 2019-12-03 DIAGNOSIS — Z0000 Encounter for general adult medical examination without abnormal findings: Secondary | ICD-10-CM

## 2019-12-03 IMAGING — MG DIGITAL SCREENING BILAT W/ TOMO W/ CAD
8 series · 8 of 24 positions shown · non-contrast
Comparison: Previous exam(s).

CLINICAL DATA: Screening.

EXAM:
DIGITAL SCREENING BILATERAL MAMMOGRAM WITH TOMO AND CAD

[R CC synth-2D]
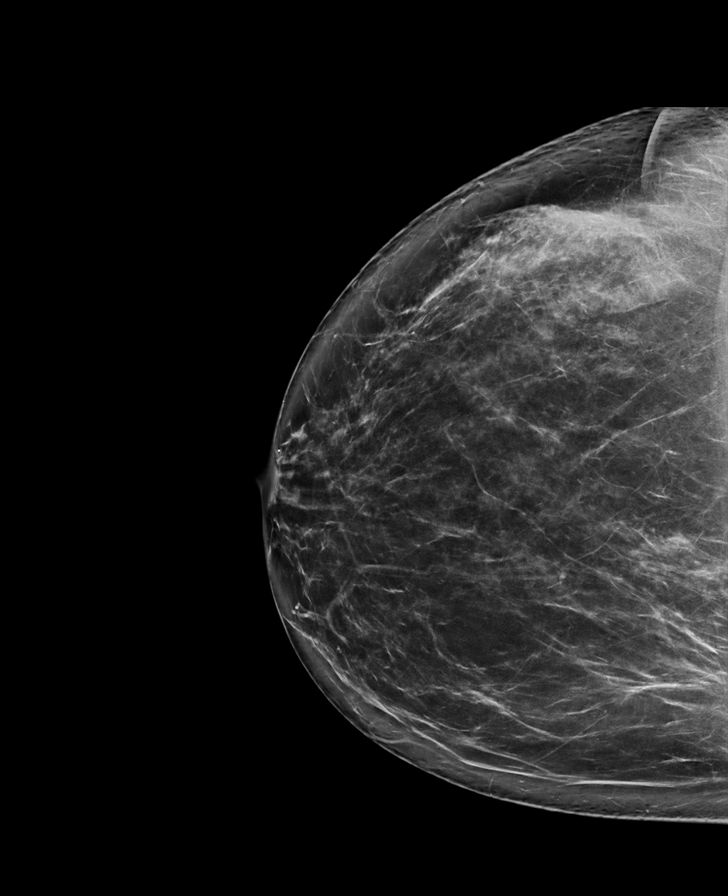

[L MLO synth-2D]
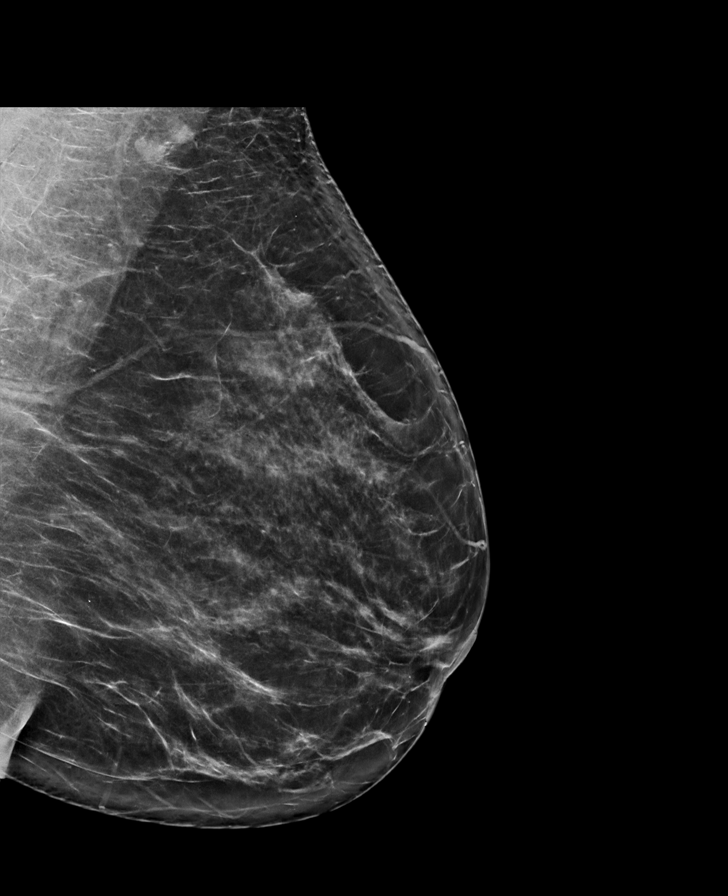

[L CC synth-2D]
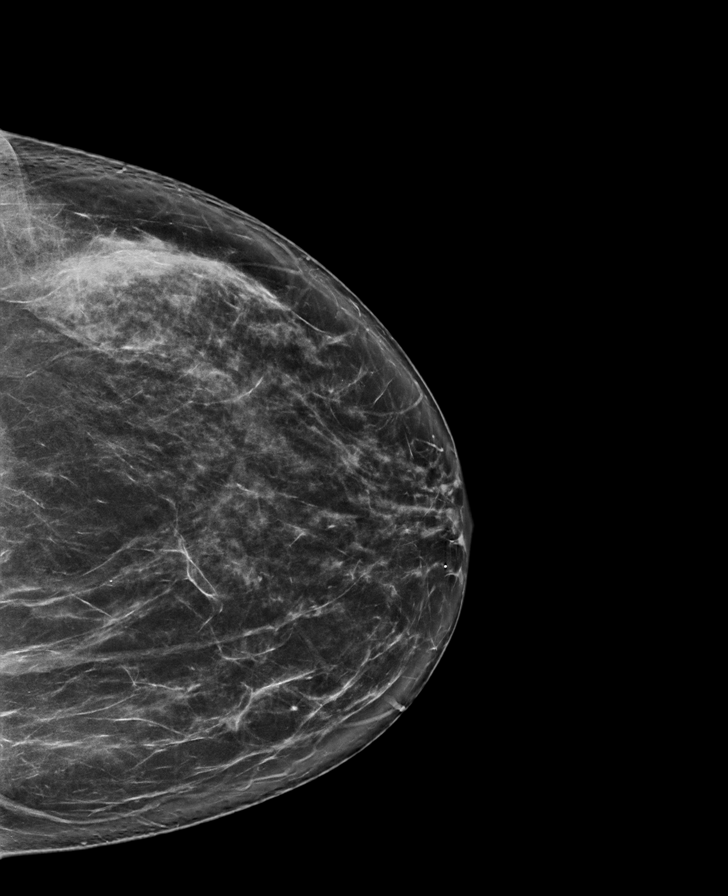

[R MLO synth-2D]
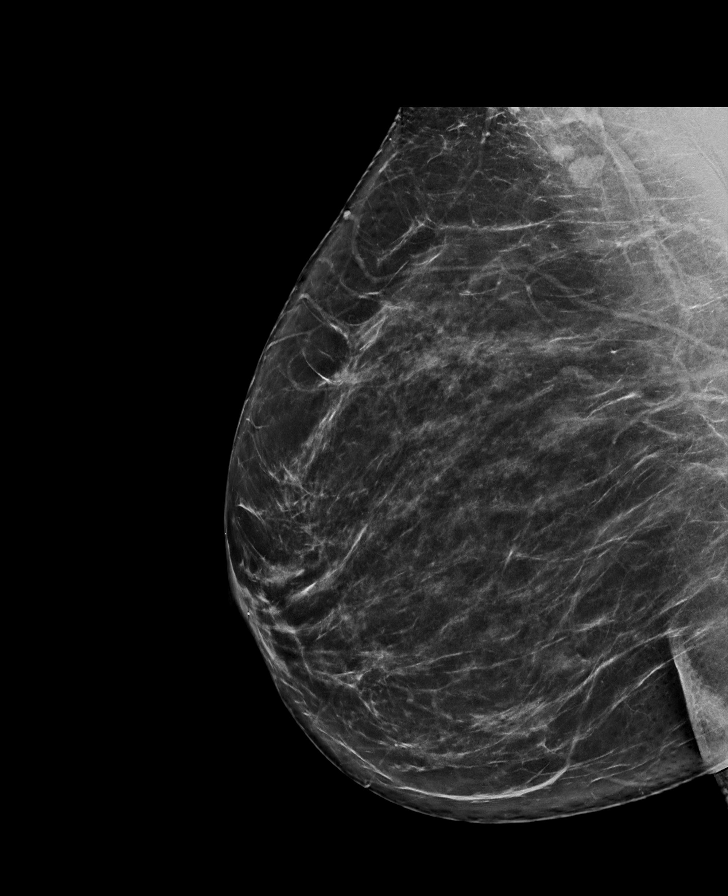

[L CC tomo · tomo slice 41/80.0]
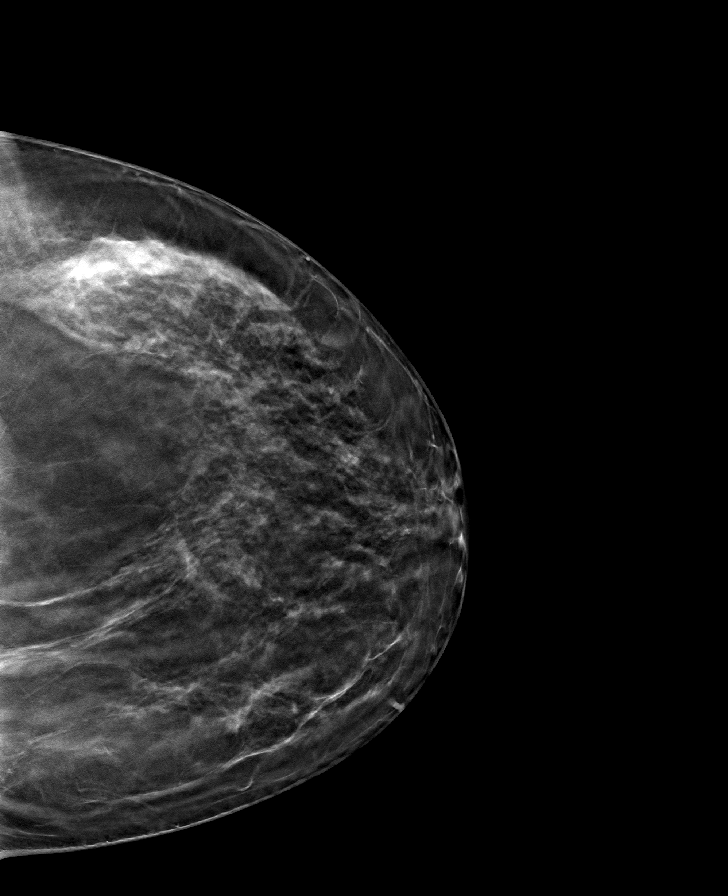

[R CC tomo · tomo slice 45/90.0]
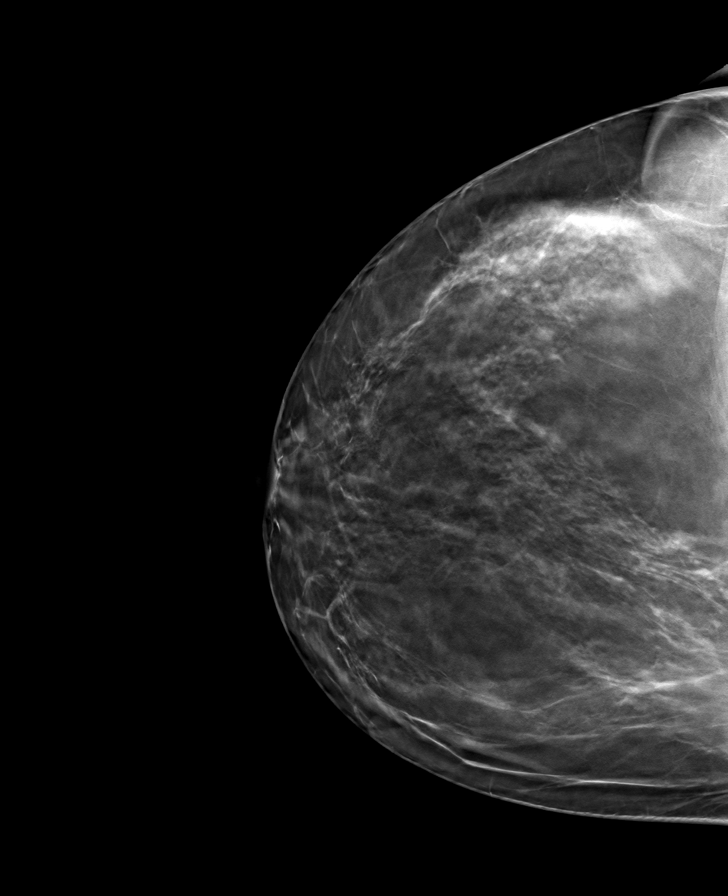

[R MLO tomo · tomo slice 45/89.0]
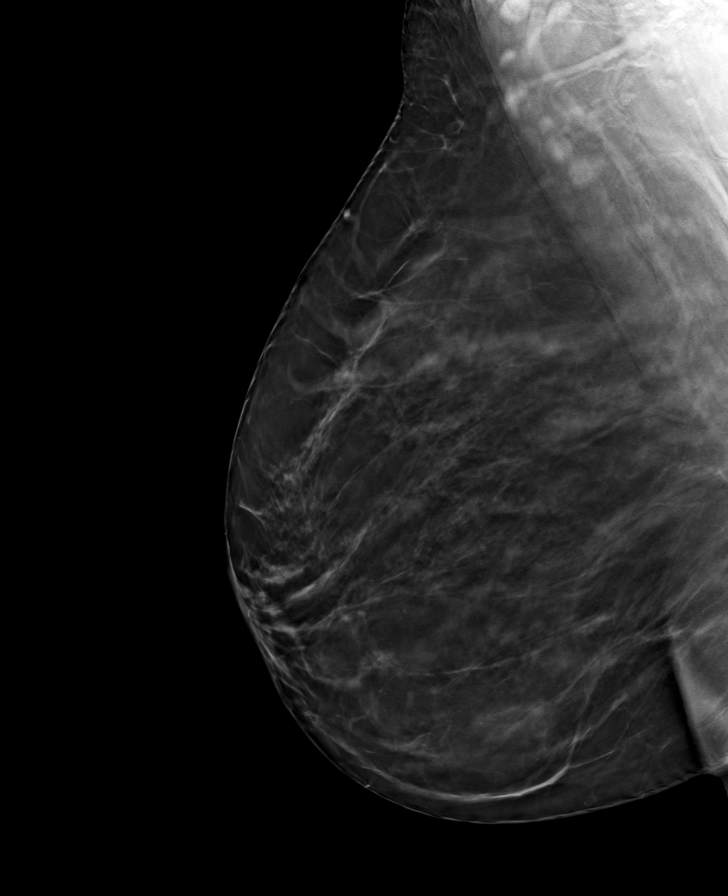

[L MLO tomo · tomo slice 42/83.0]
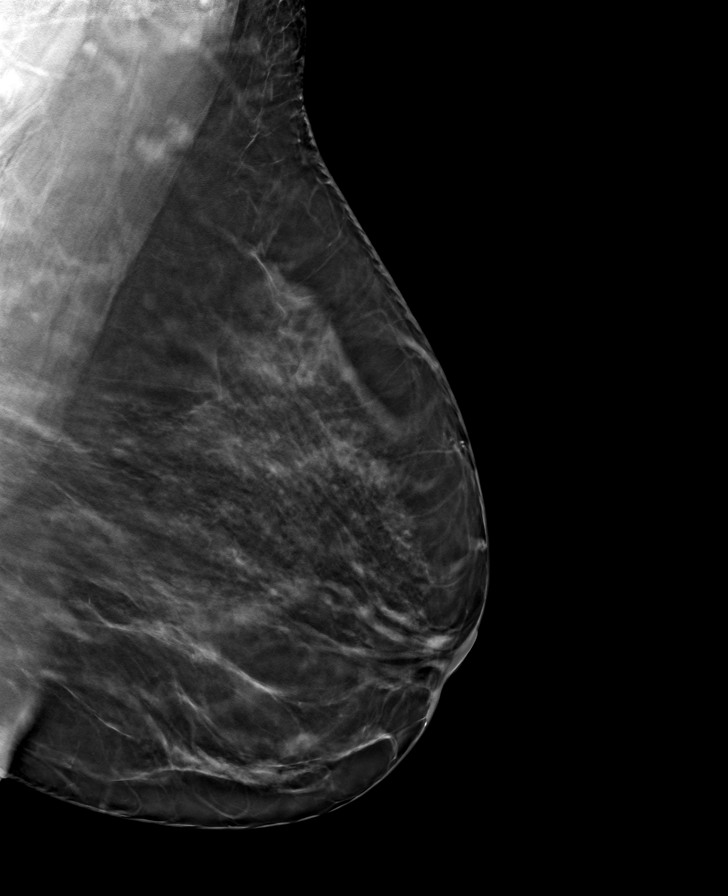

[8 of 24 positions shown; findings below may reference images not displayed]

ACR Breast Density Category c: The breast tissue is heterogeneously
dense, which may obscure small masses.
FINDINGS: There are no findings suspicious for malignancy. Images were
processed with CAD.
IMPRESSION: No mammographic evidence of malignancy. A result letter of this
screening mammogram will be mailed directly to the patient.

RECOMMENDATION:
Screening mammogram in one year. (Code:[5V])

BI-RADS CATEGORY  1: Negative.

## 2020-01-16 ENCOUNTER — Ambulatory Visit: Payer: Managed Care, Other (non HMO) | Admitting: Internal Medicine

## 2020-01-16 ENCOUNTER — Encounter: Payer: Self-pay | Admitting: Internal Medicine

## 2020-01-16 ENCOUNTER — Other Ambulatory Visit: Payer: Self-pay

## 2020-01-16 VITALS — BP 100/76 | HR 71 | Temp 98.3°F | Ht 66.0 in | Wt 184.2 lb

## 2020-01-16 DIAGNOSIS — R0683 Snoring: Secondary | ICD-10-CM

## 2020-01-16 DIAGNOSIS — J45990 Exercise induced bronchospasm: Secondary | ICD-10-CM

## 2020-01-16 DIAGNOSIS — G4733 Obstructive sleep apnea (adult) (pediatric): Secondary | ICD-10-CM | POA: Diagnosis not present

## 2020-01-16 NOTE — Progress Notes (Signed)
HPI F never smoker followed for OSA, complicated by  Asthma triggered by colds and exercise. Mild Seasonal Rhinitis. HST 11/14/19- AHI 17.3/ hr, desaturation to 80%, body weight 179 lbs  ------------------------------------------------------------------------------------------  10/04/19- 48 yoF never smoker for sleep evaluation on kind referral by Dr Mardelle Matte because of snoring. 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 Mild Seasonal Rhinitis. Body weight today 179 lbs Epworth score 13 Partner c/o her snoring. He is not with her every night. She says sometimes he wakes her because of her snoring. She feels she sleeps well. Maybe some daytime tiredness occ.   No sleep meds. 2 cups coffee, some tea or soda. Denies hx ENT surgery and has been told in past she had big tonsils. Denies other cardiopulmonary probs, seizures, thyroid disease. Some weight gain during Covid restrictions.  01/16/20- 49 yoF never smoker followed for OSA, complicated by  Asthma triggered by colds and exercise. Mild Seasonal Rhinitis. HST 11/14/19- AHI 17.3/ hr, desaturation to 80%, body weight 179 lbs Body weight today 184 lbs Reviewed home sleep test and discussed interventions. Her dentist is Dr Toni Arthurs, but she has some dental issues and agrees to start with CPAP. She travels frequently and goes camping- discussed in terms of electricity source. No recent wheeze or asthma flare. Very occasional use of albuterol hfa.  ROS-see HPI   + = positive Constitutional:    weight loss, night sweats, fevers, chills, fatigue, lassitude. HEENT:    headaches, difficulty swallowing, tooth/dental problems, sore throat,       sneezing, itching, ear ache, nasal congestion, post nasal drip, snoring CV:    chest pain, orthopnea, PND, swelling in lower extremities, anasarca,                                  dizziness, palpitations Resp:   shortness of breath with exertion or at  rest.                productive cough,   non-productive cough, coughing up of blood.              change in color of mucus.  wheezing.   Skin:    rash or lesions. GI:  No-   heartburn, indigestion, abdominal pain, nausea, vomiting, diarrhea,                 change in bowel habits, loss of appetite GU: dysuria, change in color of urine, no urgency or frequency.   flank pain. MS:   joint pain, stiffness, decreased range of motion, back pain. Neuro-     nothing unusual Psych:  change in mood or affect.  depression or anxiety.   memory loss.  OBJ- Physical Exam General- Alert, Oriented, Affect-appropriate, Distress- none acute, not obese Skin- rash-none, lesions- none, excoriation- none Lymphadenopathy- none Head- atraumatic            Eyes- Gross vision intact, PERRLA, conjunctivae and secretions clear            Ears- Hearing, canals-normal            Nose-  +Clear mucus, no-Septal dev, mucus, polyps, erosion, perforation             Throat- Mallampati II-III , mucosa clear , drainage- none, tonsils- atrophic Neck- flexible , trachea midline, no stridor , thyroid nl, carotid no bruit Chest - symmetrical excursion , unlabored  Heart/CV- RRR , no murmur , no gallop  , no rub, nl s1 s2                           - JVD- none , edema- none, stasis changes- none, varices- none           Lung- clear to P&A, wheeze- none, cough- none , dullness-none, rub- none           Chest wall-  Abd-  Br/ Gen/ Rectal- Not done, not indicated Extrem- cyanosis- none, clubbing, none, atrophy- none, strength- nl Neuro- grossly intact to observation

## 2020-01-16 NOTE — Patient Instructions (Signed)
Order- new DME, new CPAP auto 5-15, mask of choice, humidifier, supplies, AirView/ card  Please call if we can help 

## 2020-04-06 NOTE — Assessment & Plan Note (Signed)
Mild intermittent uncomplicated Plan- rescue inhaler sufficient for now. We can watch and help if needed.

## 2020-04-06 NOTE — Assessment & Plan Note (Signed)
Moderate OSA. Discussed treatment options. Her dentist is good at making oral appliances, but patient has reservations and chooses to try CPAP first. Some question of mobility for camping and she might end up trying an oral appliance. Plan - CPAP auto 5-15

## 2020-04-21 ENCOUNTER — Ambulatory Visit: Payer: Managed Care, Other (non HMO) | Admitting: Internal Medicine

## 2020-05-19 NOTE — Progress Notes (Signed)
HPI F never smoker followed for OSA, complicated by  Asthma triggered by colds and exercise. Mild Seasonal Rhinitis. HST 11/14/19- AHI 17.3/ hr, desaturation to 80%, body weight 179 lbs  ------------------------------------------------------------------------------------------   01/16/20- 49 yoF never smoker followed for OSA, complicated by  Asthma triggered by colds and exercise. Mild Seasonal Rhinitis. HST 11/14/19- AHI 17.3/ hr, desaturation to 80%, body weight 179 lbs Body weight today 184 lbs Reviewed home sleep test and discussed interventions. Her dentist is Dr Toni Arthurs, but she has some dental issues and agrees to start with CPAP. She travels frequently and goes camping- discussed in terms of electricity source. No recent wheeze or asthma flare. Very occasional use of albuterol hfa.  05/20/20- 49 yoF never smoker followed for OSA, complicated by  Asthma triggered by colds and exercise. Mild Seasonal Rhinitis. CPAP auto 5-15/ [employer] Download- compliance 100%, AHI 1.8/ hr Body weight today- 188 lbs Covid vax- 3 Phizer Flu vax- had -----pt is here cpap compliance.pt has issue with mask seal Using nasal mask. Mild leak but she wanted reassurance that she is in range. We reviewed download.  Good start. She wakes feeling "brighter".  ROS-see HPI   + = positive Constitutional:    weight loss, night sweats, fevers, chills, fatigue, lassitude. HEENT:    headaches, difficulty swallowing, tooth/dental problems, sore throat,       sneezing, itching, ear ache, nasal congestion, post nasal drip, snoring CV:    chest pain, orthopnea, PND, swelling in lower extremities, anasarca,                                  dizziness, palpitations Resp:   shortness of breath with exertion or at rest.                productive cough,   non-productive cough, coughing up of blood.              change in color of mucus.  wheezing.   Skin:    rash or lesions. GI:  No-   heartburn, indigestion, abdominal pain, nausea,  vomiting, diarrhea,                 change in bowel habits, loss of appetite GU: dysuria, change in color of urine, no urgency or frequency.   flank pain. MS:   joint pain, stiffness, decreased range of motion, back pain. Neuro-     nothing unusual Psych:  change in mood or affect.  depression or anxiety.   memory loss.  OBJ- Physical Exam General- Alert, Oriented, Affect-appropriate, Distress- none acute, not obese Skin- rash-none, lesions- none, excoriation- none Lymphadenopathy- none Head- atraumatic            Eyes- Gross vision intact, PERRLA, conjunctivae and secretions clear            Ears- Hearing, canals-normal            Nose-  +Clear mucus, no-Septal dev, mucus, polyps, erosion, perforation             Throat- Mallampati II-III , mucosa clear , drainage- none, tonsils- atrophic Neck- flexible , trachea midline, no stridor , thyroid nl, carotid no bruit Chest - symmetrical excursion , unlabored           Heart/CV- RRR , no murmur , no gallop  , no rub, nl s1 s2                           -  JVD- none , edema- none, stasis changes- none, varices- none           Lung- clear to P&A, wheeze- none, cough- none , dullness-none, rub- none           Chest wall-  Abd-  Br/ Gen/ Rectal- Not done, not indicated Extrem- cyanosis- none, clubbing, none, atrophy- none, strength- nl Neuro- grossly intact to observation

## 2020-05-20 ENCOUNTER — Other Ambulatory Visit: Payer: Self-pay

## 2020-05-20 ENCOUNTER — Ambulatory Visit: Payer: Managed Care, Other (non HMO) | Admitting: Internal Medicine

## 2020-05-20 ENCOUNTER — Encounter: Payer: Self-pay | Admitting: Internal Medicine

## 2020-05-20 DIAGNOSIS — J45990 Exercise induced bronchospasm: Secondary | ICD-10-CM | POA: Diagnosis not present

## 2020-05-20 DIAGNOSIS — G4733 Obstructive sleep apnea (adult) (pediatric): Secondary | ICD-10-CM

## 2020-05-20 NOTE — Patient Instructions (Signed)
We can continue CPAP auto 5-15, mask of choice, humidifier, supplies, Airview/ card  Please call if we can help 

## 2020-05-20 NOTE — Assessment & Plan Note (Signed)
Benefits from initial use of CPAP. Doing well. Her Dentist is Dr Toni Arthurs and I again said she could choose at any time for get a mouthpiece. She now uses bruxism guard. Plan- continue auto 5-15

## 2020-05-20 NOTE — Assessment & Plan Note (Signed)
Mild uncomplicated. Not currently a concern

## 2020-05-23 ENCOUNTER — Other Ambulatory Visit: Payer: Self-pay

## 2020-05-23 ENCOUNTER — Telehealth: Payer: Self-pay

## 2020-05-23 MED ORDER — ALBUTEROL SULFATE HFA 108 (90 BASE) MCG/ACT IN AERS: 2.0000 | INHALATION_SPRAY | RESPIRATORY_TRACT | 0 refills | Status: DC | PRN

## 2020-05-23 NOTE — Telephone Encounter (Signed)
Pt is calling and checking on this. She had refills on her inhaler, but since she hadn't gotten one for so long, the pharmacy says she needs a new prescription sent in. She got cats and believes they are triggering her asthma.

## 2020-05-23 NOTE — Telephone Encounter (Signed)
Scheduled an appointment on 06/02/20 to follow up on issue.

## 2020-05-23 NOTE — Telephone Encounter (Signed)
  LAST APPOINTMENT DATE: 11/16/2019   NEXT APPOINTMENT DATE:@Visit  date not found  MEDICATION: albuterol (PROVENTIL HFA;VENTOLIN HFA) 108 (90 Base) MCG/ACT inhaler  PHARMACY: [facility] - [place], [place] - [address]  Comments: Patient is requesting for two, has been having really bad asthma recently, and is asking possibly two inhalers but is really needing one for the weekend.    Please advise

## 2020-05-23 NOTE — Telephone Encounter (Signed)
Sent to [facility].

## 2020-05-28 ENCOUNTER — Other Ambulatory Visit: Payer: Self-pay | Admitting: Family Medicine

## 2020-06-02 ENCOUNTER — Other Ambulatory Visit: Payer: Self-pay

## 2020-06-02 ENCOUNTER — Ambulatory Visit: Payer: Managed Care, Other (non HMO) | Admitting: Family Medicine

## 2020-06-02 ENCOUNTER — Encounter: Payer: Self-pay | Admitting: Family Medicine

## 2020-06-02 VITALS — BP 151/76 | HR 70 | Temp 98.0°F | Ht 65.0 in | Wt 190.0 lb

## 2020-06-02 DIAGNOSIS — J45909 Unspecified asthma, uncomplicated: Secondary | ICD-10-CM | POA: Insufficient documentation

## 2020-06-02 DIAGNOSIS — Z1212 Encounter for screening for malignant neoplasm of rectum: Secondary | ICD-10-CM | POA: Diagnosis not present

## 2020-06-02 DIAGNOSIS — J4520 Mild intermittent asthma, uncomplicated: Secondary | ICD-10-CM | POA: Insufficient documentation

## 2020-06-02 DIAGNOSIS — Z1211 Encounter for screening for malignant neoplasm of colon: Secondary | ICD-10-CM | POA: Diagnosis not present

## 2020-06-02 DIAGNOSIS — J4530 Mild persistent asthma, uncomplicated: Secondary | ICD-10-CM | POA: Diagnosis not present

## 2020-06-02 MED ORDER — PEAK FLOW METER DEVI: 0 refills | Status: DC

## 2020-06-02 MED ORDER — CETIRIZINE HCL 10 MG PO TABS: 10.0000 mg | ORAL_TABLET | Freq: Every day | ORAL | 11 refills | Status: DC

## 2020-06-02 MED ORDER — MONTELUKAST SODIUM 10 MG PO TABS: 10.0000 mg | ORAL_TABLET | Freq: Every day | ORAL | 3 refills | Status: DC

## 2020-06-02 NOTE — Progress Notes (Signed)
Subjective  CC:  Chief Complaint  Patient presents with  . Asthma    HPI: Ariana Walsh is a 49 y.o. female who presents to the office today to address the problems listed above in the chief complaint.  Asthma: pt with longstanding exercise induced asthma that has been well controlled. However, also with allergy induced asthma: got 2 kittens and now with cough, wheezing, itching and needing to use albuterol more frequently . No hives. Sob is improved with albuterol. Last use last night. No productive cough. + rhinorrhea.   Due for crc screen. Assessment  1. 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 Pt wants to keep cats; start zyrtec and singulair. Check peak flows and monitor. Recheck 6 weeks. Albuterol prn.   cologuard ordered  Follow up: 6 weeks for recheck.   Visit date not found  Orders Placed This Encounter  Procedures  . Cologuard   Meds ordered this encounter  Medications  . montelukast (SINGULAIR) 10 MG tablet    Sig: Take 1 tablet (10 mg total) by mouth at bedtime.    Dispense:  30 tablet    Refill:  3  . cetirizine (ZYRTEC) 10 MG tablet    Sig: Take 1 tablet (10 mg total) by mouth daily.    Dispense:  30 tablet    Refill:  11  . Peak Flow Meter DEVI    Sig: Use as needed to check your breathing. Log your values    Dispense:  1 each    Refill:  0      I reviewed the patients updated PMH, FH, and SocHx.    Patient Active Problem List   Diagnosis Date Noted  . Extrinsic asthma 06/02/2020  . DJD (degenerative joint disease), lumbar 11/22/2019  . Osteoarthritis, hip, bilateral 11/22/2019  . Vitamin B12 deficiency 11/20/2019  . Vegetarian 11/20/2019  . OSA (obstructive sleep apnea) 10/04/2019  . Exercise-induced asthma 09/21/2018  . Perimenopausal symptoms 02/17/2018  . Umbilical hernia 12/30/2016  . BMI 27.0-27.9,adult 12/30/2016   . Dystrophic nail 12/30/2016   Current Meds  Medication Sig  . PROAIR HFA 108 (90 Base) MCG/ACT inhaler USE 2 PUFFS EVERY 4 HOURS AS NEEDED FOR COUGH OR SHORTNESS OF BREATH.    Allergies: Patient is allergic to latex. Family History: Patient family history includes Arthritis in her father; Breast cancer in her mother; Cancer (age of onset: 31) in her father; Diabetes in her father; Endometrial cancer in her mother; Healthy in her son; Heart disease in her father; Hypertension in her father and mother; Mental illness in her brother and father. Social History:  Patient  reports that she has never smoked. She has never used smokeless tobacco. She reports current alcohol use. She reports that she does not use drugs.  Review of Systems: Constitutional: Negative for fever malaise or anorexia Cardiovascular: negative for chest pain Respiratory: negative for SOB or persistent cough Gastrointestinal: negative for abdominal pain  Objective  Vitals: BP (!) 151/76 Comment: Pt complains of lef thand hurting when taking BP.  Pulse 70   Temp 98 F (36.7 C) (Temporal)   Ht 5\' 5"  (1.651 m)   Wt 190 lb (86.2 kg)   SpO2 96%   BMI 31.62 kg/m  General: no acute distress , A&Ox3 HEENT: PEERL, conjunctiva normal, neck is supple, nasal congestion present.  Cardiovascular:  RRR without murmur or  gallop.  Respiratory:  Good breath sounds bilaterally, CTAB with normal respiratory effort, no wheeze Skin:  Warm, no rashes, cat scratches on arms     Commons side effects, risks, benefits, and alternatives for medications and treatment plan prescribed today were discussed, and the patient expressed understanding of the given instructions. Patient is instructed to call or message via MyChart if he/she has any questions or concerns regarding our treatment plan. No barriers to understanding were identified. We discussed Red Flag symptoms and signs in detail. Patient expressed understanding regarding what to do  in case of urgent or emergency type symptoms.   Medication list was reconciled, printed and provided to the patient in AVS. Patient instructions and summary information was reviewed with the patient as documented in the AVS. This note was prepared with assistance of Dragon voice recognition software. Occasional wrong-word or sound-a-like substitutions may have occurred due to the inherent limitations of voice recognition software  This visit occurred during the SARS-CoV-2 public health emergency.  Safety protocols were in place, including screening questions prior to the visit, additional usage of staff PPE, and extensive cleaning of exam room while observing appropriate contact time as indicated for disinfecting solutions.

## 2020-06-02 NOTE — Patient Instructions (Signed)
Please return in 6 weeks to recheck asthma.  If you have any questions or concerns, please don't hesitate to send me a message via MyChart or call the office at [phone]. Thank you for visiting with [place] today! It's our pleasure caring for you.   Peak Flow Meter  A peak flow meter is a device that helps you determine how well your asthma is being controlled. The device measures the flow of air out of your lungs. This is a simple but important tool in daily asthma management. Peak flow meters are available over the counter. The readings from the meter will help you and your health care provider:  Determine the severity of your asthma.  Evaluate the effectiveness of your current treatment.  Determine when to add or stop certain medicines.  Recognize an asthma attack before signs or symptoms appear.  Decide when to seek emergency care. What are the risks?  Dizziness. Breathing too quickly into the peak flow meter may cause dizziness. This could cause you to pass out. Take your time so you do not get dizzy or light-headed.  False readings. Not cleaning your meter may cause false results. You may not know that your asthma is getting better or getting worse, making you to start or stop treatment improperly. How to find your personal best Your "personal best" is the highest peak flow rate you can reach when you feel good and have no asthma symptoms. This can be used as your standard for comparing your peak flow meter readings. Because everyone's asthma is different, your personal best will be unique to you. Your health care provider will help you to figure out your personal best. Typically, you will take readings once or twice a day for 2 weeks when you are not having symptoms. The highest reading during the trial period is your personal best. Because your lung function can change over time, your personal best should be measured each year. How to use your peak flow meter 1. Move the upper  marker to the number that is your personal best. 2. Move the lower marker to the bottom of the numbered scale. 3. Connect the mouthpiece to the peak flow meter. 4. Stand up. 5. Take a deep breath. Make sure you fill your lungs completely. 6. Place your lips tightly around the mouthpiece. Blow as hard and as fast as you can with a single breath (forced exhalation), as if you are blowing out candles. If your lips are not placed tightly around the mouthpiece of the peak flow meter, you will get incorrect low readings. 7. At the end of your forced exhale, check to see what number the lower marker landed on. This is your peak flow rate. 8. Move the lower marker back to the bottom of the numbered scale. 9. Repeat these steps 2 more times. Record the highest reading of the 3 tries in your asthma diary. Do not calculate the average for your 3 tries, just record the highest. Always write down the results in your asthma diary. After using your peak flow meter, rest and breathe slowly and easily. Keep a record of your progress. Your health care provider can provide you with a simple table to help with this. For the most accurate readings, it is important to keep your peak flow meter clean. Follow the manufacturer's instructions on how to take care of your peak flow meter. Your health care provider will give you instructions on when to do regular monitoring. You may also need to  check your peak flow when:  Coughing, wheezing, or other asthma symptoms wake you up at night.  Your asthma symptoms worsen during the day.  Your breathing is made worse because of a cold, flu, or other respiratory illness.  You need to use your quick-relief, "rescue medicine." It is best to check your peak flow rate before you use rescue medicine, and then 20-30 minutes afterward. This will help determine whether you need to take the rescue medicine again. How to use your results Use color-coded zones on the meter to see how your  peak flow rate compares to your personal best. If your peak flow readings fall too far below your personal best into the yellow or red zone, you will need to take action to prevent or minimize an asthma attack. The color code for each zone reflects progressively more severe symptoms: Green zone: Good   Peak flow rate between 80% and 100% of your personal best. Your asthma is under good control when your peak flow rate is in the green zone.  When you are in the green zone, you are not likely to be experiencing any asthma symptoms.  Only take your regular, preventive medicine. Your rescue medicine is not required. Yellow zone: Caution   Peak flow rate between 50% and 80% of your personal best. Your asthma is getting worse and could be improved.  You may have begun coughing, wheezing, or feeling chest tightness. Sometimes peak flow rates dip down into the yellow zone before asthma symptoms appear.  Consider increasing or changing your asthma medicine. This may include using your rescue medicine. If you have an asthma action plan, follow each step listed for the yellow zone, including medicine changes. Red zone: Warning   Peak flow rate below 50% of your personal best. This may mean you are having, or are at risk of having, a medical emergency.  Your coughing, wheezing, or shortness of breath may have become severe. Do not wait to use your rescue medicine. Stop whatever you are doing and use your rescue inhaler, nebulizer, or other medicines to open your airways.  If you have an asthma action plan, follow each step listed for the red zone, including medicine changes and when to seek emergency medical care. Contact a health care provider if: You are in the yellow zone. If you have an asthma action plan, follow all of the steps listed under the yellow zone section of the plan. Let your health care provider know that you are in the yellow zone. Get help right away if: You are in the red zone. If  you have an asthma action plan, follow all of the steps listed under the red zone section of the plan while you are seeking immediate medical care. Summary  A peak flow meter is a device that helps you determine how well your asthma is being controlled. The device measures the flow of air out of your lungs.  Readings from the meter will help you determine the severity of your asthma, whether your treatment is working, and when to start or stop treatment.  Measure your personal best every year during periods of no symptoms. The meter will compare this reading to your regular readings to determine your condition at a given time.  Work with your health care provider to understand what each zone (green, yellow, red) means and what actions to take in each zone. This information is not intended to replace advice given to you by your health care provider. Make  sure you discuss any questions you have with your health care provider. Document Revised: 06/10/2017 Document Reviewed: 06/24/2016 Elsevier Patient Education  2020 [employer].

## 2020-07-18 ENCOUNTER — Encounter: Payer: Self-pay | Admitting: Family Medicine

## 2020-07-18 ENCOUNTER — Ambulatory Visit: Payer: Managed Care, Other (non HMO) | Admitting: Family Medicine

## 2020-07-18 ENCOUNTER — Other Ambulatory Visit: Payer: Self-pay

## 2020-07-18 ENCOUNTER — Telehealth (INDEPENDENT_AMBULATORY_CARE_PROVIDER_SITE_OTHER): Payer: Managed Care, Other (non HMO) | Admitting: Family Medicine

## 2020-07-18 DIAGNOSIS — J4530 Mild persistent asthma, uncomplicated: Secondary | ICD-10-CM | POA: Diagnosis not present

## 2020-07-18 NOTE — Progress Notes (Signed)
Virtual Visit via Video Note  Subjective  CC:  Chief Complaint  Patient presents with  . Asthma    Just wants to follow up from what was discussed before.      I connected with Ariana Walsh on 07/21/20 at  4:00 PM EST by a video enabled telemedicine application and verified that I am speaking with the correct person using two identifiers. Location patient: Home Location provider: [facility] [address], Office Persons participating in the virtual visit: Asalee Barrette Sara, [name], MD Adela Glimpse CMA  I discussed the limitations of evaluation and management by telemedicine and the availability of in person appointments. The patient expressed understanding and agreed to proceed. HPI: Ariana Walsh is a 50 y.o. female who was contacted today to address the problems listed above in the chief complaint.  Asthma f/u: Allergic component responded very nicely to the addition of Singulair and an antihistamine.  Within 48 hours after her last visit, her symptoms resolved.  She reports her asthma is now well controlled.  She admits she did not check too many peak flows.  When she last checked the max peak flow numbers were around 300.  She does not believe that she is ever done better than 400.  Overall she feels well.  Assessment  1. 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  She will start monitoring her peak flows to ensure that we have right medications.  If peak flows remain low we will add a maintenance inhaler.  Patient stands and agrees.  Continue allergy medicines  Follow up: may for cpe     The patient was advised to call back or seek an in-person evaluation if the symptoms worsen or if the condition fails to improve as anticipated. Follow up: Return in about 4 months (around 11/15/2020) for complete physical.  Visit date not  found  No orders of the defined types were placed in this encounter.     I reviewed the patients updated PMH, FH, and SocHx.    Patient Active Problem List   Diagnosis Date Noted  . Extrinsic asthma 06/02/2020  . DJD (degenerative joint disease), lumbar 11/22/2019  . Osteoarthritis, hip, bilateral 11/22/2019  . Vitamin B12 deficiency 11/20/2019  . Vegetarian 11/20/2019  . OSA (obstructive sleep apnea) 10/04/2019  . Exercise-induced asthma 09/21/2018  . Perimenopausal symptoms 02/17/2018  . Umbilical hernia 12/30/2016  . BMI 27.0-27.9,adult 12/30/2016  . Dystrophic nail 12/30/2016   Current Meds  Medication Sig  . cetirizine (ZYRTEC) 10 MG tablet Take 1 tablet (10 mg total) by mouth daily.  . montelukast (SINGULAIR) 10 MG tablet Take 1 tablet (10 mg total) by mouth at bedtime.  . Peak Flow Meter DEVI Use as needed to check your breathing. Log your values  . PROAIR HFA 108 (90 Base) MCG/ACT inhaler USE 2 PUFFS EVERY 4 HOURS AS NEEDED FOR COUGH OR SHORTNESS OF BREATH.    Allergies: Patient is allergic to latex. Family History: Patient family history includes Arthritis in her father; Breast cancer in her mother; Cancer (age of onset: 36) in her father; Diabetes in her father; Endometrial cancer in her mother; Healthy in her son; Heart disease in her father; Hypertension in her father and mother; Mental illness in her brother and father. Social History:  Patient  reports that she has never smoked. She has never used smokeless tobacco.  She reports current alcohol use. She reports that she does not use drugs.  Review of Systems: Constitutional: Negative for fever malaise or anorexia Cardiovascular: negative for chest pain Respiratory: negative for SOB or persistent cough Gastrointestinal: negative for abdominal pain  OBJECTIVE Vitals: There were no vitals taken for this visit. General: no acute distress , A&Ox3  [name], MD

## 2020-10-01 ENCOUNTER — Other Ambulatory Visit: Payer: Self-pay | Admitting: Family Medicine

## 2020-10-01 NOTE — Telephone Encounter (Signed)
  LAST APPOINTMENT DATE: 07/18/20   NEXT APPOINTMENT DATE:@Visit  date not found  MEDICATION:montelukast (SINGULAIR) 10 MG tablet  PHARMACY:[facility] - [place], [place] - [address]  Comments: Patient is calling in stating that she ahs already taken her last pill.  Please advise

## 2020-10-26 LAB — COLOGUARD: Cologuard: NEGATIVE

## 2020-10-31 LAB — EXTERNAL GENERIC LAB PROCEDURE: COLOGUARD: NEGATIVE

## 2020-10-31 LAB — COLOGUARD
COLOGUARD: NEGATIVE
Cologuard: NEGATIVE

## 2020-11-03 ENCOUNTER — Other Ambulatory Visit: Payer: Self-pay | Admitting: Family Medicine

## 2020-11-03 DIAGNOSIS — Z1231 Encounter for screening mammogram for malignant neoplasm of breast: Secondary | ICD-10-CM

## 2020-11-19 NOTE — Progress Notes (Signed)
HPI F never smoker followed for OSA, complicated by  Asthma triggered by colds and exercise. Mild Seasonal Rhinitis. HST 11/14/19- AHI 17.3/ hr, desaturation to 80%, body weight 179 lbs  ------------------------------------------------------------------------------------------  05/20/20- 49 yoF never smoker followed for OSA, complicated by  Asthma triggered by colds and exercise. Mild Seasonal Rhinitis. CPAP auto 5-1 Covid vax- 3 Phizer5/ [employer] Download- compliance 100%, AHI 1.8/ hr Body weight today- 188 lbs Flu vax- had -----pt is here cpap compliance.pt has issue with mask seal Using nasal mask. Mild leak but she wanted reassurance that she is in range. We reviewed download.  Good start. She wakes feeling "brighter".  11/20/20- 50 yoF never smoker followed for OSA, complicated by  Asthma triggered by colds and exercise. Mild Seasonal Rhinitis. CPAP auto 5-15/  [employer] Download- compliance 97%, AHI 1.7/ hr Body weight today-201 lbs Covid vax-3 Phizer -----Was having allergies to new kittens, since starting singular and zyrtec that has helped. Pressure was too much, so adjusted herslf. Now not too sure if its enough Sleeping better. Likes CPAP. She adjusted her ramp- not the pressure range. Asks about travel CPAP. Discussed Covid boosters.   ROS-see HPI   + = positive Constitutional:    weight loss, night sweats, fevers, chills, fatigue, lassitude. HEENT:    headaches, difficulty swallowing, tooth/dental problems, sore throat,       sneezing, itching, ear ache, nasal congestion, post nasal drip, snoring CV:    chest pain, orthopnea, PND, swelling in lower extremities, anasarca,                                   dizziness, palpitations Resp:   shortness of breath with exertion or at rest.                productive cough,   non-productive cough, coughing up of blood.              change in color of mucus.  wheezing.   Skin:    rash or lesions. GI:  No-   heartburn, indigestion, abdominal  pain, nausea, vomiting, diarrhea,                 change in bowel habits, loss of appetite GU: dysuria, change in color of urine, no urgency or frequency.   flank pain. MS:   joint pain, stiffness, decreased range of motion, back pain. Neuro-     nothing unusual Psych:  change in mood or affect.  depression or anxiety.   memory loss.  OBJ- Physical Exam General- Alert, Oriented, Affect-appropriate, Distress- none acute, not obese Skin- rash-none, lesions- none, excoriation- none Lymphadenopathy- none Head- atraumatic            Eyes- Gross vision intact, PERRLA, conjunctivae and secretions clear            Ears- Hearing, canals-normal            Nose-  +Clear mucus, no-Septal dev, mucus, polyps, erosion, perforation             Throat- Mallampati II-III , mucosa clear , drainage- none, tonsils- atrophic Neck- flexible , trachea midline, no stridor , thyroid nl, carotid no bruit Chest - symmetrical excursion , unlabored           Heart/CV- RRR , no murmur , no gallop  , no rub, nl s1 s2                           -  JVD- none , edema- none, stasis changes- none, varices- none           Lung- clear to P&A, wheeze- none, cough- none , dullness-none, rub- none           Chest wall-  Abd-  Br/ Gen/ Rectal- Not done, not indicated Extrem- cyanosis- none, clubbing, none, atrophy- none, strength- nl Neuro- grossly intact to observation

## 2020-11-20 ENCOUNTER — Encounter: Payer: Self-pay | Admitting: Internal Medicine

## 2020-11-20 ENCOUNTER — Ambulatory Visit: Payer: Managed Care, Other (non HMO) | Admitting: Internal Medicine

## 2020-11-20 ENCOUNTER — Other Ambulatory Visit: Payer: Self-pay

## 2020-11-20 DIAGNOSIS — J4530 Mild persistent asthma, uncomplicated: Secondary | ICD-10-CM | POA: Diagnosis not present

## 2020-11-20 DIAGNOSIS — G4733 Obstructive sleep apnea (adult) (pediatric): Secondary | ICD-10-CM

## 2020-11-20 NOTE — Patient Instructions (Signed)
We can continue CPAP auto 5-15  You can certainly explore travel CPAP machines like AirMini and Transcend, batteries intended for travel use, etc  From [employer] and from on-line companies like [email or web address]-knight.info/.  Happy to help you as needed.  Please call if we can help

## 2020-12-09 ENCOUNTER — Encounter: Payer: Self-pay | Admitting: Family Medicine

## 2020-12-23 ENCOUNTER — Ambulatory Visit: Payer: Managed Care, Other (non HMO)

## 2021-01-26 ENCOUNTER — Encounter: Payer: Self-pay | Admitting: Family Medicine

## 2021-01-26 ENCOUNTER — Ambulatory Visit (INDEPENDENT_AMBULATORY_CARE_PROVIDER_SITE_OTHER): Payer: Managed Care, Other (non HMO) | Admitting: Family Medicine

## 2021-01-26 ENCOUNTER — Other Ambulatory Visit: Payer: Self-pay

## 2021-01-26 VITALS — BP 150/80 | HR 65 | Temp 98.6°F | Ht 66.0 in | Wt 194.2 lb

## 2021-01-26 DIAGNOSIS — E538 Deficiency of other specified B group vitamins: Secondary | ICD-10-CM

## 2021-01-26 DIAGNOSIS — J4530 Mild persistent asthma, uncomplicated: Secondary | ICD-10-CM | POA: Diagnosis not present

## 2021-01-26 DIAGNOSIS — Z789 Other specified health status: Secondary | ICD-10-CM

## 2021-01-26 DIAGNOSIS — K224 Dyskinesia of esophagus: Secondary | ICD-10-CM

## 2021-01-26 DIAGNOSIS — M160 Bilateral primary osteoarthritis of hip: Secondary | ICD-10-CM | POA: Diagnosis not present

## 2021-01-26 DIAGNOSIS — Z0000 Encounter for general adult medical examination without abnormal findings: Secondary | ICD-10-CM

## 2021-01-26 DIAGNOSIS — K760 Fatty (change of) liver, not elsewhere classified: Secondary | ICD-10-CM | POA: Diagnosis not present

## 2021-01-26 DIAGNOSIS — E669 Obesity, unspecified: Secondary | ICD-10-CM | POA: Insufficient documentation

## 2021-01-26 DIAGNOSIS — K439 Ventral hernia without obstruction or gangrene: Secondary | ICD-10-CM

## 2021-01-26 DIAGNOSIS — M47816 Spondylosis without myelopathy or radiculopathy, lumbar region: Secondary | ICD-10-CM

## 2021-01-26 DIAGNOSIS — L858 Other specified epidermal thickening: Secondary | ICD-10-CM | POA: Insufficient documentation

## 2021-01-26 DIAGNOSIS — G4733 Obstructive sleep apnea (adult) (pediatric): Secondary | ICD-10-CM

## 2021-01-26 MED ORDER — VANIQA 13.9 % EX CREA
TOPICAL_CREAM | CUTANEOUS | 11 refills | Status: DC
Start: 2021-01-26 — End: 2021-12-08

## 2021-01-26 NOTE — Patient Instructions (Addendum)
Please return in 6 months to recheck blood pressure.   I will release your lab results to you on your MyChart account with further instructions. Please reply with any questions.    If you have any questions or concerns, please don't hesitate to send me a message via MyChart or call the office at [phone]. Thank you for visiting with [place] today! It's our pleasure caring for you.   If Vaniqa cream is used, results can start after 4 weeks of use.   Let me know if you need help with your neck pain or spasm.  Esophageal Spasm  An esophageal spasm is a sudden tightening (contraction) of the esophagus, which is the part of the body that moves food from the mouth to the stomach. Normally, smooth, wave-like muscle contractions move food and liquids down the esophagus. Esophageal spasms are abnormal muscle contractions that can cause chest pain and trouble swallowing (dysphagia). Spasms may also cause swallowed foods or liquids to come back up into the throat (regurgitation). There are two types of esophageal spasms. You may have one or both types: Diffuse esophageal spasms. These are irregular, uncoordinated spasms. This type tends to cause more dysphagia. Nutcracker esophagus. This is a type of spasm in which the muscles move normally, but the contraction is very strong. This type tends to be more painful. Severe esophageal spasms can make it hard to eat and do everyday activities. They often occur with severe heartburn (reflux esophagitis). The symptoms can come and go and may be triggered or worsened depending onyour diet or other medical issues. What are the causes? The cause of esophageal spasms is not known. What increases the risk? The following factors may make you more likely to develop esophageal spasms: Being female. Age. The risk may increase as you get older. Depression or anxiety. Having gastroesophageal reflux disease (GERD). What are the signs or symptoms? Symptoms may vary from  day to day. They may be mild or severe. They may last for minutes or hours. Common symptoms include: Chest pain. This may feel like a heart attack. Back pain. Dysphagia. Heartburn. A feeling that something is stuck in the throat (globus). Regurgitation of foods or liquids. For some people, certain things may trigger symptoms, such as: Certain foods and drinks. These may include very hot or very cold foods or drinks. Eating very quickly. How is this diagnosed? This condition may be diagnosed based on your symptoms and a physical exam. You may have tests, such as: Endoscopy. This involves using a flexible tube that has a camera on the end of it (endoscope) to look down your throat and examine your esophagus. Barium swallow. For this test, you drink a substance that will show up well on X-rays (barium) and then have X-rays to see how the substance moves through your esophagus. Esophageal manometry. This involves passing a small, thin tube through your nose and down into your throat. The tube contains pressure sensors that measure muscle contractions in the esophagus while you swallow. How is this treated? Mild esophageal spasms may not need treatment. You may be able to manage the spasms by avoiding triggers. For more frequent or severe spasms, treatment may include: Medicine to: Relax the esophageal muscles. Relieve muscle spasms (calcium channel blockers and nitrates). Relieve pain by blocking nerve endings in the esophagus. This is done with an injection of a toxin (botulinum). Relieve heartburn (proton pump inhibitors). Antidepressant medicines. These are sometimes used to ease symptoms. Surgery to reduce esophageal muscle contractions (myotomy). This  may be needed for very severe cases. Follow these instructions at home: Eating and drinking Keep track of foods, drinks, and habits that trigger spasms or heartburn. Avoid these triggers as much as you can. Eat meals slowly. Chew food  completely before swallowing. Avoid swallowing foods and drinks when they are very hot or very cold. General instructions Take over-the-counter and prescription medicines only as told by your health care provider. Find ways to manage stress, such as regular exercise or meditation. If you struggle with depression or anxiety, talk with your health care provider about treatment options. Keep all follow-up visits. This is important. Contact a health care provider if: Your symptoms get worse or do not get better with medicine. You are losing weight because of dysphagia. Your esophageal spasms affect your quality of life, such as your ability to eat. Get help right away if: You have severe chest pain. You have chest pain that is different from your usual chest pain. You have trouble breathing. You choke. These symptoms may represent a serious problem that is an emergency. Do not wait to see if the symptoms will go away. Get medical help right away. Call your local emergency services (911 in the [place] Do not drive yourself to the hospital. Summary An esophageal spasm is a sudden tightening (contraction) of the esophagus, which is the part of the body that moves food from the mouth to the stomach. These abnormal muscle contractions can cause chest pain and trouble swallowing (dysphagia). The cause of esophageal spasms is not known. Treatment may not be needed for mild spasms. For frequent or more severe spasms, treatment may include medicine, or, for very severe spasms, surgery. Keep track of foods, drinks, and habits that trigger spasms or heartburn. Avoid these triggers as much as you can. This information is not intended to replace advice given to you by your health care provider. Make sure you discuss any questions you have with your healthcare provider. Document Revised: 02/16/2020 Document Reviewed: 02/16/2020 Elsevier Patient Education  2022 [employer].

## 2021-01-26 NOTE — Progress Notes (Signed)
Subjective  Chief Complaint  Patient presents with   Annual Exam   Referral    Gen surgery; Hernia   Immunizations    Discuss Shingles    HPI: Ariana Walsh is a 50 y.o. female who presents to [facility] [place] today for a Female Wellness Visit. She also has the concerns and/or needs as listed above in the chief complaint. These will be addressed in addition to the Health Maintenance Visit.   Wellness Visit: annual visit with health maintenance review and exam without Pap  HM: mammo is scheduled soon. Other screens are up to date. Eye exam up to date. Overall doing well. Chronic disease f/u and/or acute problem visit: (deemed necessary to be done in addition to the wellness visit): Ventral hernia: incarcerated. ED visit last week: ultrasound showed incarcerated hernia: CT scan showed defect containing edematous intraperitoneal fat. Hernia is not reducible. Pain has subsided. Needs surgical evaluation.  Fatty liver noted on CT scan. Weight gain and now with mildly elevated blood pressure. Working on improving diet and exercise.  Hips and back with OA; pain has stablilized. No neuro sxs Due for b12 check.  Has several instances of what sounds like esophageal spasm: acute sharp tightening pain substernal associated with eating/swallowing. Lasts only minutes. Has happened about twice in the last month. Has h/o GERD: pain is similar but more severe, sharper. No chronic gerd sxs. No hematemesis. Nl appetite.  C/o acne like changes on upper arms and thighs.  OSA: evaluated by pulm. Wearing cpap at night.  C/o course hair on chin and lower jaw. Would like to discuss treatment options.  Asthma: consistent peak flows 350s. Rare albuterol. Feels well.   Assessment  1. Annual physical exam   2. Mild persistent extrinsic asthma without complication   3. Spondylosis of lumbar region without myelopathy or radiculopathy   4. Primary osteoarthritis of both hips   5.  Vitamin B12 deficiency   6. OSA (obstructive sleep apnea)   7. Obesity (BMI 30-39.9)   8. Keratosis pilaris   9. Ventral hernia without obstruction or gangrene   10. Hepatic steatosis   11. Esophageal spasm      Plan  Female Wellness Visit: Age appropriate Health Maintenance and Prevention measures were discussed with patient. Included topics are cancer screening recommendations, ways to keep healthy (see AVS) including dietary and exercise recommendations, regular eye and dental care, use of seat belts, and avoidance of moderate alcohol use and tobacco use. mamm BMI: discussed patient's BMI and encouraged positive lifestyle modifications to help get to or maintain a target BMI. HM needs and immunizations were addressed and ordered. See below for orders. See HM and immunization section for updates. Shingles todahe Routine labs and screening tests ordered including cmp, cbc and lipids where appropriate. Discussed recommendations regarding Vit D and calcium supplementation (see AVS)  Chronic disease management visit and/or acute problem visit: Asthma: midly low peak flows but no sxs. Continue prn albuterol. Elevated bp with weight gain: low salt low fat diet and weight loss. Recheck next visit.  Hepatic steatosis: education given. Low fat diet Ventral hernia: refer to surgery for repair. Understands sxs of risks of incarceration.  Esophageal spasm: monitor for now. Rare sxs. Weight loss. Non exertional and related to eating Chin hirsutism: vaniqa. Education given.  Recheck labs/b12  Follow up: 6 months for recheck weight and bp  Orders Placed This Encounter  Procedures   CBC with Differential/Platelet   Comprehensive metabolic panel  Lipid panel   Vitamin B12   TSH   Hepatitis C antibody   Ambulatory referral to General Surgery   Meds ordered this encounter  Medications   Eflornithine HCl (VANIQA) 13.9 % cream    Sig: Apply to area twice a day    Dispense:  45 g    Refill:   11      Body mass index is 31.34 kg/m. Wt Readings from Last 3 Encounters:  01/26/21 194 lb 3.2 oz (88.1 kg)  11/20/20 201 lb (91.2 kg)  06/02/20 190 lb (86.2 kg)     Patient Active Problem List   Diagnosis Date Noted   Obesity (BMI 30-39.9) 01/26/2021   Keratosis pilaris 01/26/2021   Extrinsic asthma 06/02/2020   DJD (degenerative joint disease), lumbar 11/22/2019    Xray 11/2019     Osteoarthritis, hip, bilateral 11/22/2019    xrays 11/2019     Vitamin B12 deficiency 11/20/2019   Vegetarian 11/20/2019   OSA (obstructive sleep apnea) 10/04/2019    HST 11/14/19- AHI 17.3/ hr, desaturation to 80%, body weight 179 lbs    Exercise-induced asthma 09/21/2018    Allergy induced also     Perimenopausal symptoms 02/17/2018   Umbilical hernia 12/30/2016    Dr. Rayburn Ma. Patient is waiting for a better time to have the surgery.     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     Health Maintenance  Topic Date Due   Hepatitis C Screening  Never done   Zoster Vaccines- Shingrix (1 of 2) Never done   MAMMOGRAM  12/02/2020   COVID-19 Vaccine (4 - Booster for Pfizer series) 02/11/2021 (Originally 08/05/2020)   INFLUENZA VACCINE  02/09/2021   TETANUS/TDAP  12/10/2021   Fecal DNA (Cologuard)  11/01/2023   PAP SMEAR-Modifier  11/15/2024   HIV Screening  Completed   Pneumococcal Vaccine 43-60 Years old  Aged Out   HPV VACCINES  Aged Out   Immunization History  Administered Date(s) Administered   Influenza-Unspecified 03/26/2020   PFIZER(Purple Top)SARS-COV-2 Vaccination 09/08/2019, 09/29/2019, 04/05/2020   We updated and reviewed the patient's past history in detail and it is documented below. Allergies: Patient is allergic to latex. Past Medical History Patient  has a past medical history of DJD (degenerative joint disease), lumbar (11/22/2019), Exercise-induced asthma (09/21/2018), Hernia, umbilical, Osteoarthritis, hip, bilateral (11/22/2019), and Vitamin B12 deficiency  (11/20/2019). Past Surgical History Patient  has a past surgical history that includes Dental surgery and IVF. Family History: Patient family history includes Arthritis in her father; Breast cancer in her mother; Cancer (age of onset: 23) in her father; Diabetes in her father; Endometrial cancer in her mother; Healthy in her son; Heart disease in her father; Hypertension in her father and mother; Mental illness in her brother and father. Social History:  Patient  reports that she has never smoked. She has never used smokeless tobacco. She reports current alcohol use. She reports that she does not use drugs.  Review of Systems: Constitutional: negative for fever or malaise Ophthalmic: negative for photophobia, double vision or loss of vision Cardiovascular: negative for chest pain, dyspnea on exertion, or new LE swelling Respiratory: negative for SOB or persistent cough Gastrointestinal: negative for abdominal pain, change in bowel habits or melena Genitourinary: negative for dysuria or gross hematuria, no abnormal uterine bleeding or disharge Musculoskeletal: negative for new gait disturbance or muscular weakness Integumentary: negative for new or persistent rashes, no breast lumps Neurological: negative for TIA or stroke symptoms Psychiatric: negative for SI  or delusions Allergic/Immunologic: negative for hives  Patient Care Team    Relationship Specialty Notifications Start End  [name], MD PCP - General Family Medicine  02/17/18   [name], MD Consulting Physician General Surgery  12/30/16     Objective  Vitals: BP (!) 150/80   Pulse 65   Temp 98.6 F (37 C) (Temporal)   Ht 5\' 6"  (1.676 m)   Wt 194 lb 3.2 oz (88.1 kg)   SpO2 95%   BMI 31.34 kg/m  General:  Well developed, well nourished, no acute distress  Psych:  Alert and orientedx3,normal mood and affect HEENT:  Normocephalic, atraumatic, non-icteric sclera,  supple neck without adenopathy, mass or  thyromegaly Cardiovascular:  Normal S1, S2, RRR without gallop, rub or murmur Respiratory:  Good breath sounds bilaterally, CTAB with normal respiratory effort Gastrointestinal: normal bowel sounds, soft, non-tender, no noted masses. No HSM MSK: no deformities, contusions. Joints are without erythema or swelling.  Skin:  Warm, no rashes or suspicious lesions noted, chin scarring from hair removal.  Neurologic:    Mental status is normal. CN 2-11 are normal. Gross motor and sensory exams are normal. Normal gait. No tremor   Commons side effects, risks, benefits, and alternatives for medications and treatment plan prescribed today were discussed, and the patient expressed understanding of the given instructions. Patient is instructed to call or message via MyChart if he/she has any questions or concerns regarding our treatment plan. No barriers to understanding were identified. We discussed Red Flag symptoms and signs in detail. Patient expressed understanding regarding what to do in case of urgent or emergency type symptoms.  Medication list was reconciled, printed and provided to the patient in AVS. Patient instructions and summary information was reviewed with the patient as documented in the AVS. This note was prepared with assistance of Dragon voice recognition software. Occasional wrong-word or sound-a-like substitutions may have occurred due to the inherent limitations of voice recognition software  This visit occurred during the SARS-CoV-2 public health emergency.  Safety protocols were in place, including screening questions prior to the visit, additional usage of staff PPE, and extensive cleaning of exam room while observing appropriate contact time as indicated for disinfecting solutions.

## 2021-01-27 LAB — TSH: TSH: 2.57 u[IU]/mL (ref 0.35–5.50)

## 2021-01-27 LAB — LIPID PANEL
Cholesterol: 154 mg/dL (ref 0–200)
HDL: 44.1 mg/dL (ref >39.00)
LDL Cholesterol: 79 mg/dL (ref 0–99)
NonHDL: 110.02
Total CHOL/HDL Ratio: 3
Triglycerides: 156 mg/dL — ABNORMAL HIGH (ref 0.0–149.0)
VLDL: 31.2 mg/dL (ref 0.0–40.0)

## 2021-01-27 LAB — VITAMIN B12: Vitamin B-12: 477 pg/mL (ref 211–911)

## 2021-01-27 LAB — CBC WITH DIFFERENTIAL/PLATELET
Basophils Absolute: 0 10*3/uL (ref 0.0–0.1)
Basophils Relative: 0.8 % (ref 0.0–3.0)
Eosinophils Absolute: 0.1 10*3/uL (ref 0.0–0.7)
Eosinophils Relative: 1.6 % (ref 0.0–5.0)
HCT: 39.8 % (ref 36.0–46.0)
Hemoglobin: 14.2 g/dL (ref 12.0–15.0)
Lymphocytes Relative: 32.1 % (ref 12.0–46.0)
Lymphs Abs: 1.9 10*3/uL (ref 0.7–4.0)
MCHC: 35.5 g/dL (ref 30.0–36.0)
MCV: 93.6 fl (ref 78.0–100.0)
Monocytes Absolute: 0.4 10*3/uL (ref 0.1–1.0)
Monocytes Relative: 6 % (ref 3.0–12.0)
Neutro Abs: 3.5 10*3/uL (ref 1.4–7.7)
Neutrophils Relative %: 59.5 % (ref 43.0–77.0)
Platelets: 307 10*3/uL (ref 150.0–400.0)
RBC: 4.26 Mil/uL (ref 3.87–5.11)
RDW: 11.8 % (ref 11.5–15.5)
WBC: 5.9 10*3/uL (ref 4.0–10.5)

## 2021-01-27 LAB — COMPREHENSIVE METABOLIC PANEL
ALT: 22 U/L (ref 0–35)
AST: 18 U/L (ref 0–37)
Albumin: 4.6 g/dL (ref 3.5–5.2)
Alkaline Phosphatase: 50 U/L (ref 39–117)
BUN: 14 mg/dL (ref 6–23)
CO2: 23 mEq/L (ref 19–32)
Calcium: 9.6 mg/dL (ref 8.4–10.5)
Chloride: 103 mEq/L (ref 96–112)
Creatinine, Ser: 0.76 mg/dL (ref 0.40–1.20)
GFR: 90.85 mL/min (ref >60.00)
Glucose, Bld: 80 mg/dL (ref 70–99)
Potassium: 3.9 mEq/L (ref 3.5–5.1)
Sodium: 136 mEq/L (ref 135–145)
Total Bilirubin: 0.5 mg/dL (ref 0.2–1.2)
Total Protein: 7.1 g/dL (ref 6.0–8.3)

## 2021-01-27 LAB — HEPATITIS C ANTIBODY
Hepatitis C Ab: NONREACTIVE
SIGNAL TO CUT-OFF: 0.02 (ref <1.00)

## 2021-01-28 ENCOUNTER — Telehealth (INDEPENDENT_AMBULATORY_CARE_PROVIDER_SITE_OTHER): Payer: Managed Care, Other (non HMO) | Admitting: Family Medicine

## 2021-01-28 ENCOUNTER — Other Ambulatory Visit: Payer: Self-pay

## 2021-01-28 DIAGNOSIS — J029 Acute pharyngitis, unspecified: Secondary | ICD-10-CM

## 2021-01-28 DIAGNOSIS — J069 Acute upper respiratory infection, unspecified: Secondary | ICD-10-CM

## 2021-01-28 NOTE — Progress Notes (Signed)
Patient ID: Ariana Walsh, female   DOB: April 05, 1971, 50 y.o.   MRN: [id number]   This visit type was conducted due to national recommendations for restrictions regarding the COVID-19 pandemic in an effort to limit this patient's exposure and mitigate transmission in our community.   Virtual Visit via Video Note  I connected with Signe Colt on 01/28/21 at  2:30 PM EDT by a video enabled telemedicine application and verified that I am speaking with the correct person using two identifiers.  Location patient: home Location provider:work or home office Persons participating in the virtual visit: patient, provider  I discussed the limitations of evaluation and management by telemedicine and the availability of in person appointments. The patient expressed understanding and agreed to proceed.   HPI:  Ariana Walsh developed recent sore throat symptoms along with some chills body aches but no fever.  She states that she [place].  Her mother actually developed some URI symptoms and on Sunday was diagnosed with strep throat.  Patient actually went into urgent care type clinic this morning and rapid strep was negative.  Throat culture was sent off.  She did COVID test which came back negative.  She does have occasional cough and some mild nasal congestion.  Sore throat symptoms have actually improved some through the day.  No nausea or vomiting.  No dyspnea.   ROS: See pertinent positives and negatives per HPI.  Past Medical History:  Diagnosis Date   DJD (degenerative joint disease), lumbar 11/22/2019   Xray 11/2019   Exercise-induced asthma 09/21/2018   Allergy induced also   Hernia, umbilical    Osteoarthritis, hip, bilateral 11/22/2019   xrays 11/2019   Vitamin B12 deficiency 11/20/2019    Past Surgical History:  Procedure Laterality Date   DENTAL SURGERY     IVF     egg collection    Family History  Problem Relation Age of  Onset   Breast cancer Mother        post-menopausal, DCIS   Endometrial cancer Mother        mid-late 25's   Hypertension Mother    Hypertension Father    Diabetes Father    Heart disease Father        s/p 3V CABG age 62   Mental illness Father        depression   Arthritis Father        bilateral knee replacements   Cancer Father 27       unknown primary, metastatic   Mental illness Brother        depression   Healthy Son    Colon cancer Neg Hx     SOCIAL HX: Non-smoker   Current Outpatient Medications:    cetirizine (ZYRTEC) 10 MG tablet, Take 1 tablet (10 mg total) by mouth daily., Disp: 30 tablet, Rfl: 11   Eflornithine HCl (VANIQA) 13.9 % cream, Apply to area twice a day, Disp: 45 g, Rfl: 11   montelukast (SINGULAIR) 10 MG tablet, TAKE ONE TABLET AT BEDTIME., Disp: 30 tablet, Rfl: 3   Peak Flow Meter DEVI, Use as needed to check your breathing. Log your values, Disp: 1 each, Rfl: 0   PROAIR HFA 108 (90 Base) MCG/ACT inhaler, USE 2 PUFFS EVERY 4 HOURS AS NEEDED FOR COUGH OR SHORTNESS OF BREATH., Disp: 8.5 g, Rfl: 5  EXAM:  VITALS per patient if applicable:  GENERAL: alert, oriented, appears well and in no acute  distress  HEENT: atraumatic, conjunttiva clear, no obvious abnormalities on inspection of external nose and ears  NECK: normal movements of the head and neck  LUNGS: on inspection no signs of respiratory distress, breathing rate appears normal, no obvious gross SOB, gasping or wheezing  CV: no obvious cyanosis  MS: moves all visible extremities without noticeable abnormality  PSYCH/NEURO: pleasant and cooperative, no obvious depression or anxiety, speech and thought processing grossly intact  ASSESSMENT AND PLAN:  Discussed the following assessment and plan:  URI symptoms with sore throat.  This sounds to be likely viral given the fact that she does have some mild cough and nasal congestion.  COVID testing has been negative.  She has throat culture  pending.  At this point would recommend symptomatic treatment with salt water gargles, Tylenol or Advil, hot liquids, etc. She will follow-up with urgent care regarding her throat culture results which should be back in the next couple days     I discussed the assessment and treatment plan with the patient. The patient was provided an opportunity to ask questions and all were answered. The patient agreed with the plan and demonstrated an understanding of the instructions.   The patient was advised to call back or seek an in-person evaluation if the symptoms worsen or if the condition fails to improve as anticipated.     Evelena Peat, MD

## 2021-01-29 ENCOUNTER — Encounter: Payer: Self-pay | Admitting: Family Medicine

## 2021-02-01 NOTE — Telephone Encounter (Signed)
FYI for Dr. Maple Hudson:   Hey Dr. Maple Hudson!   Just to give you a heads up that my CPAP usage was only about an hour last night. I am suffering from a garden variety sore throat (negative for COVID, awaiting results from a throat culture) and once the air pressure kicked in last night, I was unable to rest comfortably. I will continue to attempt to use the CPAP each night, but there may be a lapse in my usage while my throat is sore.

## 2021-02-03 ENCOUNTER — Other Ambulatory Visit: Payer: Self-pay | Admitting: Family Medicine

## 2021-02-23 ENCOUNTER — Ambulatory Visit: Payer: Managed Care, Other (non HMO)

## 2021-03-02 ENCOUNTER — Telehealth: Payer: Self-pay

## 2021-03-02 ENCOUNTER — Ambulatory Visit: Payer: Managed Care, Other (non HMO)

## 2021-03-02 ENCOUNTER — Encounter (HOSPITAL_COMMUNITY): Payer: Self-pay | Admitting: Emergency Medicine

## 2021-03-02 ENCOUNTER — Other Ambulatory Visit: Payer: Self-pay

## 2021-03-02 ENCOUNTER — Ambulatory Visit (HOSPITAL_COMMUNITY)
Admission: EM | Admit: 2021-03-02 | Discharge: 2021-03-02 | Disposition: A | Discharge status: Home / Self Care | Payer: Managed Care, Other (non HMO) | Attending: Student | Admitting: Student

## 2021-03-02 DIAGNOSIS — R002 Palpitations: Secondary | ICD-10-CM | POA: Diagnosis not present

## 2021-03-02 DIAGNOSIS — U071 COVID-19: Secondary | ICD-10-CM

## 2021-03-02 DIAGNOSIS — Z8616 Personal history of COVID-19: Secondary | ICD-10-CM

## 2021-03-02 NOTE — Discharge Instructions (Addendum)
-  Your EKG looks okay today. -I cannot rule out an acute cardiac event in the urgent care setting.  To do this, you would need to go to the emergency department for labs and cardiac monitoring that we cannot do at urgent care.  If your symptoms return, especially if they are associated with chest pain, shortness of breath, dizziness, weakness-stop and call 911. -Call your primary care today to set up a follow-up appointment, you will likely need an outpatient cardiology work-up.

## 2021-03-02 NOTE — ED Triage Notes (Addendum)
Patient had covid and covid treatment (paxlovid), tested negative, then rebound covid.  This started 2 weeks ago.  Patient has had intermittent episodes of fast heart rate: reports heart rate bouncing around.    At one point, patient had taken decongestant while on paxlovid and was told to stop, patient did stop this  Patient reports heart rate was still rapid, fluttery, "just off".  Spoke to a triage nurse that recommended ucc.  On her way here a friend that works in internal medicine coached patient on valsalva maneuver.  Patient reports feeling better after this .  Continues to feel sore in chest.  But feels "fairly normal" now

## 2021-03-02 NOTE — ED Provider Notes (Signed)
[facility]    CSN: [id number] Arrival date & time: 03/02/21  1011      History   Chief Complaint Chief Complaint  Patient presents with   Tachycardia    HPI Ariana Walsh is a 50 y.o. female presenting with tachycardia. Medical history lumbar DJD, exercise induced asthma. States she was diagnosed with covid-19 on 02/12/21. Does have a history of asthma and so was started on Paxlovid same day. States her symptoms resolved quickly on this medication, and she finished it 02/17/21. Unfortunately her symptoms returned and she tested positive again few days later. States she initially experienced few episodes of transient palpitations on the paxlovid starting 8/5, but they've gotten progressively worse. Describes them as sensation of heart "jumping" in chest. They initially lasted few minutes and terminated on their own. Associated with lightheadedness, but no CP, SOB, headaches, vision changes. HR ranging 101 at home during episodes. States she had an episode last night that lasted 1.5 hours before finally terminating on its own. Another episode this morning terminated after she tried valsalva. Her friend/internist told her to go to urgent care. She denies history of cardiac ds in the past. Denies fevers/chills, n/v/d, shortness of breath, chest pain, cough, congestion, facial pain, teeth pain, headaches, sore throat, loss of taste/smell, swollen lymph nodes, ear pain.    HPI  Past Medical History:  Diagnosis Date   DJD (degenerative joint disease), lumbar 11/22/2019   Xray 11/2019   Exercise-induced asthma 09/21/2018   Allergy induced also   Hernia, umbilical    Osteoarthritis, hip, bilateral 11/22/2019   xrays 11/2019   Vitamin B12 deficiency 11/20/2019    Patient Active Problem List   Diagnosis Date Noted   Obesity (BMI 30-39.9) 01/26/2021   Keratosis pilaris 01/26/2021   Extrinsic asthma 06/02/2020   DJD (degenerative joint disease), lumbar 11/22/2019    Osteoarthritis, hip, bilateral 11/22/2019   Vitamin B12 deficiency 11/20/2019   Vegetarian 11/20/2019   OSA (obstructive sleep apnea) 10/04/2019   Exercise-induced asthma 09/21/2018   Perimenopausal symptoms 02/17/2018   Umbilical hernia 12/30/2016   Dystrophic nail 12/30/2016    Past Surgical History:  Procedure Laterality Date   DENTAL SURGERY     IVF     egg collection    OB History   No obstetric history on file.      Home Medications    Prior to Admission medications   Medication Sig Start Date End Date Taking? Authorizing Provider  montelukast (SINGULAIR) 10 MG tablet TAKE ONE TABLET AT BEDTIME. 02/04/21  Yes [name], MD  PROAIR HFA 108 [phone] Base) MCG/ACT inhaler USE 2 PUFFS EVERY 4 HOURS AS NEEDED FOR COUGH OR SHORTNESS OF BREATH. 05/28/20  Yes [name], MD  cetirizine (ZYRTEC) 10 MG tablet Take 1 tablet (10 mg total) by mouth daily. 06/02/20   [name], MD  Eflornithine HCl [facility]) 13.9 % cream Apply to area twice a day 01/26/21   [name], MD  Peak Flow Meter DEVI Use as needed to check your breathing. Log your values 06/02/20   [name], MD    Family History Family History  Problem Relation Age of Onset   Breast cancer Mother        post-menopausal, DCIS   Endometrial cancer Mother        mid-late 28's   Hypertension Mother    Hypertension Father    Diabetes Father    Heart disease Father  s/p 3V CABG age 81   Mental illness Father        depression   Arthritis Father        bilateral knee replacements   Cancer Father 61       unknown primary, metastatic   Mental illness Brother        depression   Healthy Son    Colon cancer Neg Hx     Social History Social History   Tobacco Use   Smoking status: Never   Smokeless tobacco: Never  Vaping Use   Vaping Use: Never used  Substance Use Topics   Alcohol use: Not Currently    Alcohol/week: 0.0 - 3.0 standard drinks   Drug use: No     Allergies    Latex   Review of Systems Review of Systems  Cardiovascular:  Positive for palpitations.  All other systems reviewed and are negative.   Physical Exam Triage Vital Signs ED Triage Vitals  Enc Vitals Group     BP 03/02/21 1143 (!) 148/77     Pulse Rate 03/02/21 1143 68     Resp 03/02/21 1143 20     Temp 03/02/21 1143 98.1 F (36.7 C)     Temp Source 03/02/21 1143 Oral     SpO2 03/02/21 1143 98 %     Weight --      Height --      Head Circumference --      Peak Flow --      Pain Score 03/02/21 1137 2     Pain Loc --      Pain Edu? --      Excl. in GC? --    No data found.  Updated Vital Signs BP (!) 148/77   Pulse 68   Temp 98.1 F (36.7 C) (Oral)   Resp 20   SpO2 98%   Visual Acuity Right Eye Distance:   Left Eye Distance:   Bilateral Distance:    Right Eye Near:   Left Eye Near:    Bilateral Near:     Physical Exam Vitals reviewed.  Constitutional:      General: She is not in acute distress.    Appearance: Normal appearance. She is not ill-appearing or diaphoretic.  HENT:     Head: Normocephalic and atraumatic.     Right Ear: Tympanic membrane, ear canal and external ear normal. No tenderness. No middle ear effusion. There is no impacted cerumen. Tympanic membrane is not perforated, erythematous, retracted or bulging.     Left Ear: Tympanic membrane, ear canal and external ear normal. No tenderness.  No middle ear effusion. There is no impacted cerumen. Tympanic membrane is not perforated, erythematous, retracted or bulging.     Nose: Nose normal. No congestion.     Mouth/Throat:     Mouth: Mucous membranes are moist.     Pharynx: Uvula midline. No oropharyngeal exudate or posterior oropharyngeal erythema.  Eyes:     Extraocular Movements: Extraocular movements intact.     Pupils: Pupils are equal, round, and reactive to light.  Cardiovascular:     Rate and Rhythm: Normal rate and regular rhythm.     Pulses:          Radial pulses are 2+ on the  right side and 2+ on the left side.     Heart sounds: Normal heart sounds.  Pulmonary:     Effort: Pulmonary effort is normal.     Breath sounds: Normal breath sounds. No  decreased breath sounds, wheezing, rhonchi or rales.  Abdominal:     Palpations: Abdomen is soft.     Tenderness: There is no abdominal tenderness. There is no guarding or rebound.  Musculoskeletal:     Right lower leg: No edema.     Left lower leg: No edema.  Skin:    General: Skin is warm.     Capillary Refill: Capillary refill takes less than 2 seconds.  Neurological:     General: No focal deficit present.     Mental Status: She is alert and oriented to person, place, and time.  Psychiatric:        Mood and Affect: Mood normal.        Behavior: Behavior normal.        Thought Content: Thought content normal.        Judgment: Judgment normal.     UC Treatments / Results  Labs (all labs ordered are listed, but only abnormal results are displayed) Labs Reviewed - No data to display  EKG   Radiology No results found.  Procedures Procedures (including critical care time)  Medications Ordered in UC Medications - No data to display  Initial Impression / Assessment and Plan / UC Course  I have reviewed the triage vital signs and the nursing notes.  Pertinent labs & imaging results that were available during my care of the patient were reviewed by me and considered in my medical decision making (see chart for details).     This patient is a very pleasant 50 y.o. year old female presenting with palpitations since COVID-19 diagnosis, getting worse. EKG today NSR, no prior EKG for comparison. Benign exam, radial pulses equal and regular. Rec close f/u with PCP with possible outpatient cardiology workup. Head straight to ED or call 911 if symptoms worsen/persist. Patient verbalizes understanding and agreement multiple times.   Final Clinical Impressions(s) / UC Diagnoses   Final diagnoses:  COVID-19   Palpitations     Discharge Instructions      -Your EKG looks okay today. -I cannot rule out an acute cardiac event in the urgent care setting.  To do this, you would need to go to the emergency department for labs and cardiac monitoring that we cannot do at urgent care.  If your symptoms return, especially if they are associated with chest pain, shortness of breath, dizziness, weakness-stop and call 911. -Call your primary care today to set up a follow-up appointment, you will likely need an outpatient cardiology work-up.     ED Prescriptions   None    PDMP not reviewed this encounter.   Rhys Martini, PA-C 03/02/21 1250
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
## 2021-03-03 ENCOUNTER — Ambulatory Visit (INDEPENDENT_AMBULATORY_CARE_PROVIDER_SITE_OTHER): Payer: Managed Care, Other (non HMO)

## 2021-03-03 ENCOUNTER — Ambulatory Visit: Payer: Managed Care, Other (non HMO) | Admitting: Family Medicine

## 2021-03-03 ENCOUNTER — Other Ambulatory Visit: Payer: Self-pay | Admitting: Family Medicine

## 2021-03-03 ENCOUNTER — Encounter: Payer: Self-pay | Admitting: Family Medicine

## 2021-03-03 ENCOUNTER — Other Ambulatory Visit: Payer: Self-pay

## 2021-03-03 VITALS — BP 120/80 | HR 60 | Temp 98.1°F | Wt 181.8 lb

## 2021-03-03 DIAGNOSIS — Z8616 Personal history of COVID-19: Secondary | ICD-10-CM

## 2021-03-03 DIAGNOSIS — R002 Palpitations: Secondary | ICD-10-CM | POA: Diagnosis not present

## 2021-03-03 LAB — CBC WITH DIFFERENTIAL/PLATELET
Basophils Absolute: 0 10*3/uL (ref 0.0–0.1)
Basophils Relative: 0.9 % (ref 0.0–3.0)
Eosinophils Absolute: 0.1 10*3/uL (ref 0.0–0.7)
Eosinophils Relative: 2.7 % (ref 0.0–5.0)
HCT: 38.5 % (ref 36.0–46.0)
Hemoglobin: 13.3 g/dL (ref 12.0–15.0)
Lymphocytes Relative: 38.4 % (ref 12.0–46.0)
Lymphs Abs: 1.3 10*3/uL (ref 0.7–4.0)
MCHC: 34.6 g/dL (ref 30.0–36.0)
MCV: 94.2 fl (ref 78.0–100.0)
Monocytes Absolute: 0.2 10*3/uL (ref 0.1–1.0)
Monocytes Relative: 5.4 % (ref 3.0–12.0)
Neutro Abs: 1.8 10*3/uL (ref 1.4–7.7)
Neutrophils Relative %: 52.6 % (ref 43.0–77.0)
Platelets: 248 10*3/uL (ref 150.0–400.0)
RBC: 4.09 Mil/uL (ref 3.87–5.11)
RDW: 11.9 % (ref 11.5–15.5)
WBC: 3.5 10*3/uL — ABNORMAL LOW (ref 4.0–10.5)

## 2021-03-03 LAB — BASIC METABOLIC PANEL
BUN: 12 mg/dL (ref 6–23)
CO2: 27 mEq/L (ref 19–32)
Calcium: 9.7 mg/dL (ref 8.4–10.5)
Chloride: 105 mEq/L (ref 96–112)
Creatinine, Ser: 0.76 mg/dL (ref 0.40–1.20)
GFR: 90.78 mL/min (ref >60.00)
Glucose, Bld: 75 mg/dL (ref 70–99)
Potassium: 4 mEq/L (ref 3.5–5.1)
Sodium: 140 mEq/L (ref 135–145)

## 2021-03-03 LAB — TSH: TSH: 0.86 u[IU]/mL (ref 0.35–5.50)

## 2021-03-03 NOTE — Progress Notes (Unsigned)
Patient enrolled for [employer] to mail a 2-3 day ZIO XT monitor to her address on file. 

## 2021-03-03 NOTE — Progress Notes (Signed)
Subjective  CC:  Chief Complaint  Patient presents with   Palpitations    Seen at ED yesterday, EKG was normal - but could not rule out a cardiac event, suggest a referral to cardiology. Mentioned this might be ongoing issues post COVID     HPI: Ariana Walsh is a 50 y.o. female who presents to the office today to address the problems listed above in the chief complaint. 50 year old female presents to follow-up after being seen in the emergency room yesterday due to heart racing.  I reviewed ER notes, lab results, EKG results which were normal.  Patient reports that she had 2-3 separate episodes of feeling heart palpitations and a racing heart.  Using her iWatch, she noted her heart rate to be erratic but it was not irregular as far she can tell.  She denies any symptoms of chest pain, shortness of breath, lightheadedness, nausea or diaphoresis.  Her symptoms were not exertional in nature.  She was diagnosed with COVID on August 5.  She was treated with Paxil bid.  Her first episode of fast heart rate was noted while she was ill although her symptoms overall were very mild.  Unfortunately, 5 to 7 days later she started to have a resurgence of COVID symptoms and tested positive again.  She had a second bout of tachycardia then.  This lasted for several hours.  It was improved with Valsalva maneuver.  She has no history of heart disease she does have mild intermittent asthma but says this has not been affected.  Mild fatigue.  No fevers or cough.  Patient did note that she had several drinks with caffeine the day the palpitations.  Assessment  1. Palpitations   2. Personal history of COVID-19      Plan  Palpitations: Most likely these are related to her recent COVID infection.  Symptoms do not sound to be ischemic or necessarily a pathologic arrhythmia.  Recommend avoid caffeine, limit stressors, limited exertion until she has fully recovered from COVID, check 3-day Holter monitor and  lab work.  Patient to follow-up if symptoms persist, worsen, or associated with lightheadedness shortness of breath or chest pain.  If symptoms worsen she would return to the emergency room.  Follow up: As scheduled. 07/29/2021  Orders Placed This Encounter  Procedures   CBC with Differential/Platelet   Basic metabolic panel   TSH   Holter monitor - 48 hour   No orders of the defined types were placed in this encounter.     I reviewed the patients updated PMH, FH, and SocHx.    Patient Active Problem List   Diagnosis Date Noted   Obesity (BMI 30-39.9) 01/26/2021   Keratosis pilaris 01/26/2021   Extrinsic asthma 06/02/2020   DJD (degenerative joint disease), lumbar 11/22/2019   Osteoarthritis, hip, bilateral 11/22/2019   Vitamin B12 deficiency 11/20/2019   Vegetarian 11/20/2019   OSA (obstructive sleep apnea) 10/04/2019   Exercise-induced asthma 09/21/2018   Perimenopausal symptoms 02/17/2018   Umbilical hernia 12/30/2016   Dystrophic nail 12/30/2016   Current Meds  Medication Sig   cetirizine (ZYRTEC) 10 MG tablet Take 1 tablet (10 mg total) by mouth daily.   Eflornithine HCl (VANIQA) 13.9 % cream Apply to area twice a day   montelukast (SINGULAIR) 10 MG tablet TAKE ONE TABLET AT BEDTIME.   Peak Flow Meter DEVI Use as needed to check your breathing. Log your values   PROAIR HFA 108 (90 Base) MCG/ACT inhaler USE 2 PUFFS EVERY  4 HOURS AS NEEDED FOR COUGH OR SHORTNESS OF BREATH.    Allergies: Patient is allergic to latex. Family History: Patient family history includes Arthritis in her father; Breast cancer in her mother; Cancer (age of onset: 75) in her father; Diabetes in her father; Endometrial cancer in her mother; Healthy in her son; Heart disease in her father; Hypertension in her father and mother; Mental illness in her brother and father. Social History:  Patient  reports that she has never smoked. She has never used smokeless tobacco. She reports that she does not  currently use alcohol. She reports that she does not use drugs.  Review of Systems: Constitutional: Negative for fever malaise or anorexia Cardiovascular: negative for chest pain Respiratory: negative for SOB or persistent cough Gastrointestinal: negative for abdominal pain  Objective  Vitals: BP 120/80   Pulse 60   Temp 98.1 F (36.7 C) (Temporal)   Wt 181 lb 12.8 oz (82.5 kg)   SpO2 97%   BMI 29.34 kg/m  General: no acute distress , A&Ox3 HEENT: PEERL, conjunctiva normal, neck is supple Cardiovascular:  RRR without murmur or gallop. No edema Neuro: no tremor Respiratory:  Good breath sounds bilaterally, CTAB with normal respiratory effort Skin:  Warm, no rashes  EKG reviewed from ER: NSR. Nl ekg Commons side effects, risks, benefits, and alternatives for medications and treatment plan prescribed today were discussed, and the patient expressed understanding of the given instructions. Patient is instructed to call or message via MyChart if he/she has any questions or concerns regarding our treatment plan. No barriers to understanding were identified. We discussed Red Flag symptoms and signs in detail. Patient expressed understanding regarding what to do in case of urgent or emergency type symptoms.  Medication list was reconciled, printed and provided to the patient in AVS. Patient instructions and summary information was reviewed with the patient as documented in the AVS. This note was prepared with assistance of Dragon voice recognition software. Occasional wrong-word or sound-a-like substitutions may have occurred due to the inherent limitations of voice recognition software  This visit occurred during the SARS-CoV-2 public health emergency.  Safety protocols were in place, including screening questions prior to the visit, additional usage of staff PPE, and extensive cleaning of exam room while observing appropriate contact time as indicated for disinfecting solutions.

## 2021-03-03 NOTE — Patient Instructions (Signed)
Please follow up if symptoms do not improve or as needed.    I will release your lab results to you on your MyChart account with further instructions. Please reply with any questions.    We will call you to get you set up for a 48 hour heart monitor.   Palpitations Palpitations are feelings that your heartbeat is irregular or is faster than normal. It may feel like your heart is fluttering or skipping a beat. Palpitations are usually not a serious problem. They may be caused by many things, including smoking, caffeine, alcohol, stress, and certain medicines or drugs. Most causes of palpitations are not serious. However, some palpitations can be a sign of a serious problem. You may need further tests to rule outserious medical problems. Follow these instructions at home:     Pay attention to any changes in your condition. Take these actions to helpmanage your symptoms: Eating and drinking Avoid foods and drinks that may cause palpitations. These may include: Caffeinated coffee, tea, soft drinks, diet pills, and energy drinks. Chocolate. Alcohol. Lifestyle Take steps to reduce your stress and anxiety. Things that can help you relax include: Yoga. Mind-body activities, such as deep breathing, meditation, or using words and images to create positive thoughts (guided imagery). Physical activity, such as swimming, jogging, or walking. Tell your health care provider if your palpitations increase with activity. If you have chest pain or shortness of breath with activity, do not continue the activity until you are seen by your health care provider. Biofeedback. This is a method that helps you learn to use your mind to control things in your body, such as your heartbeat. Do not use drugs, including cocaine or ecstasy. Do not use marijuana. Get plenty of rest and sleep. Keep a regular bed time. General instructions Take over-the-counter and prescription medicines only as told by your health care  provider. Do not use any products that contain nicotine or tobacco, such as cigarettes and e-cigarettes. If you need help quitting, ask your health care provider. Keep all follow-up visits as told by your health care provider. This is important. These may include visits for further testing if palpitations do not go away or get worse. Contact a health care provider if you: Continue to have a fast or irregular heartbeat after 24 hours. Notice that your palpitations occur more often. Get help right away if you: Have chest pain or shortness of breath. Have a severe headache. Feel dizzy or you faint. Summary Palpitations are feelings that your heartbeat is irregular or is faster than normal. It may feel like your heart is fluttering or skipping a beat. Palpitations may be caused by many things, including smoking, caffeine, alcohol, stress, certain medicines, and drugs. Although most causes of palpitations are not serious, some causes can be a sign of a serious medical problem. Get help right away if you faint or have chest pain, shortness of breath, a severe headache, or dizziness. This information is not intended to replace advice given to you by your health care provider. Make sure you discuss any questions you have with your healthcare provider. Document Revised: 08/10/2017 Document Reviewed: 08/10/2017 Elsevier Patient Education  2022 [employer].

## 2021-03-03 NOTE — Telephone Encounter (Signed)
FYI, appt today 

## 2021-03-07 DIAGNOSIS — R002 Palpitations: Secondary | ICD-10-CM | POA: Diagnosis not present

## 2021-03-07 DIAGNOSIS — Z8616 Personal history of COVID-19: Secondary | ICD-10-CM

## 2021-03-09 ENCOUNTER — Other Ambulatory Visit: Payer: Self-pay

## 2021-03-09 ENCOUNTER — Ambulatory Visit
Admission: RE | Admit: 2021-03-09 | Discharge: 2021-03-09 | Disposition: A | Discharge status: Home / Self Care | Payer: Managed Care, Other (non HMO) | Source: Ambulatory Visit | Attending: Family Medicine | Admitting: Family Medicine

## 2021-03-09 DIAGNOSIS — Z1231 Encounter for screening mammogram for malignant neoplasm of breast: Secondary | ICD-10-CM

## 2021-03-21 ENCOUNTER — Encounter: Payer: Self-pay | Admitting: Family Medicine

## 2021-03-23 ENCOUNTER — Telehealth: Payer: Self-pay

## 2021-03-23 NOTE — Telephone Encounter (Signed)
She had a heart monitor done: it was in august. Can you please find the results?

## 2021-03-23 NOTE — Telephone Encounter (Signed)
Spoke with patient regarding Zio heart monitor results, let her know that once we receive them I will reach back out to her

## 2021-03-24 ENCOUNTER — Telehealth: Payer: Self-pay

## 2021-03-24 NOTE — Telephone Encounter (Signed)
Referral notes received from [facility], Phone #: [phone], Fax #: [phone]   A copy of the notes have been placed in the scheduling box for check-out to pick-up and to enter referral. Original notes placed in file cabinet.   

## 2021-03-25 ENCOUNTER — Encounter: Payer: Self-pay | Admitting: Family Medicine

## 2021-04-14 ENCOUNTER — Other Ambulatory Visit: Payer: Self-pay

## 2021-04-14 ENCOUNTER — Ambulatory Visit
Admission: RE | Admit: 2021-04-14 | Discharge: 2021-04-14 | Disposition: A | Discharge status: Home / Self Care | Payer: Managed Care, Other (non HMO) | Source: Ambulatory Visit | Attending: Family Medicine | Admitting: Family Medicine

## 2021-04-14 IMAGING — MG MM DIGITAL SCREENING BILAT W/ TOMO AND CAD
8 series · 8 of 24 positions shown · non-contrast
Comparison: Previous exam(s).

CLINICAL DATA: Screening.

EXAM:
DIGITAL SCREENING BILATERAL MAMMOGRAM WITH TOMOSYNTHESIS AND CAD
TECHNIQUE: Bilateral screening digital craniocaudal and mediolateral oblique
mammograms were obtained. Bilateral screening digital breast
tomosynthesis was performed. The images were evaluated with
computer-aided detection.

[L MLO synth-2D]
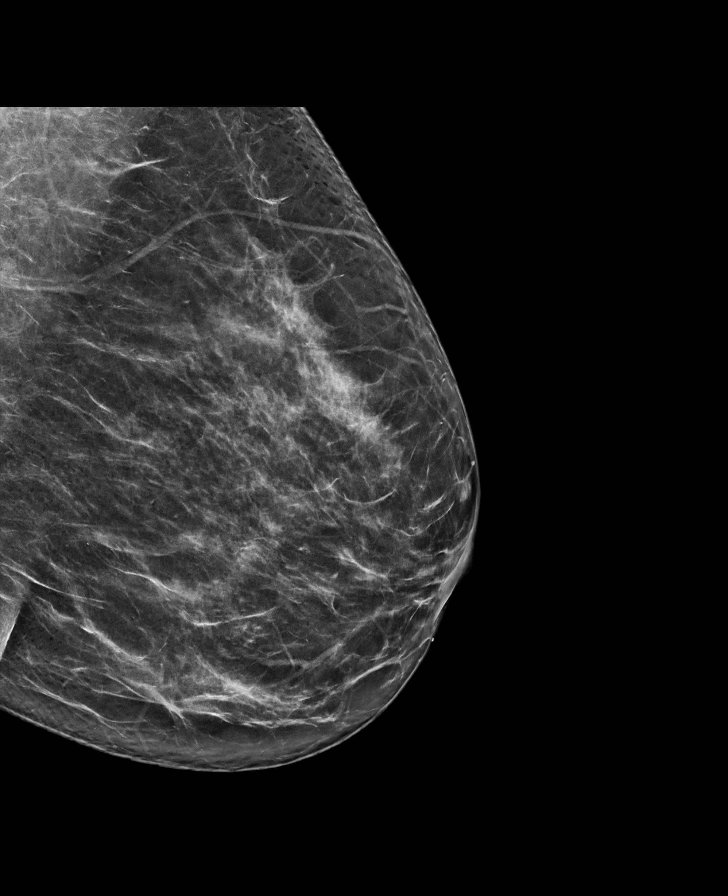

[R CC synth-2D]
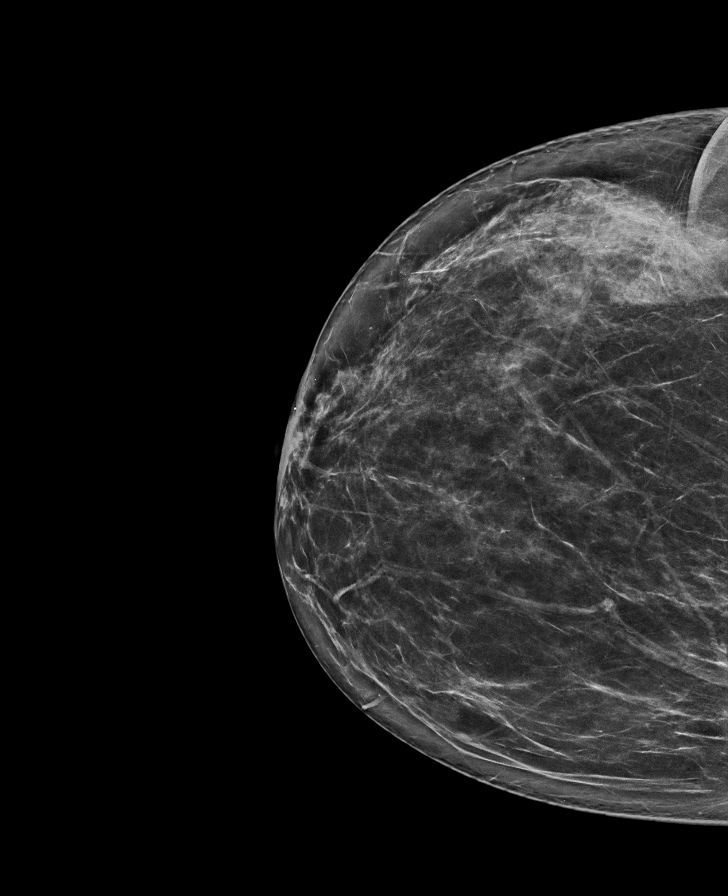

[R MLO synth-2D]
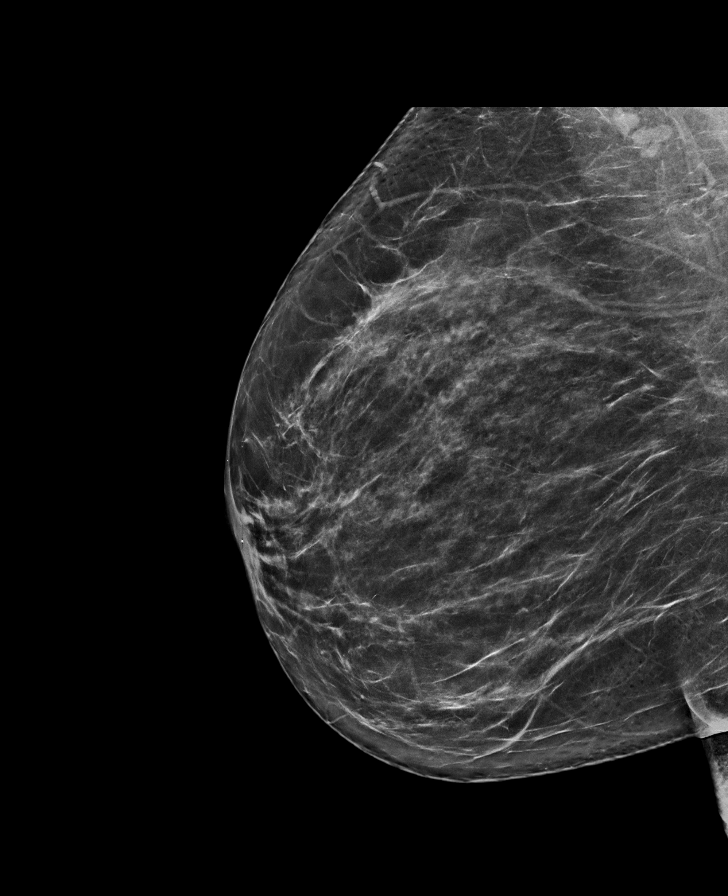

[L CC synth-2D]
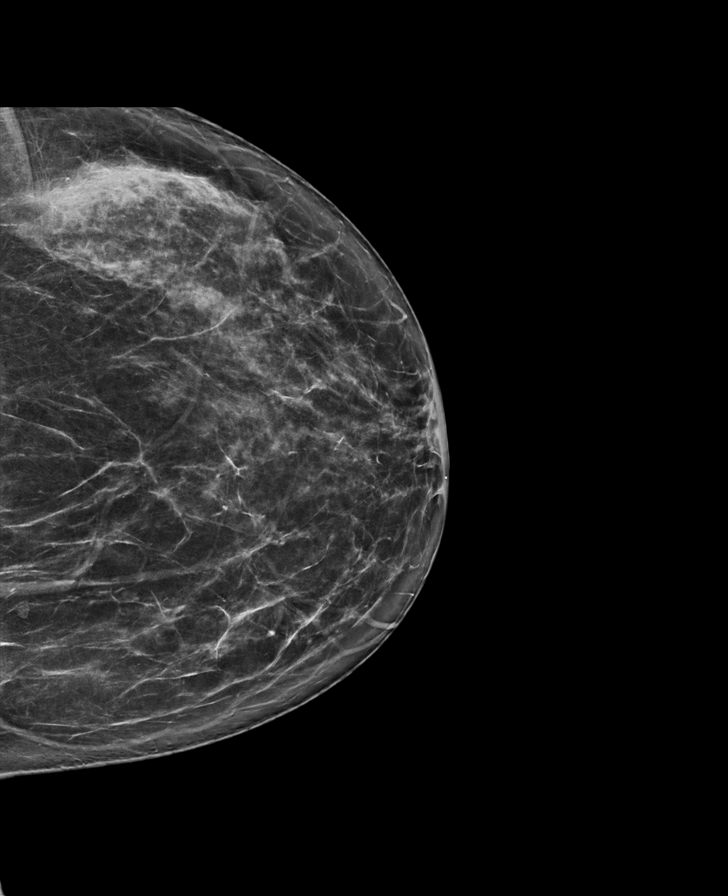

[L MLO tomo · tomo slice 41/80.0]
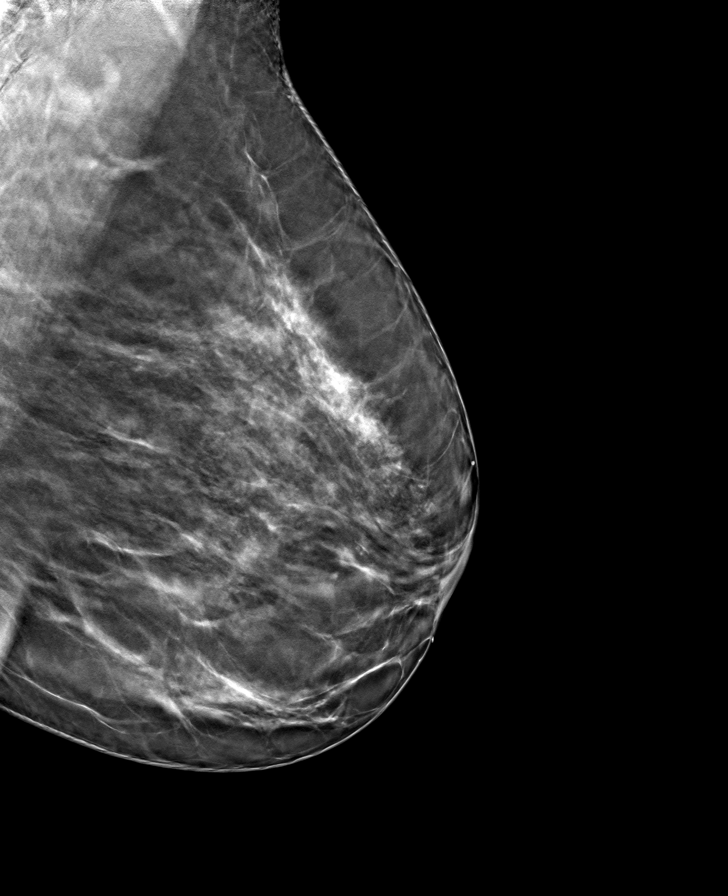

[R CC tomo · tomo slice 41/82.0]
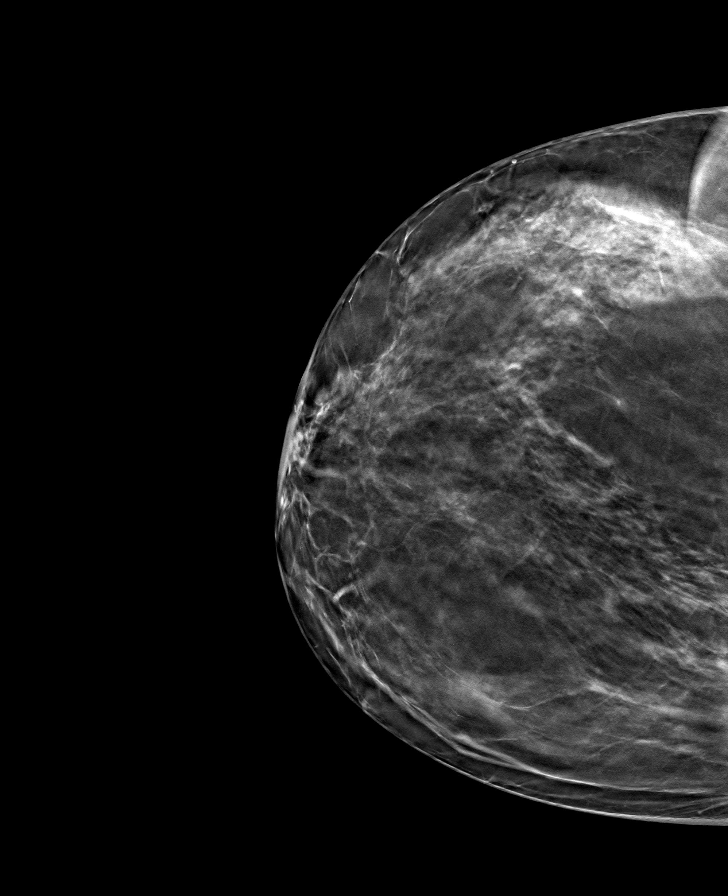

[L CC tomo · tomo slice 39/78.0]
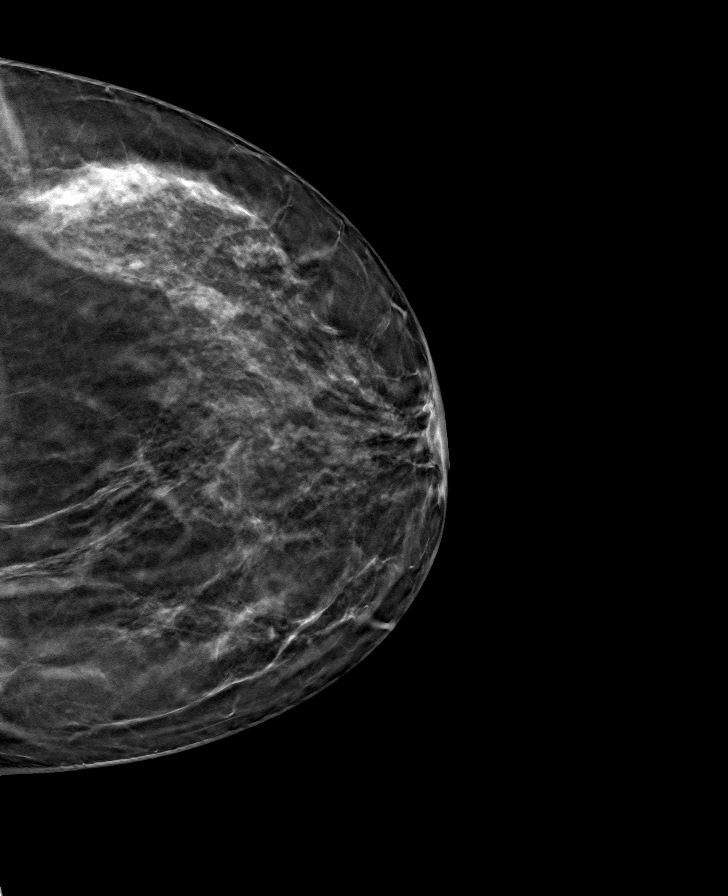

[R MLO tomo · tomo slice 42/83.0]
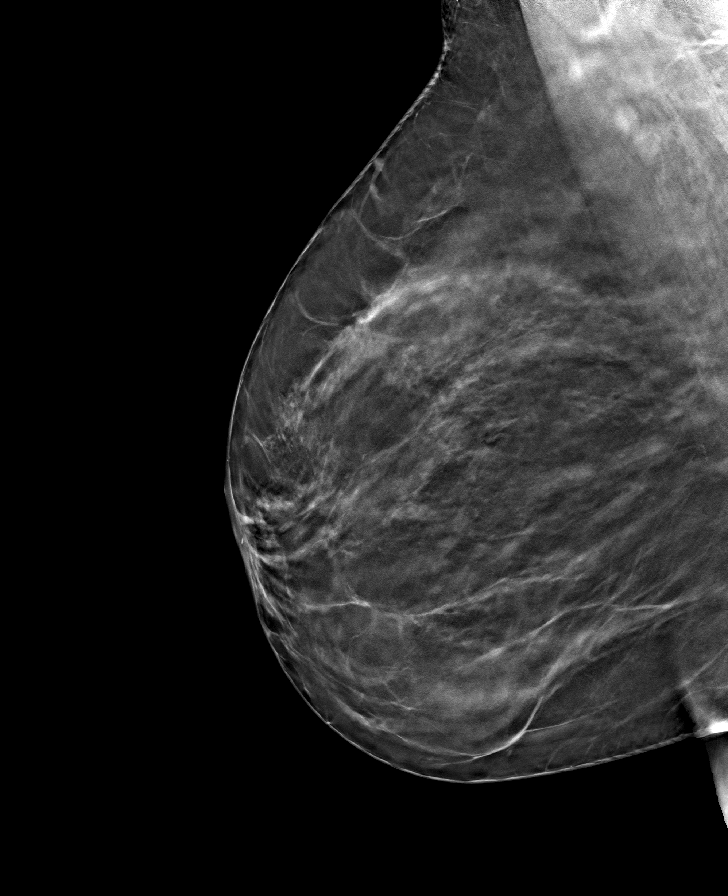

[8 of 24 positions shown; findings below may reference images not displayed]

ACR Breast Density Category c: The breast tissue is heterogeneously
dense, which may obscure small masses.
FINDINGS: There are no findings suspicious for malignancy.
IMPRESSION: No mammographic evidence of malignancy. A result letter of this
screening mammogram will be mailed directly to the patient.

RECOMMENDATION:
Screening mammogram in one year. (Code:[V2])

BI-RADS CATEGORY  1: Negative.

## 2021-04-28 NOTE — Assessment & Plan Note (Signed)
Benefits from CPAP with good compliance and control Plan- continue auto 5-15 

## 2021-04-28 NOTE — Assessment & Plan Note (Signed)
No exacerbation ?

## 2021-05-25 ENCOUNTER — Other Ambulatory Visit: Payer: Self-pay

## 2021-05-25 ENCOUNTER — Encounter: Payer: Self-pay | Admitting: Internal Medicine

## 2021-05-25 ENCOUNTER — Encounter (HOSPITAL_BASED_OUTPATIENT_CLINIC_OR_DEPARTMENT_OTHER): Payer: Self-pay | Admitting: Cardiology

## 2021-05-25 ENCOUNTER — Ambulatory Visit (HOSPITAL_BASED_OUTPATIENT_CLINIC_OR_DEPARTMENT_OTHER): Payer: Managed Care, Other (non HMO) | Admitting: Cardiology

## 2021-05-25 VITALS — BP 140/86 | HR 56 | Ht 66.0 in | Wt 169.4 lb

## 2021-05-25 DIAGNOSIS — Z7189 Other specified counseling: Secondary | ICD-10-CM

## 2021-05-25 DIAGNOSIS — Z01810 Encounter for preprocedural cardiovascular examination: Secondary | ICD-10-CM | POA: Diagnosis not present

## 2021-05-25 DIAGNOSIS — R002 Palpitations: Secondary | ICD-10-CM | POA: Diagnosis not present

## 2021-05-25 DIAGNOSIS — Z8616 Personal history of COVID-19: Secondary | ICD-10-CM | POA: Diagnosis not present

## 2021-05-25 DIAGNOSIS — Z01818 Encounter for other preprocedural examination: Secondary | ICD-10-CM

## 2021-05-25 NOTE — Progress Notes (Signed)
Cardiology Office Note:    Date:  05/27/2021   ID:  Ariana Walsh, DOB 03/05/1971, MRN [id number]  PCP:  [name], MD  Cardiologist:  [name], MD  Referring MD: [name], MD   CC: new patient consult for palpitations and preoperative evaluation for hernia repair  History of Present Illness:    Ariana Walsh is a 50 y.o. female with a hx of asthma, osteoarthritis, and DJD (lumbar), who is seen as a new consult at the request of [name], MD for the evaluation and management of palpitations and pre-op clearance for hernia repair.  Planned surgery: Incarcerated ventral hernia repair on 06/12/2021.  Pertinent past cardiac history:  Prior cardiac workup: Monitor 02/2021-03/2021 History of valve disease: None History of CAD/PAD/CVA/TIA: None History of heart failure: None History of arrhythmia: Overall, she is feeling okay with no recent palpitations. She presented to the ED 03/02/2021 with tachycardia. At that time her palpitations had widely varying durations and occurred several times a day. Since then her palpitations slowly resolved. On anticoagulation: No History of hypertension: Since 11/2020, she has lost quite a bit of weight. Lately, her blood pressure has been increasing to the 140's systolic despite losing weight.  History of diabetes: None History of CKD: None History of OSA: Yes, currently on CPAP. History of anesthesia complications: No, but her grandmother died due to this type of complication, and this is concerning for her. Current symptoms:  No recent palpitations. She endorses dizziness with frequent standing after she has been squatting. Functional capacity: She has felt asymptomatic even with her elevated heart rate after leaf blowing and other yard work. She works as a Engineer, water and participates in productions. Recently, she also went hiking with no significant exertional issues. Diet: For her diet she has been  vegetarian since 1997. Family History: Father had CABG x4 in his 61s. "Everyone in her family" has CAD, heart attack, or stroke.  She denies any chest pain, or shortness of breath. No headaches, syncope, orthopnea, PND, or lower extremity edema.  Of note, she was diagnosed with Covid on 02/12/2021.  Past Medical History:  Diagnosis Date   DJD (degenerative joint disease), lumbar 11/22/2019   Xray 11/2019   Exercise-induced asthma 09/21/2018   Allergy induced also   Hernia, umbilical    Osteoarthritis, hip, bilateral 11/22/2019   xrays 11/2019   Vitamin B12 deficiency 11/20/2019    Past Surgical History:  Procedure Laterality Date   DENTAL SURGERY     IVF     egg collection    Current Medications: Current Outpatient Medications on File Prior to Visit  Medication Sig   cetirizine (ZYRTEC) 10 MG tablet Take 1 tablet (10 mg total) by mouth daily.   montelukast (SINGULAIR) 10 MG tablet TAKE ONE TABLET AT BEDTIME.   Peak Flow Meter DEVI Use as needed to check your breathing. Log your values   PROAIR HFA 108 (90 Base) MCG/ACT inhaler USE 2 PUFFS EVERY 4 HOURS AS NEEDED FOR COUGH OR SHORTNESS OF BREATH.   Eflornithine HCl (VANIQA) 13.9 % cream Apply to area twice a day (Patient not taking: Reported on 05/25/2021)   No current facility-administered medications on file prior to visit.     Allergies:   Latex   Social History   Tobacco Use   Smoking status: Never   Smokeless tobacco: Never  Vaping Use   Vaping Use: Never used  Substance Use Topics   Alcohol use: Not Currently    Alcohol/week:  0.0 - 3.0 standard drinks   Drug use: No    Family History: family history includes Arthritis in her father; Breast cancer in her cousin and mother; Cancer (age of onset: 36) in her father; Diabetes in her father; Endometrial cancer in her mother; Healthy in her son; Heart disease in her father; Hypertension in her father and mother; Mental illness in her brother and father. There is no history  of Colon cancer.  ROS:   Please see the history of present illness.  Additional pertinent ROS: Constitutional: Negative for chills, fever, night sweats, unintentional weight loss  HENT: Negative for ear pain and hearing loss.   Eyes: Negative for loss of vision and eye pain.  Respiratory: Negative for cough, sputum, wheezing.   Cardiovascular: See HPI. Gastrointestinal: Negative for abdominal pain, melena, and hematochezia.  Genitourinary: Negative for dysuria and hematuria.  Musculoskeletal: Negative for falls and myalgias.  Skin: Negative for itching and rash.  Neurological: Negative for focal weakness, focal sensory changes and loss of consciousness.  Endo/Heme/Allergies: Does not bruise/bleed easily.     EKGs/Labs/Other Studies Reviewed:    The following studies were reviewed today:  Monitor 02/2021-03/2021: Patch Wear Time:  3 days and 0 hours    Predominant rhythm was sinus rhythm <1% ventricular and supraventricular ectopy No atrial fibrillation noted 9 SVT episodes, all less than 10 beats Symptoms/triggered events associated with sinus rhythm  EKG:  EKG is personally reviewed.   05/25/2021: sinus bradycardia at 56 bpm  Recent Labs: 01/26/2021: ALT 22 03/03/2021: BUN 12; Creatinine, Ser 0.76; Hemoglobin 13.3; Platelets 248.0; Potassium 4.0; Sodium 140; TSH 0.86   Recent Lipid Panel    Component Value Date/Time   CHOL 154 01/26/2021 1401   TRIG 156.0 (H) 01/26/2021 1401   HDL 44.10 01/26/2021 1401   CHOLHDL 3 01/26/2021 1401   VLDL 31.2 01/26/2021 1401   LDLCALC 79 01/26/2021 1401    Physical Exam:    VS:  BP 140/86   Pulse (!) 56   Ht 5\' 6"  (1.676 m)   Wt 169 lb 6.4 oz (76.8 kg)   SpO2 98%   BMI 27.34 kg/m     Wt Readings from Last 3 Encounters:  05/25/21 169 lb 6.4 oz (76.8 kg)  03/03/21 181 lb 12.8 oz (82.5 kg)  01/26/21 194 lb 3.2 oz (88.1 kg)    GEN: Well nourished, well developed in no acute distress HEENT: Normal, moist mucous membranes NECK:  No JVD CARDIAC: regular rhythm, normal S1 and S2, no rubs or gallops. No murmur. VASCULAR: Radial and DP pulses 2+ bilaterally. No carotid bruits RESPIRATORY:  Clear to auscultation without rales, wheezing or rhonchi  ABDOMEN: Soft, non-tender, non-distended MUSCULOSKELETAL:  Ambulates independently SKIN: Warm and dry, no edema NEUROLOGIC:  Alert and oriented x 3. No focal neuro deficits noted. PSYCHIATRIC:  Normal affect    ASSESSMENT:    1. Pre-op evaluation   2. Palpitations   3. Personal history of COVID-19   4. Cardiac risk counseling    PLAN:    Preoperative cardiovascular exam Based on available date, patient's RCRI score = 0, which carries a 3.9% 30-day risk of death, MI, or cardiac arrest.  The patient is not currently having active cardiac symptoms, and they can achieve >4 METs of activity.  According to ACC/AHA Guidelines, no further testing is needed.  Proceed with surgery at acceptable risk.  Our service is available as needed in the peri-operative period.     Palpitations, history of Covid infection -palpitations  have now resolved.  -if they recur, discussed Kardia Mobile vs. Monitor. She will contact us -no high risk features previously  Cardiac risk counseling and prevention recommendations: -recommend heart healthy/Mediterranean diet, with whole grains, fruits, vegetable, fish, lean meats, nuts, and olive oil. Limit salt. -recommend moderate walking, 3-5 times/week for 30-50 minutes each session. Aim for at least 150 minutes.week. Goal should be pace of 3 miles/hours, or walking 1.5 miles in 30 minutes -recommend avoidance of tobacco products. Avoid excess alcohol. -ASCVD risk score: The 10-year ASCVD risk score (Arnett DK, et al., 2019) is: 1.5%   Values used to calculate the score:     Age: 60 years     Sex: Female     Is Non-Hispanic African American: No     Diabetic: No     Tobacco smoker: No     Systolic Blood Pressure: [id number] mmHg     Is BP treated:  No     HDL Cholesterol: 44.1 mg/dL     Total Cholesterol: 154 mg/dL    Plan for follow up: 6 months or sooner as needed.  Buford Dresser, MD, PhD, [facility]    Medication Adjustments/Labs and Tests Ordered: Current medicines are reviewed at length with the patient today.  Concerns regarding medicines are outlined above.   Orders Placed This Encounter  Procedures   EKG 12-Lead    No orders of the defined types were placed in this encounter.  Patient Instructions  Medication Instructions:  Your Physician recommend you continue on your current medication as directed.    *If you need a refill on your cardiac medications before your next appointment, please call your pharmacy*   Lab Work: None ordered today   Testing/Procedures: None ordered today   Follow-Up: At [facility], you and your health needs are our priority.  As part of our continuing mission to provide you with exceptional heart care, we have created designated Provider Care Teams.  These Care Teams include your primary Cardiologist (physician) and Advanced Practice Providers (APPs -  Physician Assistants and Nurse Practitioners) who all work together to provide you with the care you need, when you need it.  We recommend signing up for the patient portal called "MyChart".  Sign up information is provided on this After Visit Summary.  MyChart is used to connect with patients for Virtual Visits (Telemedicine).  Patients are able to view lab/test results, encounter notes, upcoming appointments, etc.  Non-urgent messages can be sent to your provider as well.   To learn more about what you can do with MyChart, go to [email or web address].    Your next appointment:   6 month(s)  The format for your next appointment:   In Person  Provider:   Buford Dresser, MD     [facility],acting as a scribe for Buford Dresser, MD.,have documented all relevant documentation on  the behalf of Buford Dresser, MD,as directed by  Buford Dresser, MD while in the presence of Buford Dresser, MD.  I, Buford Dresser, MD, have reviewed all documentation for this visit. The documentation on 05/27/21 for the exam, diagnosis, procedures, and orders are all accurate and complete.   Signed, Buford Dresser, MD PhD 05/27/2021 9:06 AM    [place]

## 2021-05-25 NOTE — Patient Instructions (Signed)

## 2021-06-09 ENCOUNTER — Other Ambulatory Visit: Payer: Self-pay | Admitting: Family Medicine

## 2021-07-29 ENCOUNTER — Encounter: Payer: Self-pay | Admitting: Family Medicine

## 2021-07-29 ENCOUNTER — Ambulatory Visit: Payer: Managed Care, Other (non HMO) | Admitting: Family Medicine

## 2021-07-30 NOTE — Telephone Encounter (Signed)
See nnote

## 2021-08-12 ENCOUNTER — Ambulatory Visit (INDEPENDENT_AMBULATORY_CARE_PROVIDER_SITE_OTHER)
Admission: RE | Admit: 2021-08-12 | Discharge: 2021-08-12 | Disposition: A | Discharge status: Home / Self Care | Payer: Managed Care, Other (non HMO) | Source: Ambulatory Visit | Attending: Family Medicine | Admitting: Family Medicine

## 2021-08-12 ENCOUNTER — Encounter: Payer: Self-pay | Admitting: Family Medicine

## 2021-08-12 ENCOUNTER — Ambulatory Visit (INDEPENDENT_AMBULATORY_CARE_PROVIDER_SITE_OTHER): Payer: Managed Care, Other (non HMO) | Admitting: Family Medicine

## 2021-08-12 ENCOUNTER — Other Ambulatory Visit: Payer: Self-pay

## 2021-08-12 VITALS — BP 163/95 | HR 62 | Temp 97.2°F | Ht 66.0 in | Wt 172.4 lb

## 2021-08-12 DIAGNOSIS — R030 Elevated blood-pressure reading, without diagnosis of hypertension: Secondary | ICD-10-CM | POA: Diagnosis not present

## 2021-08-12 DIAGNOSIS — M25511 Pain in right shoulder: Secondary | ICD-10-CM

## 2021-08-12 DIAGNOSIS — Z8249 Family history of ischemic heart disease and other diseases of the circulatory system: Secondary | ICD-10-CM | POA: Diagnosis not present

## 2021-08-12 DIAGNOSIS — G8929 Other chronic pain: Secondary | ICD-10-CM

## 2021-08-12 DIAGNOSIS — M542 Cervicalgia: Secondary | ICD-10-CM

## 2021-08-12 IMAGING — DX DG SHOULDER 2+V*R*
3 series · 3 of 3 positions shown · non-contrast
Comparison: Right shoulder radiographs [DATE]

CLINICAL DATA: Chronic right shoulder and neck pain.

EXAM:
RIGHT SHOULDER - 2+ VIEW

[grashey]
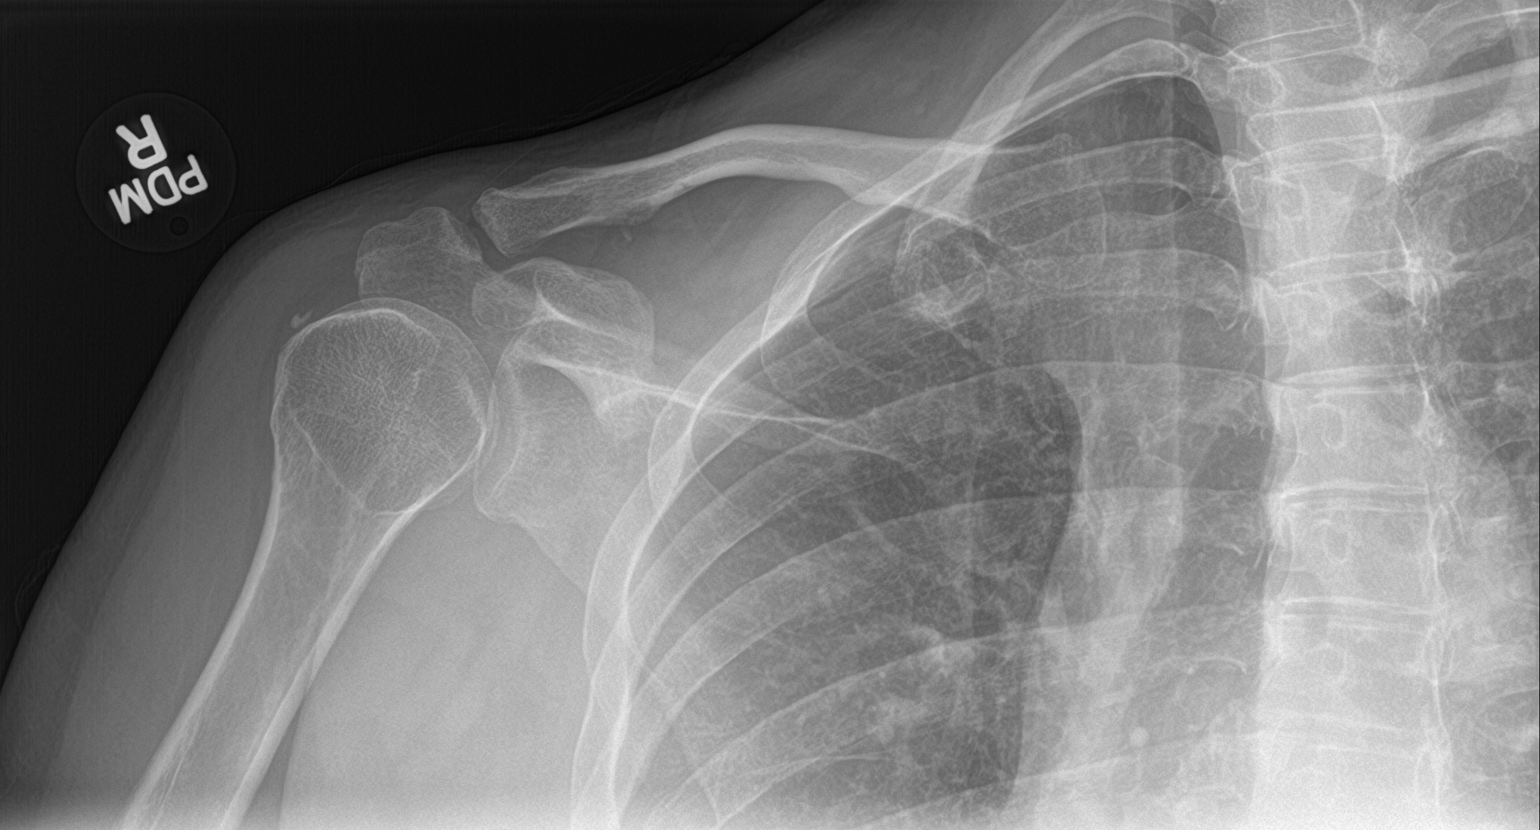

[y view]
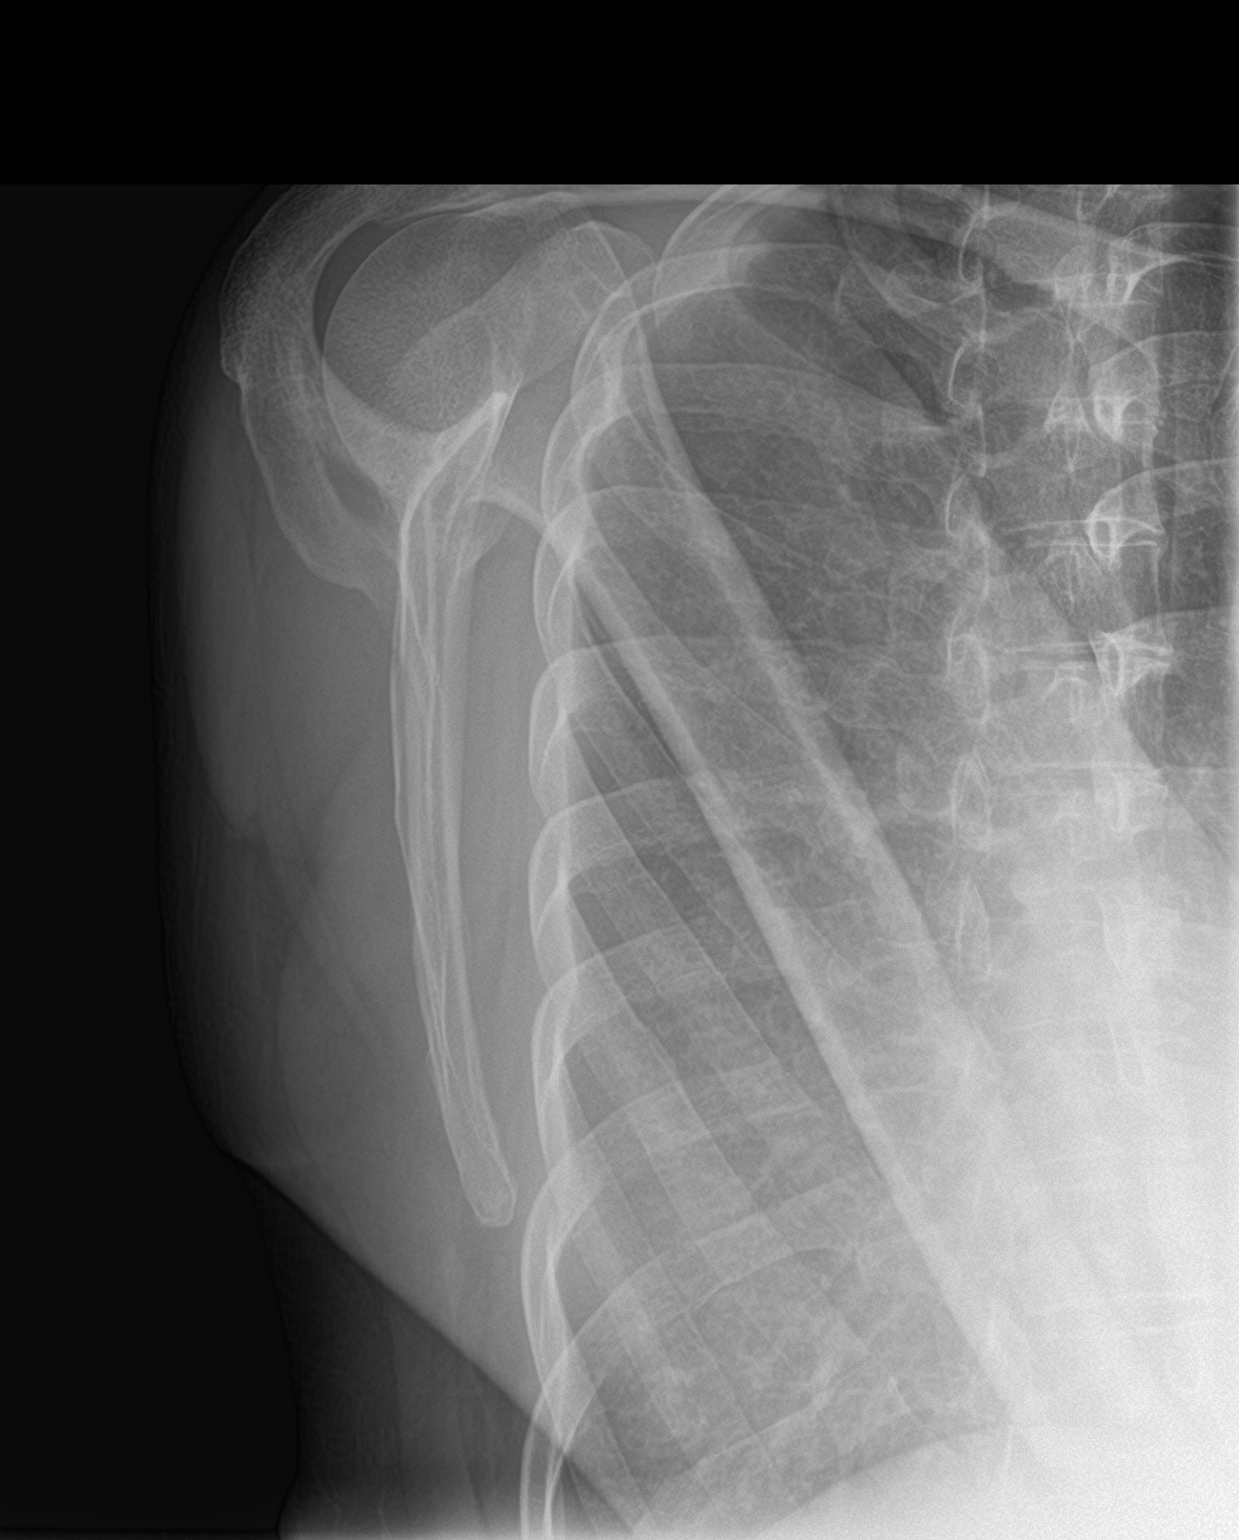

[shoulder axial]
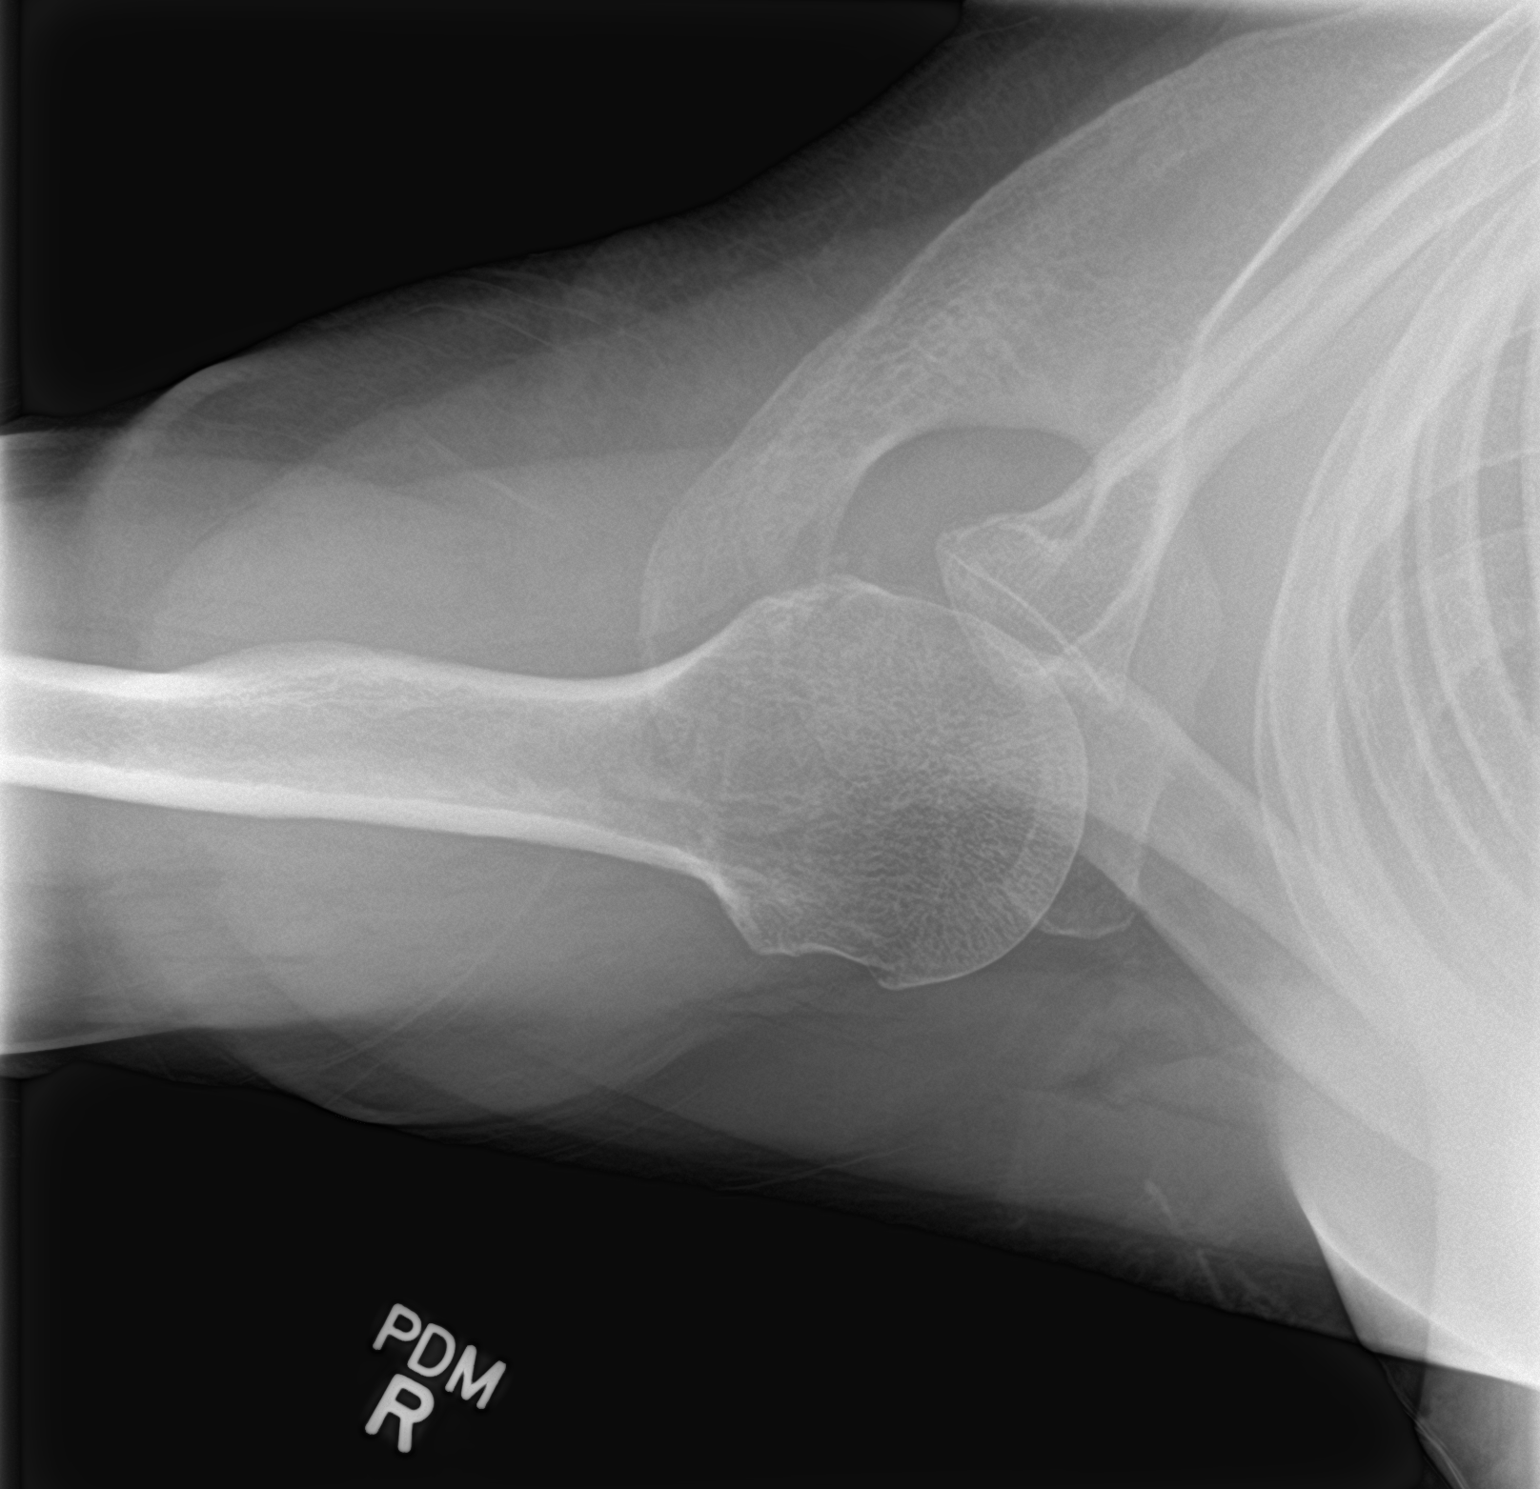

[3 of 3 positions shown; findings below may reference images not displayed]

FINDINGS: There is mild calcification measuring up to approximately 4 mm just
superior to the lateral aspect of the humeral head, decreased from 6
mm previously. This is suggestive of chronic calcific tendinosis of
the superior rotator cuff. The glenohumeral joint space is
maintained. Minimal acromioclavicular joint space narrowing and
peripheral osteophytosis degenerative change. No acute fracture is
seen. No dislocation. The visualized portion of the right lung is
unremarkable.
IMPRESSION: Mild chronic calcific tendinosis of the superior rotator cuff
slightly decreased compared to prior.

Minimal degenerative changes of the acromioclavicular joint.

## 2021-08-12 IMAGING — DX DG CERVICAL SPINE COMPLETE 4+V
5 series · 5 of 5 positions shown · non-contrast
Comparison: None.

CLINICAL DATA: Chronic neck pain

EXAM:
CERVICAL SPINE - COMPLETE 4+ VIEW

[c-spine lat]
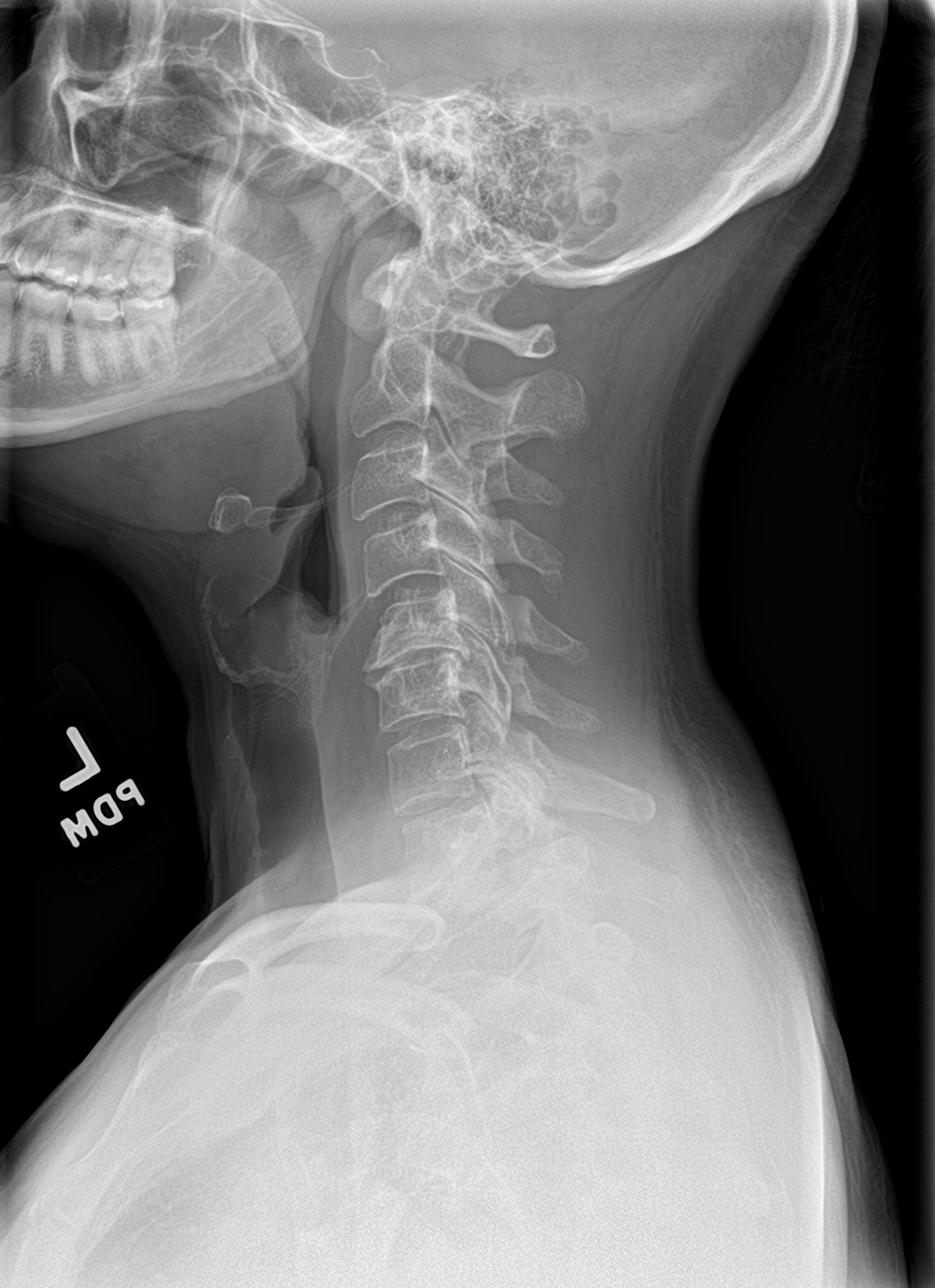

[c-spine obl (1 of 2)]
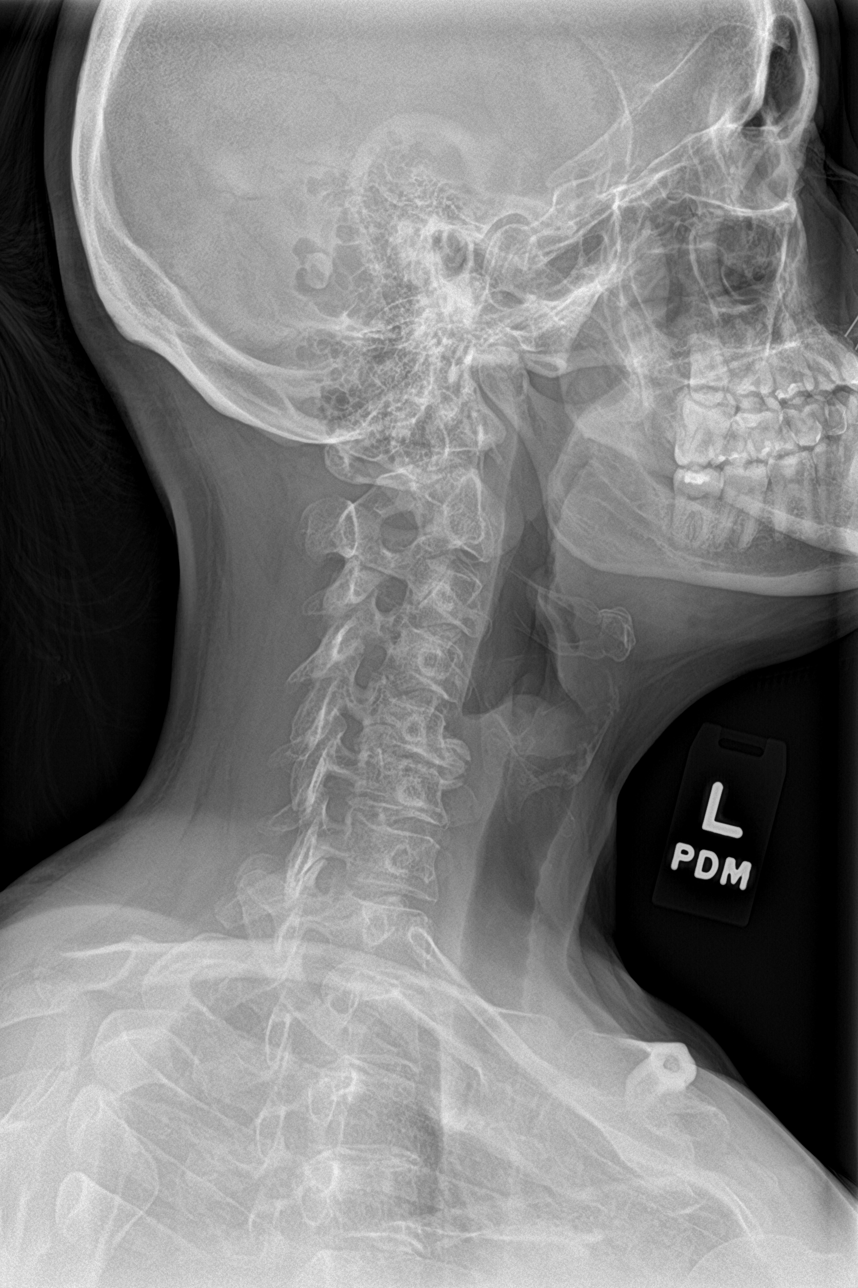

[c-spine obl (2 of 2)]
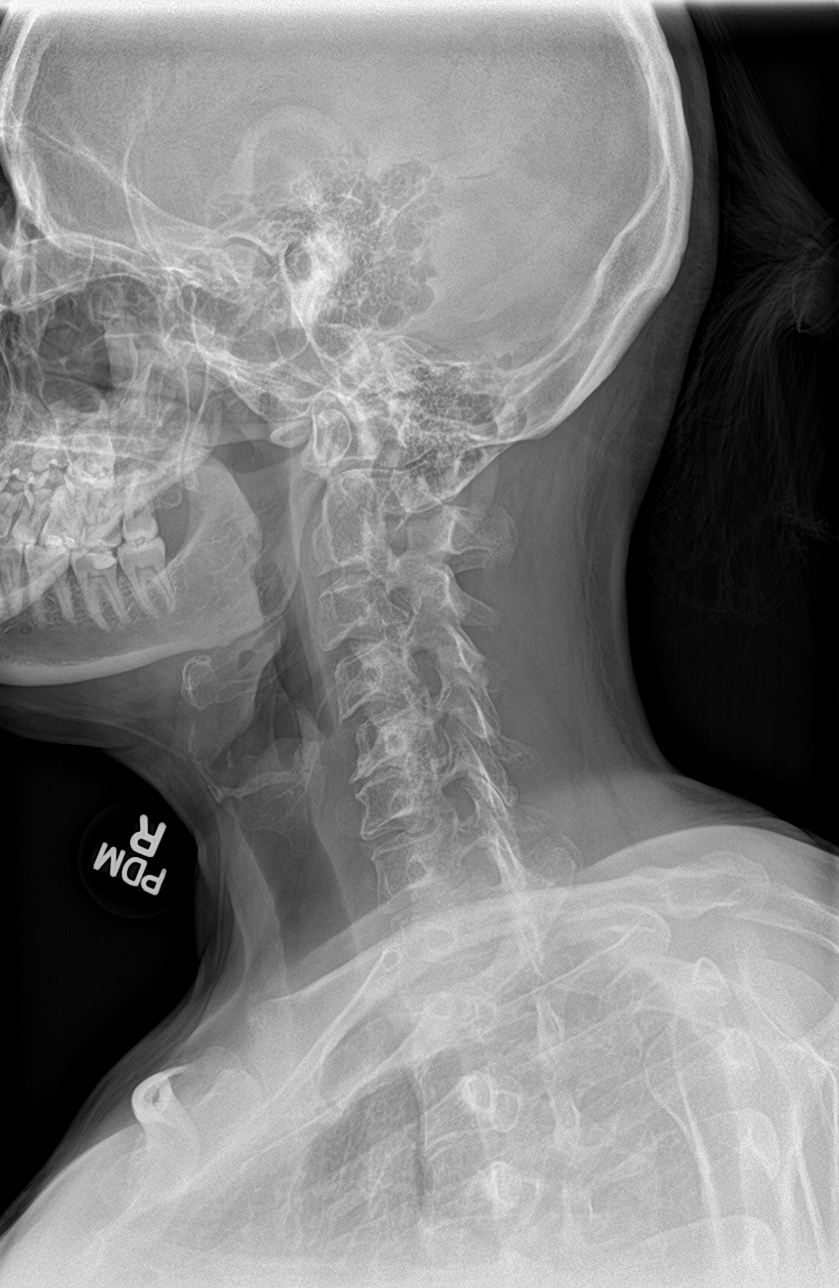

[c-spine ap]
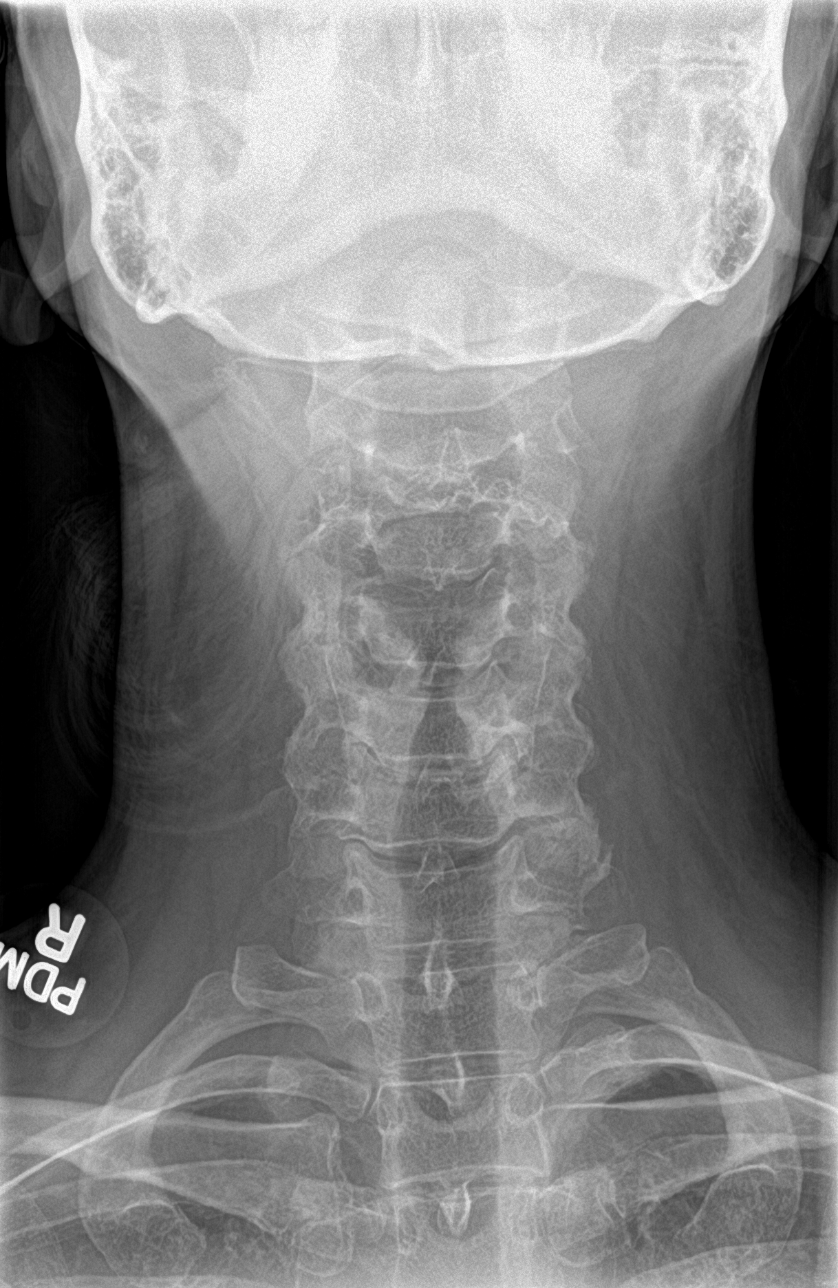

[c-spine open mouth]
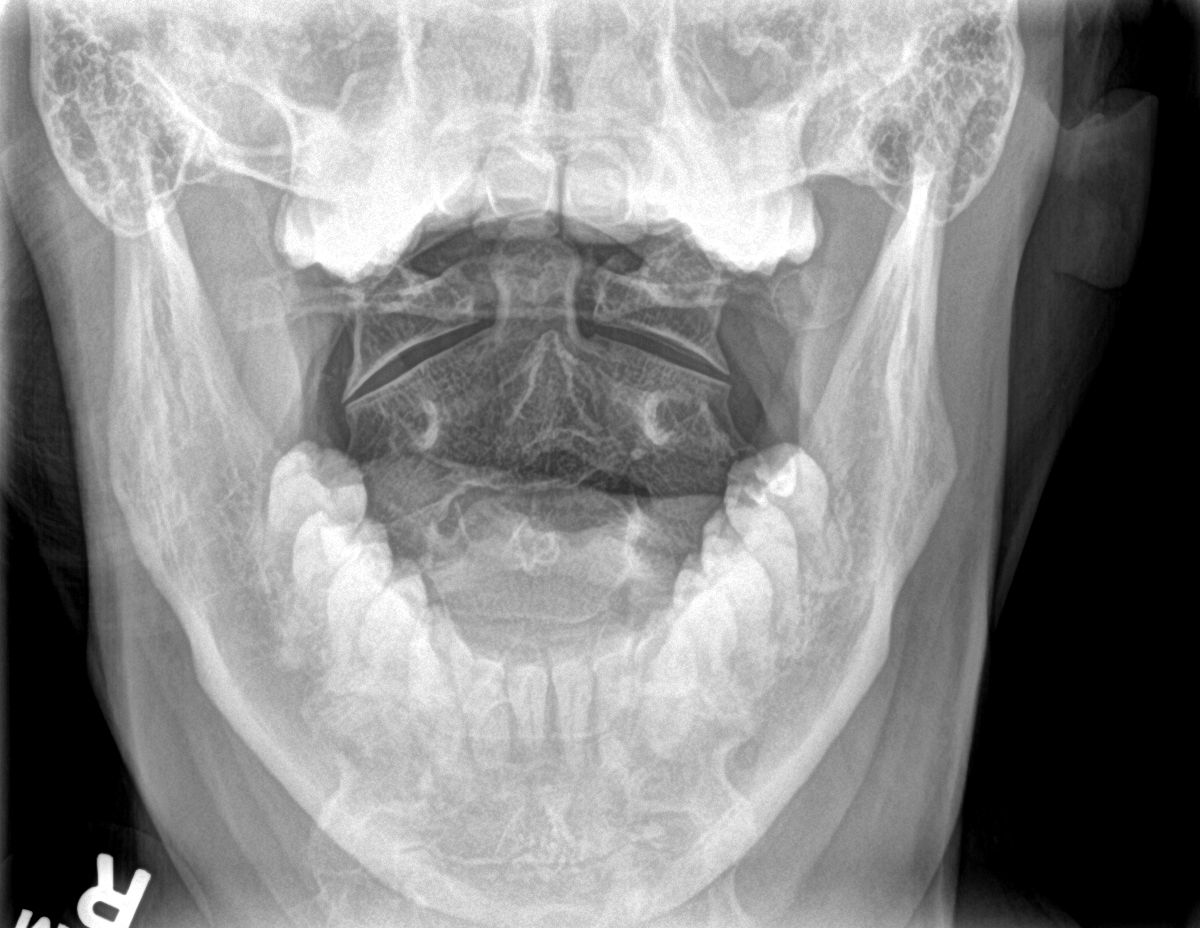

[5 of 5 positions shown; findings below may reference images not displayed]

FINDINGS: Mild straightening of the cervical spine. No acute fracture or
listhesis. Vertebral body height is preserved. There is
intervertebral disc space narrowing and endplate remodeling at C5-6
in keeping with changes of advanced degenerative disc disease.
Remaining intervertebral disc heights are preserved. Prevertebral
soft tissues are not thickened. Spinal canal is widely patent.
Oblique views demonstrate mild left neuroforaminal narrowing at C3-4
and severe left neuroforaminal narrowing at C5-6 secondary to
uncovertebral arthrosis. On the right, there is mild neuroforaminal
narrowing at C5-6 secondary to uncovertebral arthrosis.
IMPRESSION: Advanced degenerative disc disease C5-6 with resultant severe left
and mild right neuroforaminal narrowing.

## 2021-08-12 MED ORDER — PREDNISONE 10 MG PO TABS: ORAL_TABLET | ORAL | 0 refills | Status: DC

## 2021-08-12 NOTE — Patient Instructions (Signed)
Please return in 2-3 weeks to recheck pain.   Check home bp readings.   Please go to our [facility] office to get your xrays done. You can walk in M-F between 8:30am- noon or 1pm - 5pm. Tell them you are there for xrays ordered by me. They will send me the results, then I will let you know the results with instructions.   Address: [address].  The Xray department is located in the basement.    If you have any questions or concerns, please don't hesitate to send me a message via MyChart or call the office at [phone]. Thank you for visiting with [place] today! It's our pleasure caring for you.

## 2021-08-12 NOTE — Progress Notes (Signed)
pred  Subjective  CC:  Chief Complaint  Patient presents with   Hypertension    HPI: Ariana Walsh is a 51 y.o. female who presents to the office today to address the problems listed above in the chief complaint. 51 year old female here for follow-up of elevated blood pressure readings.  Today her blood pressure is very elevated but this is related to being in acute pain and being very worried about her mother who is traveling by car alone from the mountains.  She believes her blood pressure is a stress response.  However we reviewed her readings since her last checkup 6 months ago.  At home often her blood pressure is near normal although systolics will be as high as the 140s.  She did have a cardiac preop clearance evaluation.  Blood pressure was borderline there.  Cardiac clearance was granted, she is low risk although they may want to get together again and doing cardiac calcium score.  Her diet remains good although she did gain about 5 pounds after her hernia repair.  Hopefully tomorrow she will get cleared for full activity without restrictions and wants to get exercising again and lose the weight.  Her blood pressure has responded well to weight loss. Complains of chronic right-sided neck and shoulder pain has been ongoing for years.  However acute onset 2 days ago.  Pain lateral shoulder upper right back with intermittent pain down the arm.  No weakness.  No injury.  In the past, when she had the symptoms she felt like the shoulder was going out and she could hold her self in a certain position to help relieve the pain.  She is no longer to improve the pain with positional changes.  No injury.  Reports she has had a shoulder x-ray in the past.  No other work-up has been done.  Pain is moderate to severe.  Assessment  1. Prehypertension   2. Chronic right shoulder pain   3. Neck pain on right side   4. Family history of coronary artery disease      Plan   PreHypertension f/u:  at minimum has prehypertension; but discussed possibility of HTN - she elects TLC and mild weight loss. Will monitor at home and recheck next visit. Will start meds if remains elevated Neck and shoulder pain: start eval with xrays. ? Cervical djd and radiculopathy, worsening. Trial of pred. Recheck 2 weeks.   Education regarding management of these chronic disease states was given. Management strategies discussed on successive visits include dietary and exercise recommendations, goals of achieving and maintaining IBW, and lifestyle modifications aiming for adequate sleep and minimizing stressors.   Follow up: 2 weeks to recheck neck and shoulder pain  Orders Placed This Encounter  Procedures   DG Cervical Spine Complete   DG Shoulder Right   Meds ordered this encounter  Medications   predniSONE (DELTASONE) 10 MG tablet    Sig: Take 4 tabs qd x 2 days, 3 qd x 2 days, 2 qd x 2d, 1qd x 3 days    Dispense:  21 tablet    Refill:  0      BP Readings from Last 3 Encounters:  08/12/21 (!) 163/95  05/25/21 140/86  03/03/21 120/80   Wt Readings from Last 3 Encounters:  08/12/21 172 lb 6.4 oz (78.2 kg)  05/25/21 169 lb 6.4 oz (76.8 kg)  03/03/21 181 lb 12.8 oz (82.5 kg)    Lab Results  Component Value Date   CHOL  154 01/26/2021   CHOL 171 11/16/2019   CHOL 139 09/21/2018   Lab Results  Component Value Date   HDL 44.10 01/26/2021   HDL 55.00 11/16/2019   HDL 43.70 09/21/2018   Lab Results  Component Value Date   LDLCALC 79 01/26/2021   LDLCALC 101 (H) 11/16/2019   LDLCALC 80 09/21/2018   Lab Results  Component Value Date   TRIG 156.0 (H) 01/26/2021   TRIG 77.0 11/16/2019   TRIG 72.0 09/21/2018   Lab Results  Component Value Date   CHOLHDL 3 01/26/2021   CHOLHDL 3 11/16/2019   CHOLHDL 3 09/21/2018   No results found for: LDLDIRECT Lab Results  Component Value Date   CREATININE 0.76 03/03/2021   BUN 12 03/03/2021   NA 140 03/03/2021   K 4.0 03/03/2021   CL 105  03/03/2021   CO2 27 03/03/2021    The 10-year ASCVD risk score (Arnett DK, et al., 2019) is: 2%   Values used to calculate the score:     Age: 39 years     Sex: Female     Is Non-Hispanic African American: No     Diabetic: No     Tobacco smoker: No     Systolic Blood Pressure: [id number] mmHg     Is BP treated: No     HDL Cholesterol: 44.1 mg/dL     Total Cholesterol: 154 mg/dL  I reviewed the patients updated PMH, FH, and SocHx.    Patient Active Problem List   Diagnosis Date Noted   Family history of coronary artery disease 08/13/2021   Obesity (BMI 30-39.9) 01/26/2021   Keratosis pilaris 01/26/2021   Extrinsic asthma 06/02/2020   DJD (degenerative joint disease), lumbar 11/22/2019   Osteoarthritis, hip, bilateral 11/22/2019   Vitamin B12 deficiency 11/20/2019   Vegetarian 11/20/2019   OSA (obstructive sleep apnea) 10/04/2019   Exercise-induced asthma 09/21/2018   Perimenopausal symptoms [id number]   Umbilical hernia [id number]   Dystrophic nail 12/30/2016    Allergies: Latex  Social History: Patient  reports that she has never smoked. She has never used smokeless tobacco. She reports that she does not currently use alcohol. She reports that she does not use drugs.  Current Meds  Medication Sig   cetirizine (ZYRTEC) 10 MG tablet Take 1 tablet (10 mg total) by mouth daily.   montelukast (SINGULAIR) 10 MG tablet TAKE ONE TABLET AT BEDTIME.   Peak Flow Meter DEVI Use as needed to check your breathing. Log your values   predniSONE (DELTASONE) 10 MG tablet Take 4 tabs qd x 2 days, 3 qd x 2 days, 2 qd x 2d, 1qd x 3 days   PROAIR HFA 108 (90 Base) MCG/ACT inhaler USE 2 PUFFS EVERY 4 HOURS AS NEEDED FOR COUGH OR SHORTNESS OF BREATH.    Review of Systems: Cardiovascular: negative for chest pain, palpitations, leg swelling, orthopnea Respiratory: negative for SOB, wheezing or persistent cough Gastrointestinal: negative for abdominal pain Genitourinary: negative for dysuria or  gross hematuria  Objective  Vitals: BP (!) 163/95    Pulse 62    Temp (!) 97.2 F (36.2 C) (Temporal)    Ht 5\' 6"  (1.676 m)    Wt 172 lb 6.4 oz (78.2 kg)    BMI 27.83 kg/m  General: no acute distress  Psych:  Alert and oriented, normal mood and affect HEENT:  Normocephalic, atraumatic, supple neck , + tender right traps, no cervical spine ttp. From. Neg spurlings Cardiovascular:  RRR without murmur. no edema  Respiratory:  Good breath sounds bilaterally, CTAB with normal respiratory effort Skin:  Warm, no rashes Neurologic:   Mental status is normal Right shoulder tender lateral deltoid. From. Neg empty can testing. Commons side effects, risks, benefits, and alternatives for medications and treatment plan prescribed today were discussed, and the patient expressed understanding of the given instructions. Patient is instructed to call or message via MyChart if he/she has any questions or concerns regarding our treatment plan. No barriers to understanding were identified. We discussed Red Flag symptoms and signs in detail. Patient expressed understanding regarding what to do in case of urgent or emergency type symptoms.  Medication list was reconciled, printed and provided to the patient in AVS. Patient instructions and summary information was reviewed with the patient as documented in the AVS. This note was prepared with assistance of Dragon voice recognition software. Occasional wrong-word or sound-a-like substitutions may have occurred due to the inherent limitations of voice recognition software  This visit occurred during the SARS-CoV-2 public health emergency.  Safety protocols were in place, including screening questions prior to the visit, additional usage of staff PPE, and extensive cleaning of exam room while observing appropriate contact time as indicated for disinfecting solutions.

## 2021-08-13 DIAGNOSIS — Z8249 Family history of ischemic heart disease and other diseases of the circulatory system: Secondary | ICD-10-CM | POA: Insufficient documentation

## 2021-08-28 ENCOUNTER — Other Ambulatory Visit: Payer: Self-pay

## 2021-08-28 ENCOUNTER — Encounter: Payer: Self-pay | Admitting: Family Medicine

## 2021-08-28 ENCOUNTER — Ambulatory Visit (INDEPENDENT_AMBULATORY_CARE_PROVIDER_SITE_OTHER): Payer: Managed Care, Other (non HMO) | Admitting: Family Medicine

## 2021-08-28 VITALS — BP 140/79 | HR 65 | Temp 98.0°F | Ht 66.0 in | Wt 168.6 lb

## 2021-08-28 DIAGNOSIS — M19011 Primary osteoarthritis, right shoulder: Secondary | ICD-10-CM

## 2021-08-28 DIAGNOSIS — M4802 Spinal stenosis, cervical region: Secondary | ICD-10-CM

## 2021-08-28 DIAGNOSIS — M4722 Other spondylosis with radiculopathy, cervical region: Secondary | ICD-10-CM | POA: Diagnosis not present

## 2021-08-28 DIAGNOSIS — M12811 Other specific arthropathies, not elsewhere classified, right shoulder: Secondary | ICD-10-CM

## 2021-08-28 DIAGNOSIS — R030 Elevated blood-pressure reading, without diagnosis of hypertension: Secondary | ICD-10-CM

## 2021-08-28 NOTE — Patient Instructions (Signed)
Please return in July for your complete physical. Come fasting.   We will call you to get you set up for an MRI of your neck.   If you have any questions or concerns, please don't hesitate to send me a message via MyChart or call the office at [phone]. Thank you for visiting with [place] today! It's our pleasure caring for you.

## 2021-08-28 NOTE — Progress Notes (Signed)
Subjective  CC:  Chief Complaint  Patient presents with   Follow-up    Right shoulder and Right side of neck pain. The pain is not as painful as before. Would like to Discuss Xray.     HPI: Ariana Walsh is a 51 y.o. female who presents to the office today to address the problems listed above in the chief complaint. See last note. Several year h/o right neck and shoulder and arm pain: now moderate to severe at times with weekly problems. Treated with pred and that helped. She is averse to taking more medications. Xrays revealed severe DJD at c5-6 w/ bilateral neuroformaminal stenosis at a few levels and chronic rotator tendonopathy on right with ac joint djd. Pt has perceived weakness on right: hand will shake when lifting cup up to drink etc. No relief with tylenol or mm relaxers in past. Has not had prior imaging or interventions.  Prehypertension: now monitoring at home: 130s-140s/80s. Working onweight loss    Assessment  1. Osteoarthritis of spine with radiculopathy, cervical region   2. Osteoarthritis of right acromioclavicular joint   3. Rotator cuff arthropathy of right shoulder   4. Neuroforaminal stenosis of cervical spine   5. Prehypertension      Plan  Neck and arm pain :  multifactorial but stemming from neck mostly. Needs MRI to better assess. To NS or ortho pending results. In the meantime she will try a neck support/massage and chiropractor. Declines gabapentin trial Prehypertension: salt restrict and weight loss and recheck at her physical.    Follow up: July for cpe  Visit date not found  No orders of the defined types were placed in this encounter.  No orders of the defined types were placed in this encounter.     I reviewed the patients updated PMH, FH, and SocHx.    Patient Active Problem List   Diagnosis Date Noted   Osteoarthritis of spine with radiculopathy, cervical region 08/28/2021   Osteoarthritis of right acromioclavicular joint  08/28/2021   Rotator cuff arthropathy of right shoulder 08/28/2021   Neuroforaminal stenosis of cervical spine 08/28/2021   Family history of coronary artery disease 08/13/2021   Obesity (BMI 30-39.9) 01/26/2021   Keratosis pilaris 01/26/2021   Extrinsic asthma 06/02/2020   DJD (degenerative joint disease), lumbar 11/22/2019   Osteoarthritis, hip, bilateral 11/22/2019   Vitamin B12 deficiency 11/20/2019   Vegetarian 11/20/2019   OSA (obstructive sleep apnea) 10/04/2019   Exercise-induced asthma 09/21/2018   Perimenopausal symptoms 02/17/2018   Umbilical hernia 12/30/2016   Dystrophic nail 12/30/2016   Current Meds  Medication Sig   cetirizine (ZYRTEC) 10 MG tablet Take 1 tablet (10 mg total) by mouth daily.   Eflornithine HCl (VANIQA) 13.9 % cream Apply to area twice a day   montelukast (SINGULAIR) 10 MG tablet TAKE ONE TABLET AT BEDTIME.   Peak Flow Meter DEVI Use as needed to check your breathing. Log your values   PROAIR HFA 108 (90 Base) MCG/ACT inhaler USE 2 PUFFS EVERY 4 HOURS AS NEEDED FOR COUGH OR SHORTNESS OF BREATH.    Allergies: Patient is allergic to latex. Family History: Patient family history includes Arthritis in her father; Breast cancer in her cousin and mother; Cancer (age of onset: 45) in her father; Diabetes in her father; Endometrial cancer in her mother; Healthy in her son; Heart disease in her father; Hypertension in her father and mother; Mental illness in her brother and father. Social History:  Patient  reports that she  has never smoked. She has never used smokeless tobacco. She reports that she does not currently use alcohol. She reports that she does not use drugs.  Review of Systems: Constitutional: Negative for fever malaise or anorexia Cardiovascular: negative for chest pain Respiratory: negative for SOB or persistent cough Gastrointestinal: negative for abdominal pain  Objective  Vitals: BP 140/79    Pulse 65    Temp 98 F (36.7 C) (Temporal)     Ht 5\' 6"  (1.676 m)    Wt 168 lb 9.6 oz (76.5 kg)    SpO2 98%    BMI 27.21 kg/m  General: no acute distress , A&Ox3 Neuro: nl UE DTRs, bilateral tremor present on FtoN Strength: 5/5 throughout bilateral upper extremities     Commons side effects, risks, benefits, and alternatives for medications and treatment plan prescribed today were discussed, and the patient expressed understanding of the given instructions. Patient is instructed to call or message via MyChart if he/she has any questions or concerns regarding our treatment plan. No barriers to understanding were identified. We discussed Red Flag symptoms and signs in detail. Patient expressed understanding regarding what to do in case of urgent or emergency type symptoms.  Medication list was reconciled, printed and provided to the patient in AVS. Patient instructions and summary information was reviewed with the patient as documented in the AVS. This note was prepared with assistance of Dragon voice recognition software. Occasional wrong-word or sound-a-like substitutions may have occurred due to the inherent limitations of voice recognition software  This visit occurred during the SARS-CoV-2 public health emergency.  Safety protocols were in place, including screening questions prior to the visit, additional usage of staff PPE, and extensive cleaning of exam room while observing appropriate contact time as indicated for disinfecting solutions.

## 2021-09-07 ENCOUNTER — Telehealth: Payer: Self-pay

## 2021-09-07 NOTE — Telephone Encounter (Signed)
Patient states she has an MRI scheduled for 3/11.  Is requesting a medication to calm her down in order to complete the MRI.  Patient uses [facility].

## 2021-09-08 NOTE — Telephone Encounter (Signed)
Please advise 

## 2021-09-09 MED ORDER — ALPRAZOLAM 0.5 MG PO TABS: 0.5000 mg | ORAL_TABLET | Freq: Once | ORAL | 0 refills | Status: AC

## 2021-09-10 NOTE — Telephone Encounter (Signed)
Xanax ordered

## 2021-09-19 ENCOUNTER — Other Ambulatory Visit: Payer: Managed Care, Other (non HMO)

## 2021-09-22 ENCOUNTER — Telehealth: Payer: Self-pay | Admitting: Family Medicine

## 2021-09-22 NOTE — Telephone Encounter (Signed)
Pt stated she received a call saying her MRI has been declined per her insurance. Patient stated she was told 6 weeks of treatment was needed for insurance to approve MRI.- Patient was also told that she would have to see a neurologist/neurosurgeon- she agrees to anything that needs to be done.  ?

## 2021-09-23 NOTE — Telephone Encounter (Signed)
Spoke to pt> refer to NS ?

## 2021-09-24 ENCOUNTER — Other Ambulatory Visit: Payer: Self-pay

## 2021-09-30 NOTE — Therapy (Signed)
?OUTPATIENT PHYSICAL THERAPY FEMALE PELVIC EVALUATION ? ? ?Patient Name: Ariana Walsh ?MRN: [id number] ?DOB:[birth year], 51 y.o., female ?Today's Date: 10/01/2021 ? ? PT End of Session - 10/01/21 1508   ? ? Visit Number 1   ? Date for PT Re-Evaluation 12/24/21   ? Authorization Type cigna   ? PT Start Time 1147   ? PT Stop Time 1225   ? PT Time Calculation (min) 38 min   ? Activity Tolerance Patient tolerated treatment well   ? Behavior During Therapy [facility] for tasks assessed/performed   ? ?  ?  ? ?  ? ? ?Past Medical History:  ?Diagnosis Date  ? DJD (degenerative joint disease), lumbar 11/22/2019  ? Xray 11/2019  ? Exercise-induced asthma 09/21/2018  ? Allergy induced also  ? Hernia, umbilical   ? Osteoarthritis, hip, bilateral 11/22/2019  ? xrays 11/2019  ? Vitamin B12 deficiency 11/20/2019  ? ?Past Surgical History:  ?Procedure Laterality Date  ? DENTAL SURGERY    ? HERNIA REPAIR    ? IVF    ? egg collection  ? ?Patient Active Problem List  ? Diagnosis Date Noted  ? Osteoarthritis of spine with radiculopathy, cervical region 08/28/2021  ? Osteoarthritis of right acromioclavicular joint 08/28/2021  ? Rotator cuff arthropathy of right shoulder 08/28/2021  ? Neuroforaminal stenosis of cervical spine 08/28/2021  ? Family history of coronary artery disease 08/13/2021  ? Obesity (BMI 30-39.9) 01/26/2021  ? Keratosis pilaris 01/26/2021  ? Extrinsic asthma 06/02/2020  ? DJD (degenerative joint disease), lumbar 11/22/2019  ? Osteoarthritis, hip, bilateral 11/22/2019  ? Vitamin B12 deficiency 11/20/2019  ? Vegetarian 11/20/2019  ? OSA (obstructive sleep apnea) 10/04/2019  ? Exercise-induced asthma 09/21/2018  ? Perimenopausal symptoms 02/17/2018  ? Umbilical hernia 12/30/2016  ? Dystrophic nail 12/30/2016  ? ? ?PCP: [name], MD ? ?REFERRING PROVIDER: [name], MD ? ?REFERRING DIAG:  ?Z87.19 (ICD-10-CM) - Personal history of other diseases of the digestive system  ?Z98.890,Z87.19 (ICD-10-CM) - H/O umbilical  hernia repair  ? ? ?THERAPY DIAG:  ?Muscle weakness (generalized) ? ?Abnormal posture ? ?ONSET DATE: 06/12/21 ? ?SUBJECTIVE:                                                                                                                                                                                          ? ?SUBJECTIVE STATEMENT: ?I feel like I get caught in the center of the sternum below the ribcage.  It depends on the way I sit or move in a certain way.  I am doing recovery program for core strength and it is Level 1.  A lot of quadruped  type of things right now.  My doctor thinks PT with help release the tension that is getting stuck and I want to be able to exercises without fear of too much pressure. ? ? ?PAIN:  ?Are you having pain? Yes ?NPRS scale: 2/10 ?Pain location: Anterior and abdominal ? ?Pain type: sharp ?Pain description: sharp and like bumping into something   ? ?Aggravating factors: moving a certain way ?Relieving factors: change position ? ?PRECAUTIONS: None ? ?WEIGHT BEARING RESTRICTIONS No ? ?FALLS:  ?Has patient fallen in last 6 months? No, Number of falls: 0 ? ?LIVING ENVIRONMENT: ?Lives with: lives with their son ?Lives in: House/apartment ? ? 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 ?PLOF: Independent ? ?PATIENT GOALS get rid of the pain and integrate my core into doing yoga again confidently, lifting furniture ? ?PERTINENT HISTORY:  ?Hernia since 2017 ? ? ?BOWEL MOVEMENT ?Pain with bowel movement: No ?Type of bowel movement:Type (Bristol Stool Scale) some constipation  , not straining ? ? ?URINATION ?Pain with urination: No ?Fully empty bladder: No ?Stream: Strong ?Urgency: No ?Frequency: normal ?Leakage: Laughing and Exercise ?Pads: No ? ? ?PREGNANCY ?Vaginal deliveries 1 ?Tearing No  ?In labor 42 hours ? ? ?PROLAPSE ?None ? ? ? ?OBJECTIVE:  ? ? ? ? ?COGNITION: ? Overall cognitive status: Within functional limits for tasks assessed   ?  ? ? ?MUSCLE LENGTH: ?Hamstrings:  Right 75 deg; Left 75 deg ? ? ?LUMBAR SPECIAL TESTS:  ?ASLR has instability with Lt LE ? ?FUNCTIONAL TESTS:  ?SLS trendelenburg on Lt LE ?Squat normal ? ?GAIT: ? ?Comments: WFL ? ?POSTURE:  ?Anterior pelvic tilt; rounded shoulders ? ?LUMBARAROM/PROM ? ?A/PROM A/PROM  ?10/01/2021  ?Flexion 80%  ?Extension   ?Right lateral flexion   ?Left lateral flexion   ?Right rotation   ?Left rotation   ? (Blank rows = not tested) ? ?LE ROM: ? ?Passive ROM Right ?10/01/2021 Left ?10/01/2021  ?Hip flexion    ?Hip extension    ?Hip abduction    ?Hip adduction    ?Hip internal rotation normal 90%  ?Hip external rotation normal 75%  ?Knee flexion    ?Knee extension    ?Ankle dorsiflexion    ?Ankle plantarflexion    ?Ankle inversion    ?Ankle eversion    ? (Blank rows = not tested) ? ?LE MMT: ? ?MMT Right ?10/01/2021 Left ?10/01/2021  ?Hip flexion    ?Hip extension 4/5 4/5  ?Hip abduction 5/5 4/5  ?Hip adduction 4+/5 4/5  ?Hip internal rotation    ?Hip external rotation    ?Knee flexion    ?Knee extension    ?Ankle dorsiflexion    ?Ankle plantarflexion    ?Ankle inversion    ?Ankle eversion    ? ? ?(Blank rows = not tested) ? ?      PALPATION: ?  General  - lumbar and thoracic erectors tight; umbilical incision scar tight and TTP; Lt transversus abdominus less active ? ?              External Perineal Exam - able to contract and relax correctly ?              ?              Internal Pelvic Floor-  deferred ? ? ? ?TODAY'S TREATMENT  ? ?10/01/21 ?EVAL ; educated on scar massage; medbridge ASLR with abdominal bracing ? ? ?PATIENT EDUCATION:  ?Education details: scar massage; medbridge ASLR with abdominal bracing,  thoracic rotation stretch with breathing ?Person educated: Patient ?Education method: Explanation, Demonstration, Tactile cues, Verbal cues, and Handouts ?Education comprehension: verbalized understanding ? ? ?HOME EXERCISE PROGRAM: ? ?Access Code: [email or web address] ?URL: [email or web address] ?Date: 10/01/2021 ?Prepared by:  Dwana Curd ? ?Exercises ?- Small Range Straight Leg Raise  - 1 x daily - 7 x weekly - 3 sets - 10 reps ?- Thoracic rotation bow and arrow stretch  - 1 x daily - 7 x weekly - 3 sets - 10 reps ? ? ?ASSESSMENT: ? ?CLINICAL IMPRESSION: ?Patient is a 51 y.o. female who was seen today for physical therapy evaluation and treatment for abdominal pain s/p umbilical hernia repair with mesh. Pt had large hematoma after the surgery.  Prior to surgery hernia occurred after pregnancy which was at age 74 and pushing for 42 hours.  Pt has muscle imbalances throughout the trunk including abdominal weakness Lt>Rt, bil hip weakness and muscle coordination with exercise.  Pt will benefit from skilled PT to address all above mentioned impairments in order to safely return to maximum function ? ? ?OBJECTIVE IMPAIRMENTS decreased activity tolerance, decreased coordination, decreased endurance, decreased strength, increased fascial restrictions, and pain.  ? ?ACTIVITY LIMITATIONS community activity and exercise .  ? ?PERSONAL FACTORS 1-2 comorbidities: difficult delivery; hernia repair  are also affecting patient's functional outcome.  ? ? ?REHAB POTENTIAL: Excellent ? ?CLINICAL DECISION MAKING: Stable/uncomplicated ? ?EVALUATION COMPLEXITY: Low ? ? ?GOALS: ?Goals reviewed with patient? Yes ? ?SHORT TERM GOALS: Target date: 10/29/2021 ? ?Ind with initial HEP ?Baseline:  ?Goal status: INITIAL ? ?2.  Pt will report 25% less pain around the incision due to improved soft tissue mobility ?Baseline:  ?Goal status: INITIAL ? ?LONG TERM GOALS: Target date: 12/24/2021 ? ?Pt will be independent with advanced HEP to maintain improvements made throughout therapy ? ?Baseline:  ?Goal status: INITIAL ? ?2.  Pt will report 80% reduction of pain due to improvements in posture, strength, and muscle length ? ?Baseline:  ?Goal status: INITIAL ? ?3.  Pt will be able to confidently and safely return to yoga due to improved core strength ?Baseline:   ?Goal status: INITIAL ? ?4.  Pt will have 5/5 MMT of bil hip strength for improved stability of trunk in single leg activities ?Baseline:  ?Goal status: INITIAL ? ? ?PLAN: ?PT FREQUENCY: 1x/week ? ?PT DURATION: 12 wee

## 2021-10-01 ENCOUNTER — Ambulatory Visit: Payer: Managed Care, Other (non HMO) | Attending: General Surgery | Admitting: Physical Therapy

## 2021-10-01 ENCOUNTER — Other Ambulatory Visit: Payer: Self-pay

## 2021-10-01 ENCOUNTER — Encounter: Payer: Self-pay | Admitting: Physical Therapy

## 2021-10-01 DIAGNOSIS — R293 Abnormal posture: Secondary | ICD-10-CM | POA: Insufficient documentation

## 2021-10-01 DIAGNOSIS — M6281 Muscle weakness (generalized): Secondary | ICD-10-CM | POA: Insufficient documentation

## 2021-10-08 ENCOUNTER — Other Ambulatory Visit: Payer: Self-pay | Admitting: Neurosurgery

## 2021-10-08 DIAGNOSIS — M25511 Pain in right shoulder: Secondary | ICD-10-CM

## 2021-10-08 DIAGNOSIS — M4722 Other spondylosis with radiculopathy, cervical region: Secondary | ICD-10-CM

## 2021-10-12 ENCOUNTER — Other Ambulatory Visit: Payer: Self-pay | Admitting: Family Medicine

## 2021-10-13 NOTE — Telephone Encounter (Signed)
Pt wanted to inform Dr Mardelle Matte that she is completely out of her medication and is needing this very soon ?

## 2021-10-14 ENCOUNTER — Other Ambulatory Visit: Payer: Self-pay

## 2021-10-15 ENCOUNTER — Encounter: Payer: Self-pay | Admitting: Family Medicine

## 2021-10-24 ENCOUNTER — Ambulatory Visit
Admission: RE | Admit: 2021-10-24 | Discharge: 2021-10-24 | Disposition: A | Discharge status: Home / Self Care | Payer: Managed Care, Other (non HMO) | Source: Ambulatory Visit | Attending: Neurosurgery | Admitting: Neurosurgery

## 2021-10-24 DIAGNOSIS — M25511 Pain in right shoulder: Secondary | ICD-10-CM

## 2021-10-24 DIAGNOSIS — M4722 Other spondylosis with radiculopathy, cervical region: Secondary | ICD-10-CM

## 2021-10-24 IMAGING — MR MR CERVICAL SPINE W/O CM
4 of 5 series · 28 of 48 positions shown · non-contrast
Comparison: Prior radiograph from [DATE].

CLINICAL DATA: Initial evaluation for chronic neck and right
shoulder pain.

EXAM:
MRI CERVICAL SPINE WITHOUT CONTRAST
TECHNIQUE: Multiplanar, multisequence MR imaging of the cervical spine was
performed. No intravenous contrast was administered.

[Series 2: T2 · sagittal · 3.0mm · 0.66mm/px · 8 of 15 slices shown (1 of 2)]
[im 1/15]
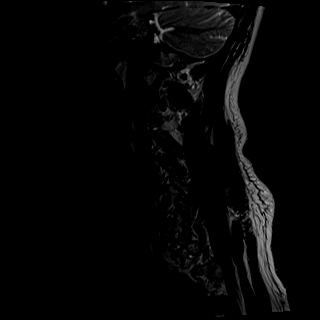
[im 3/15]
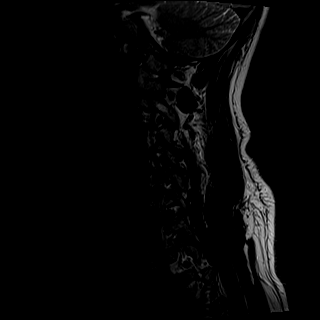
[im 5/15]
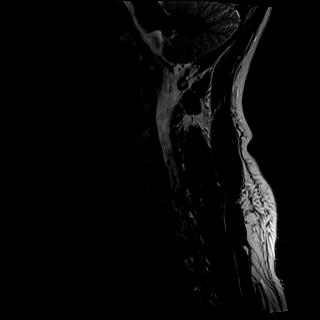
[im 7/15]
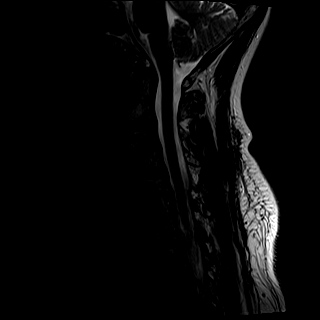
[im 9/15]
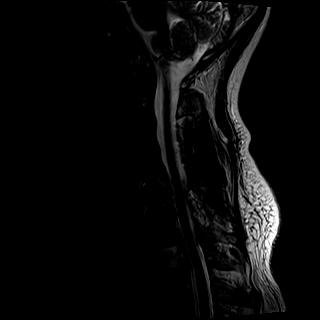
[im 11/15]
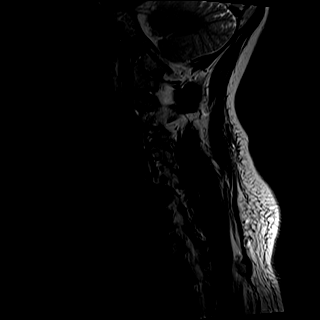
[im 13/15]
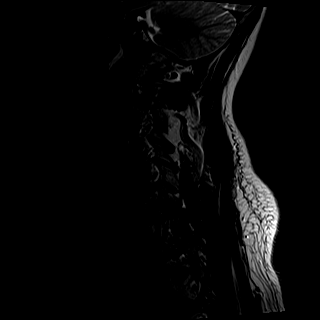
[im 15/15]
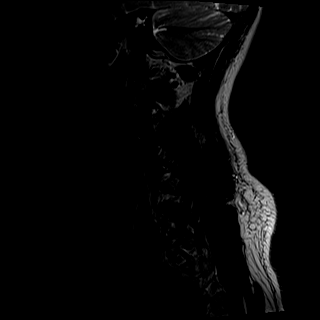

[Series 3: T1 · sagittal · 3.0mm · 0.41mm/px · 7 of 15 slices shown]
[im 1/15]
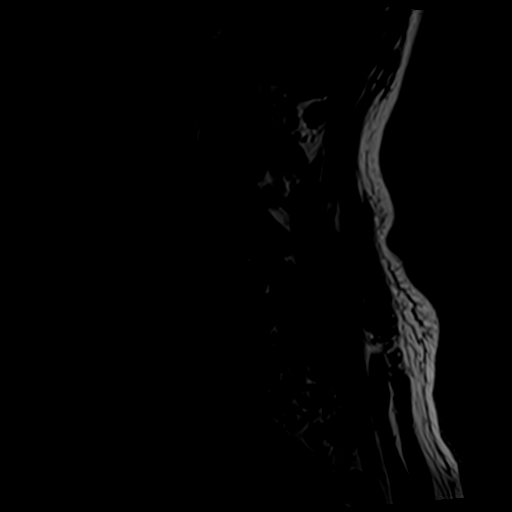
[im 3/15]
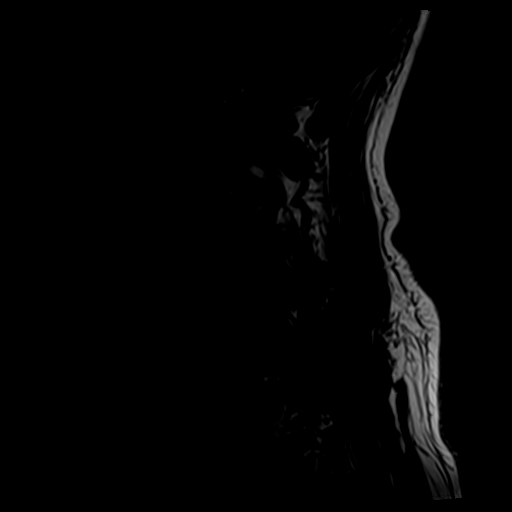
[im 5/15]
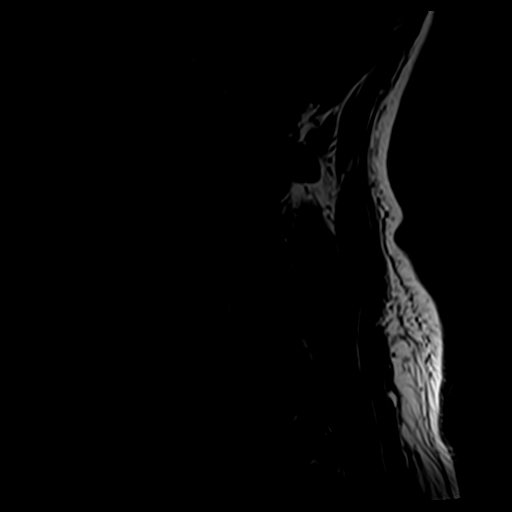
[im 8/15]
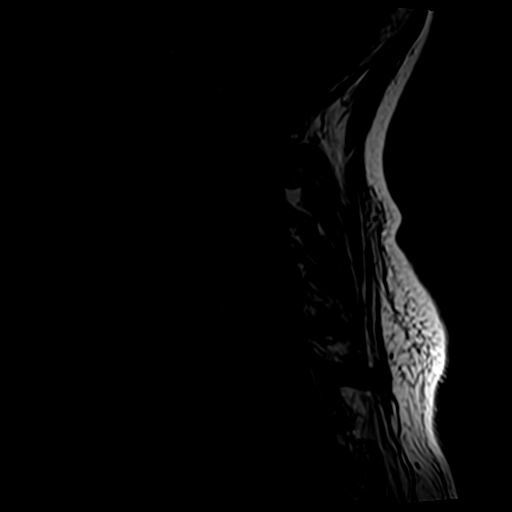
[im 10/15]
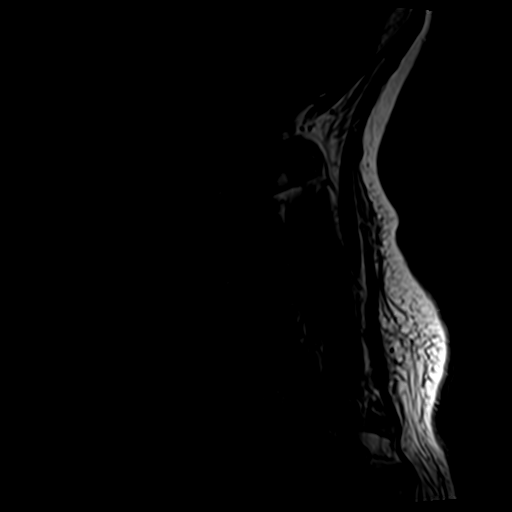
[im 12/15]
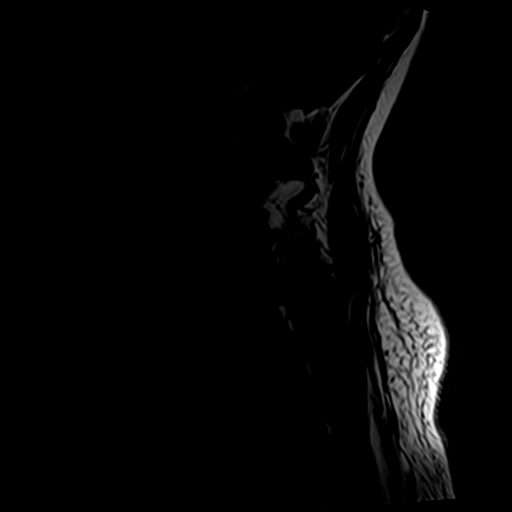
[im 15/15]
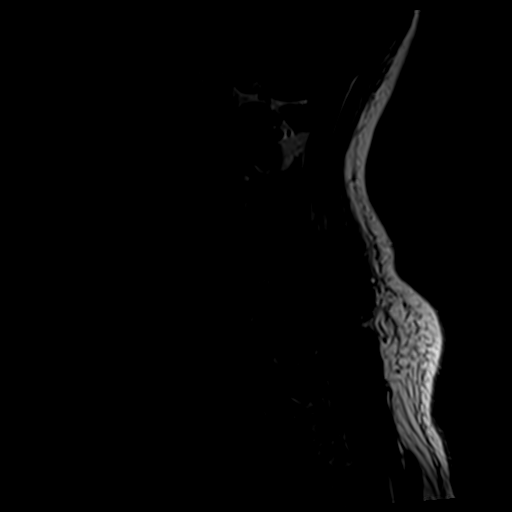

[Series 4: tir sag · sagittal · 3.0mm · 0.41mm/px · 4 of 15 slices shown]
[im 1/15]
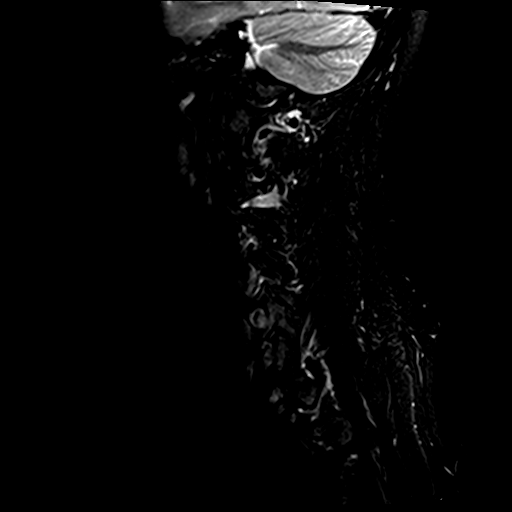
[im 3/15]
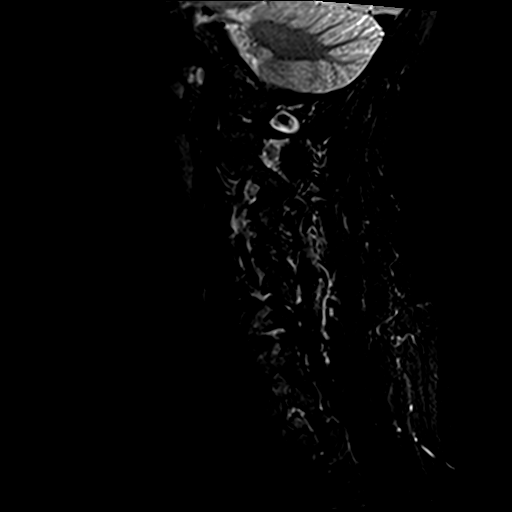
[im 8/15]
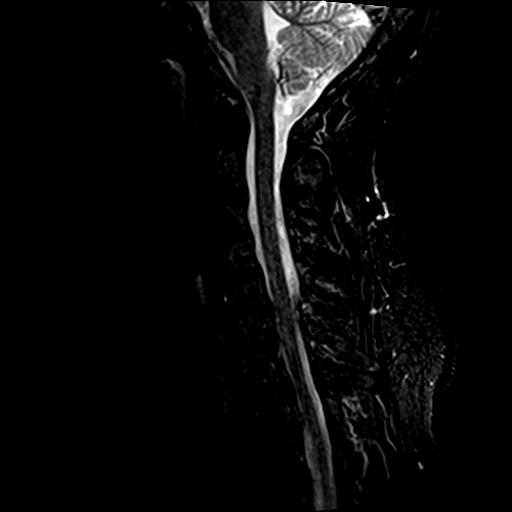
[im 12/15]
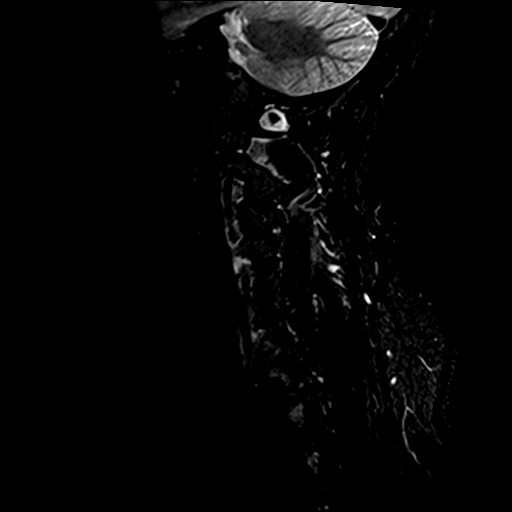

[Series 6: T2 · axial · 3.0mm · 0.70mm/px · z∈[-57,+39]mm · 9 of 27 slices shown (2 of 2)]
[im 1/27]
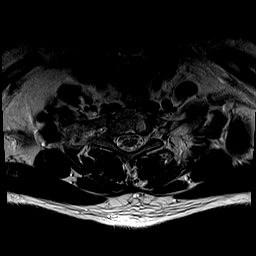
[im 5/27]
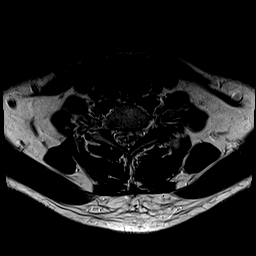
[im 9/27]
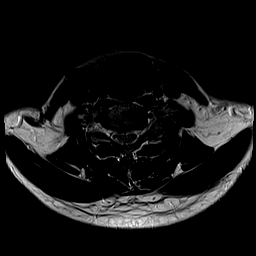
[im 11/27]
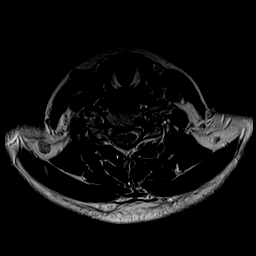
[im 14/27]
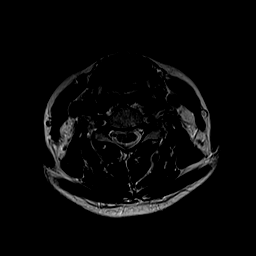
[im 16/27]
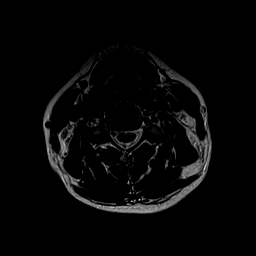
[im 18/27]
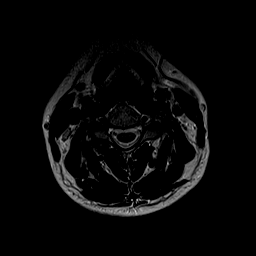
[im 22/27]
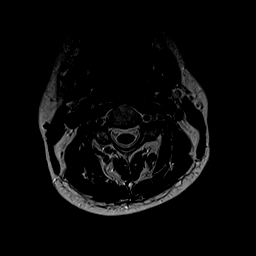
[im 27/27]
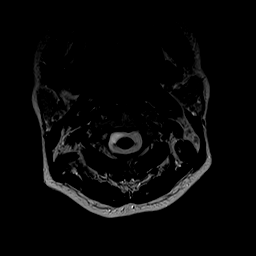

[28 of 48 positions shown; findings below may reference images not displayed]

FINDINGS: Alignment: Straightening of the normal cervical lordosis. Trace
degenerative anterolisthesis of C3 on C4 and C4 on C5, with trace
retrolisthesis of C5 on C6. Trace facet mediated anterolisthesis of
C7 on T1 noted as well.

Vertebrae: Vertebral body height well maintained without acute or
chronic fracture. Normal bone marrow signal intensity. 1.5 cm benign
hemangioma within the T1 vertebral body. No worrisome osseous
lesions or abnormal marrow edema.

Cord: Small focus of chronic myelomalacia present within the right
hemi cord at the level of C7-T1 (series 6, image 23). The cord is
overall somewhat atrophic at the levels of C6-7 and C7-T1 as well.
Elsewhere, signal intensity within the visualized cord is otherwise
normal.

Posterior Fossa, vertebral arteries, paraspinal tissues: Visualized
brain and posterior fossa within normal limits. Craniocervical
junction normal. Paraspinous soft tissues within normal limits.
Preserved flow voids seen within the vertebral arteries bilaterally.

Disc levels:

C2-C3: Small central to left paracentral disc protrusion with
annular fissure (series 5, image 4). Mild left facet hypertrophy. No
canal or foraminal stenosis.

C3-C4: Trace anterolisthesis. Small right paracentral disc
protrusion indents the right ventral thecal sac (series 5, image 8).
Right worse than left facet hypertrophy. No spinal stenosis. Mild
left C4 foraminal narrowing. Right neural foramina remains patent.

C4-C5: Trace anterolisthesis. Minimal disc bulge with right-sided
uncovertebral hypertrophy. Right greater than left facet
degeneration. No spinal stenosis. Mild right C5 foraminal stenosis.
Left neural foramen remains patent.

C5-C6: Trace retrolisthesis. Moderate intervertebral disc space
narrowing with diffuse disc osteophyte complex. Broad posterior
component flattens and partially effaces the ventral thecal sac.
Superimposed mild facet hypertrophy. Resultant mild spinal stenosis
without frank cord impingement. Severe left with moderate right C6
foraminal stenosis.

C6-C7: Diffuse right eccentric disc bulge with endplate and right
greater than left uncovertebral spurring. Associated small right
paracentral annular fissure. Flattening of the right ventral thecal
sac ventral thecal sac, greater on the right. No significant spinal
stenosis, although the ventral right C7 nerve root could be
affected. Mild to moderate right with mild left C7 foraminal
stenosis.

C7-T1: Trace anterolisthesis. Broad base right paracentral to
foraminal disc protrusion (series 6, image 24). Secondary flattening
of the right ventral thecal sac with mild cord flattening.
Associated small focus of chronic myelomalacia of as above.
Superimposed mild facet and ligament flavum hypertrophy. Mild spinal
stenosis. Moderate right with mild left C8 foraminal narrowing.

T1-2: Seen only on sagittal projection. Small central disc
protrusion minimally indents the ventral thecal sac. No significant
spinal stenosis. Foramina remain patent.
IMPRESSION: 1. Broad based right paracentral to foraminal disc protrusion at
C7-T1 with resultant mild canal and moderate right C8 foraminal
stenosis.
2. Right eccentric disc osteophyte complex at C6-7 with resultant
mild to moderate right C7 foraminal stenosis.
3. Multifactorial degenerative changes at C5-6 with resultant mild
canal, with severe left and moderate right C6 foraminal stenosis.
4. Mild spondylosis at C3-4 and C4-5 with resultant mild left C4 and
right C5 foraminal stenosis.
5. Small focus of chronic myelomalacia involving the right hemi cord
at the level of C7-T1. Cord is somewhat atrophic at this level.

## 2021-10-24 IMAGING — MR MR SHOULDER*R* W/O CM
5 series · 39 of 40 positions shown · non-contrast
Comparison: Right shoulder radiographs [DATE]

CLINICAL DATA: Neck and right shoulder pain.  Chronic.

EXAM:
MRI OF THE RIGHT SHOULDER WITHOUT CONTRAST
TECHNIQUE: Multiplanar, multisequence MR imaging of the shoulder was performed.
No intravenous contrast was administered.

[Series 3: T2 fat-sat · axial · 4.0mm · 0.55mm/px · z∈[-15,+94]mm · 11 of 25 slices shown (1 of 3)]
[im 1/25]
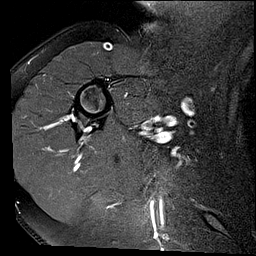
[im 3/25]
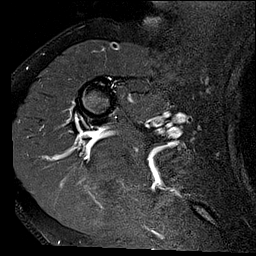
[im 5/25]
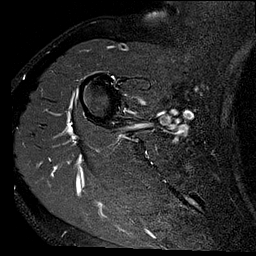
[im 8/25]
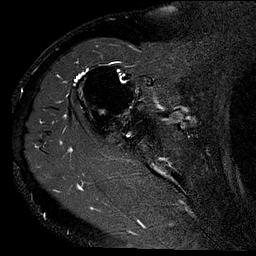
[im 10/25]
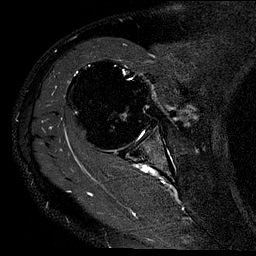
[im 13/25]
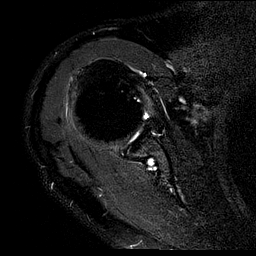
[im 15/25]
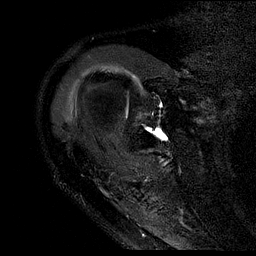
[im 17/25]
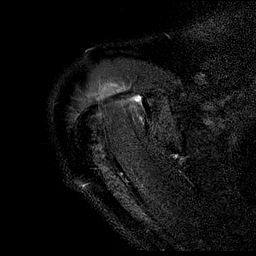
[im 20/25]
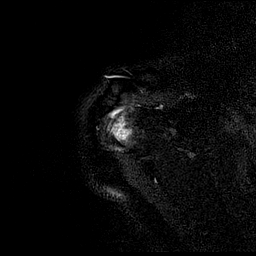
[im 22/25]
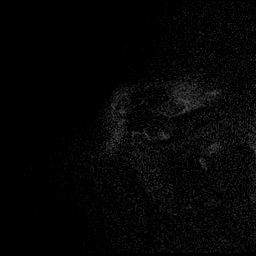
[im 25/25]
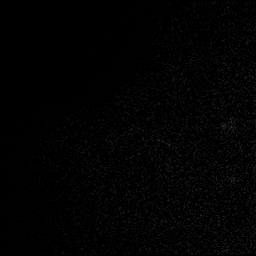

[Series 4: T2 fat-sat · oblique · 4.0mm · 0.59mm/px · 9 of 21 slices shown (2 of 3)]
[im 1/21]
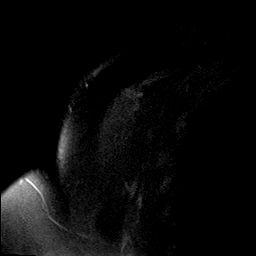
[im 3/21]
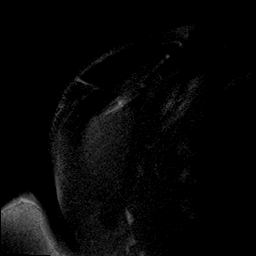
[im 6/21]
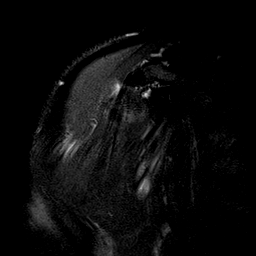
[im 8/21]
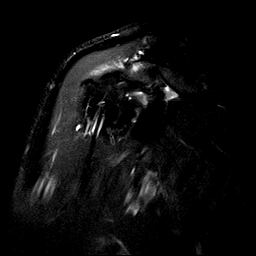
[im 11/21]
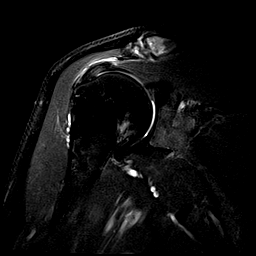
[im 13/21]
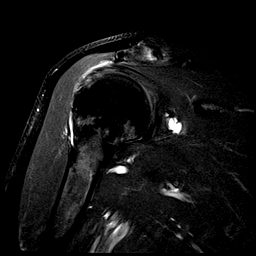
[im 16/21]
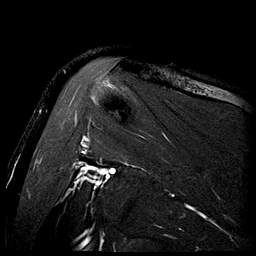
[im 18/21]
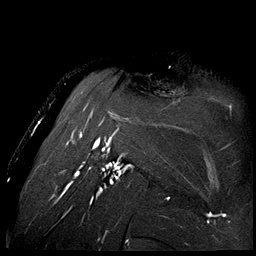
[im 21/21]
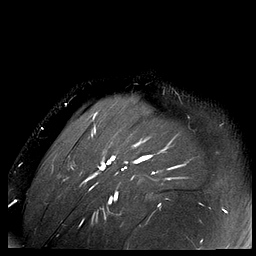

[Series 5: PD · oblique · 4.0mm · 0.29mm/px · 9 of 21 slices shown]
[im 1/21]
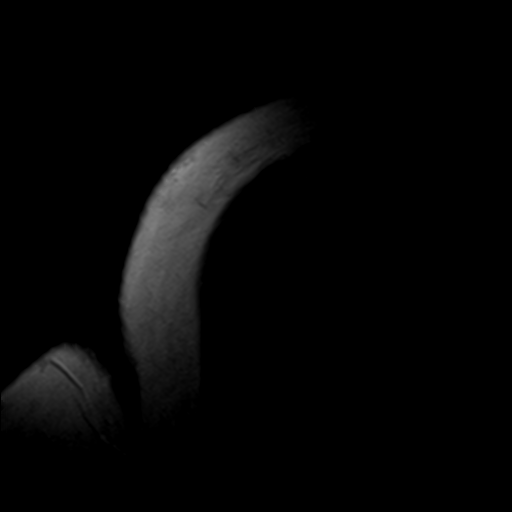
[im 3/21]
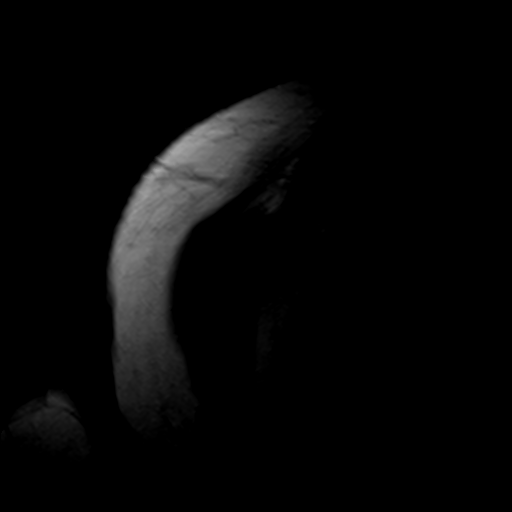
[im 6/21]
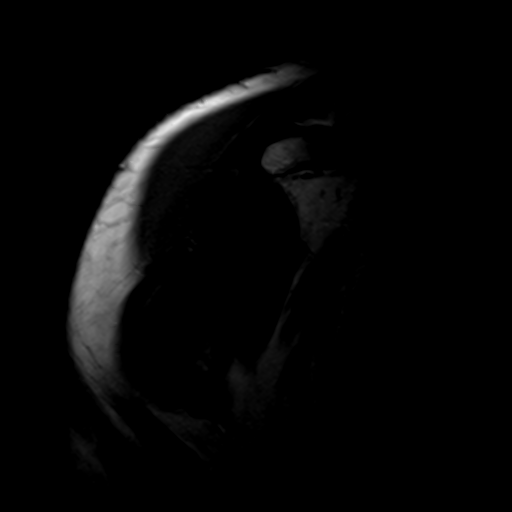
[im 8/21]
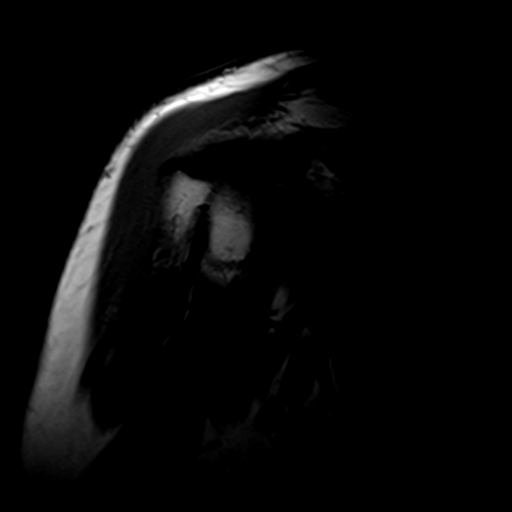
[im 11/21]
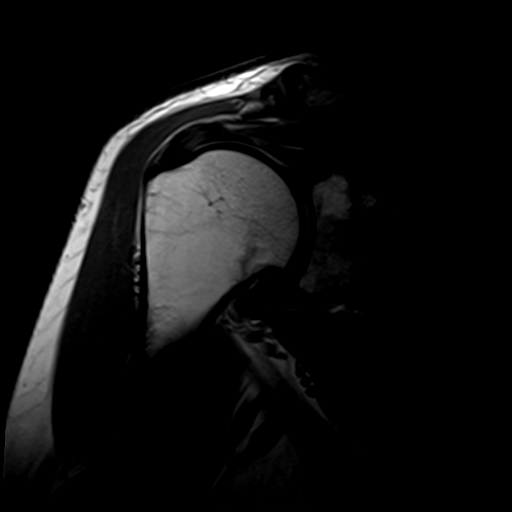
[im 13/21]
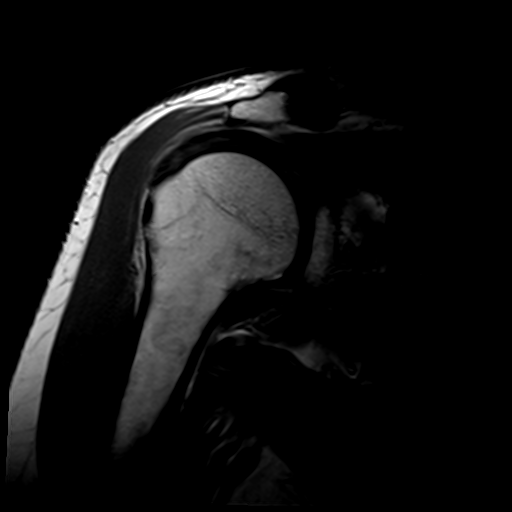
[im 16/21]
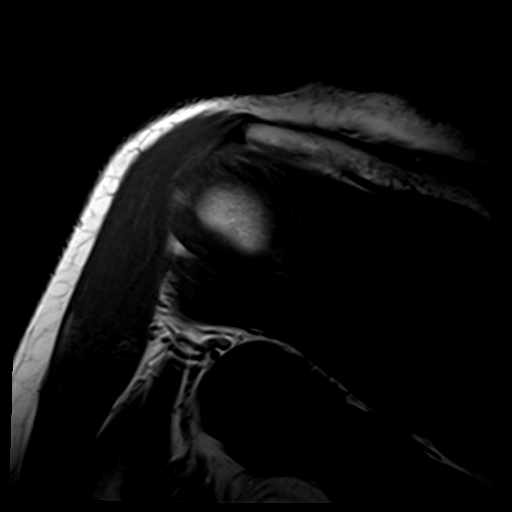
[im 18/21]
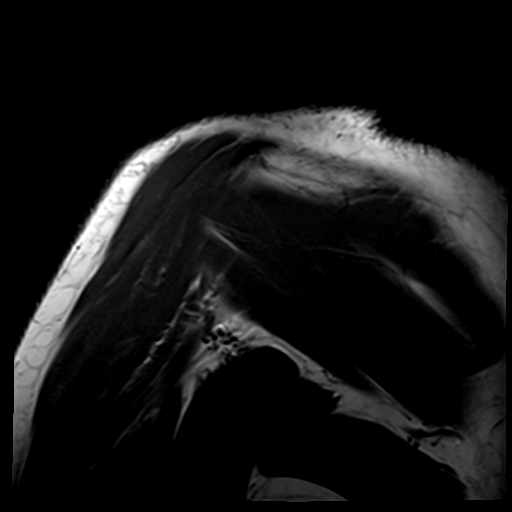
[im 21/21]
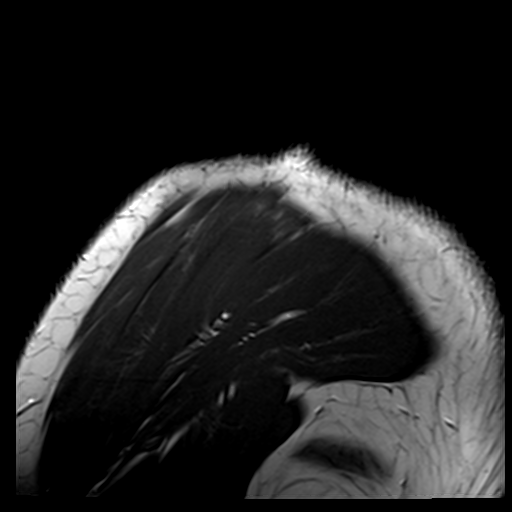

[Series 6: T2 fat-sat · oblique · 4.0mm · 0.59mm/px · 9 of 20 slices shown (3 of 3)]
[im 1/20]
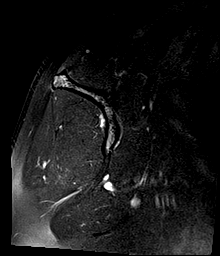
[im 3/20]
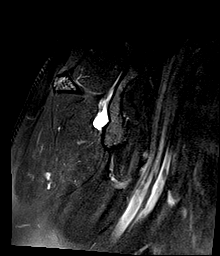
[im 5/20]
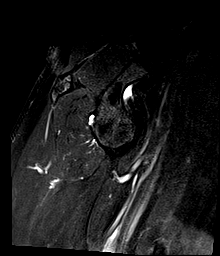
[im 8/20]
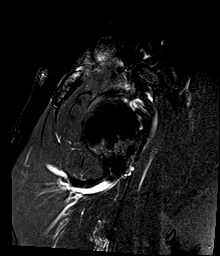
[im 10/20]
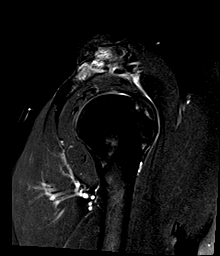
[im 12/20]
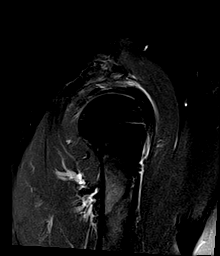
[im 15/20]
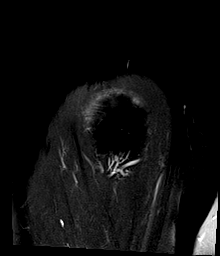
[im 17/20]
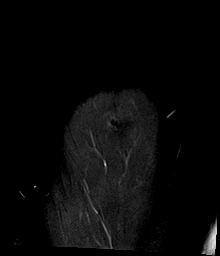
[im 20/20]
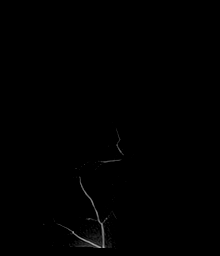

[Series 7: T1 · oblique · 4.0mm · 0.29mm/px · 1 of 5 slices shown]
[im 1/5]
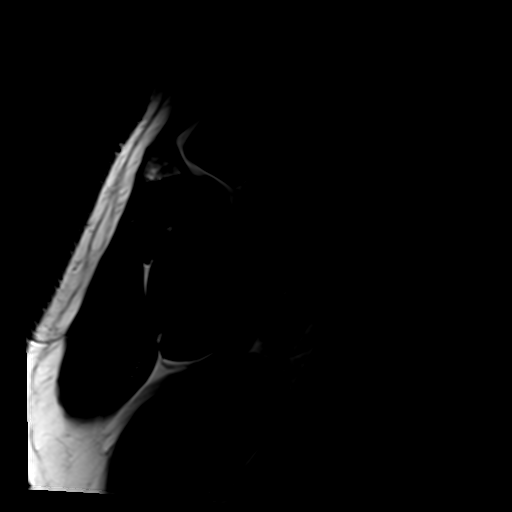

[39 of 40 positions shown; findings below may reference images not displayed]

FINDINGS: Rotator cuff: Mild mid to posterior infraspinatus intermediate T2
signal tendinosis. The supraspinatus, subscapularis, and teres minor
are intact.

Muscles: No rotator cuff muscle atrophy, fatty infiltration, or
edema.

Biceps long head: The intra-articular long head of the biceps tendon
is intact.

Acromioclavicular Joint: There are mild-to-moderate degenerative
changes of the acromioclavicular joint including joint space
narrowing, subchondral marrow edema, and peripheral osteophytosis.
Type II acromion. Mild subacromial/subdeltoid bursitis.

Glenohumeral Joint: Mild thinning of the glenoid and humeral head
cartilage.

Labrum: Lack of intra-articular fluid limits evaluation of the
glenoid labrum. Nevertheless, there is mildly increased T2 signal in
the region of the mid to superior glenoid labrum (axial series 3
images 12 through 15, and there is lobular decreased T1 increased T2
signal that appears to be extending from this region into the
posterior spinoglenoid notch may represent multiple small
multiloculated parameniscal cysts (axial images 14 through 16).
There also appear to be normal increased T2 signal blood vessels
running into this region of suspected paralabral cysts, and some of
the increased T2 signal may represent some dilated veins.

Bones:  No acute fracture.

Other: None.
IMPRESSION: :
IMPRESSION: 1. Mild mid to posterior infraspinatus tendinosis. No rotator cuff
tear.
2. Mild-to-moderate degenerative changes of the acromioclavicular
joint.
3. Mild glenohumeral cartilage thinning.
4. Within the limitations of lack of intra-articular fluid, there is
mildly increased fluid sensitive signal within the posterosuperior
glenoid labrum, possibly representing degenerative change and/or a
tear. There is multilobular increased T2 signal that extends from
the border of the posterosuperior glenoid labrum along the posterior
aspect of the glenoid and spinoglenoid notch, suspicious for
paralabral cysts. However, this increased T2 signal also extends
into the region of normal appearing blood vessels coursing through
this region adjacent to the posterosuperior glenoid, and at least a
component of the increased T2 signal structures may represent some
dilated veins.

## 2021-11-03 ENCOUNTER — Encounter: Payer: Self-pay | Admitting: Physical Therapy

## 2021-11-03 ENCOUNTER — Ambulatory Visit: Payer: Managed Care, Other (non HMO) | Attending: General Surgery | Admitting: Physical Therapy

## 2021-11-03 DIAGNOSIS — M6281 Muscle weakness (generalized): Secondary | ICD-10-CM | POA: Diagnosis present

## 2021-11-03 DIAGNOSIS — R293 Abnormal posture: Secondary | ICD-10-CM | POA: Diagnosis present

## 2021-11-03 NOTE — Therapy (Signed)
?OUTPATIENT PHYSICAL THERAPY TREATMENT NOTE ? ? ?Patient Name: Ariana Walsh ?MRN: [id number] ?DOB:1971-01-24, 51 y.o., female ?Today's Date: 11/03/2021 ? ?PCP: [name], MD ?REFERRING PROVIDER: [name], MD ? ?END OF SESSION:  ? PT End of Session - 11/03/21 1150   ? ? Visit Number 2   ? Date for PT Re-Evaluation 12/24/21   ? Authorization Type cigna   ? PT Start Time 1147   ? PT Stop Time 1226   ? PT Time Calculation (min) 39 min   ? Activity Tolerance Patient tolerated treatment well   ? Behavior During Therapy [facility] for tasks assessed/performed   ? ?  ?  ? ?  ? ? ?Past Medical History:  ?Diagnosis Date  ? DJD (degenerative joint disease), lumbar 11/22/2019  ? Xray 11/2019  ? Exercise-induced asthma 09/21/2018  ? Allergy induced also  ? Hernia, umbilical   ? Osteoarthritis, hip, bilateral 11/22/2019  ? xrays 11/2019  ? Vitamin B12 deficiency 11/20/2019  ? ?Past Surgical History:  ?Procedure Laterality Date  ? DENTAL SURGERY    ? HERNIA REPAIR    ? IVF    ? egg collection  ? ?Patient Active Problem List  ? Diagnosis Date Noted  ? Osteoarthritis of spine with radiculopathy, cervical region 08/28/2021  ? Osteoarthritis of right acromioclavicular joint 08/28/2021  ? Rotator cuff arthropathy of right shoulder 08/28/2021  ? Neuroforaminal stenosis of cervical spine 08/28/2021  ? Family history of coronary artery disease 08/13/2021  ? Obesity (BMI 30-39.9) 01/26/2021  ? Keratosis pilaris 01/26/2021  ? Extrinsic asthma 06/02/2020  ? DJD (degenerative joint disease), lumbar 11/22/2019  ? Osteoarthritis, hip, bilateral 11/22/2019  ? Vitamin B12 deficiency 11/20/2019  ? Vegetarian 11/20/2019  ? OSA (obstructive sleep apnea) 10/04/2019  ? Exercise-induced asthma 09/21/2018  ? Perimenopausal symptoms 02/17/2018  ? Umbilical hernia 54/00/8676  ? Dystrophic nail 12/30/2016  ? ? ?REFERRING DIAG:  ?Z87.19 (ICD-10-CM) - Personal history of other diseases of the digestive system  ?Z98.890,Z87.19 (PPJ-09-TO) - H/O  umbilical hernia repair  ? ? ?THERAPY DIAG:  ?Muscle weakness (generalized) ? ?Abnormal posture ? ?PERTINENT HISTORY: Hernia since 2017 and surgery in December 2022 ? ?PRECAUTIONS: None ? ?SUBJECTIVE: I only had one time when I felt the mesh catch.  Overall, it is doing well. ? ?PAIN:  ?Are you having pain? No ? ? ?MUSCLE LENGTH: ?Hamstrings: Right 75 deg; Left 75 deg ?  ?  ?LUMBAR SPECIAL TESTS:  ?ASLR has instability with Lt LE ?  ?FUNCTIONAL TESTS:  ?SLS trendelenburg on Lt LE ?Squat normal ?  ?GAIT: ?  ?Comments: WFL ?  ?POSTURE:  ?Anterior pelvic tilt; rounded shoulders ?  ?LUMBARAROM/PROM ?  ?A/PROM A/PROM  ?10/01/2021  ?Flexion 80%  ?Extension    ?Right lateral flexion    ?Left lateral flexion    ?Right rotation    ?Left rotation    ? (Blank rows = not tested) ?  ?LE ROM: ?  ?Passive ROM Right ?10/01/2021 Left ?10/01/2021  ?Hip flexion      ?Hip extension      ?Hip abduction      ?Hip adduction      ?Hip internal rotation normal 90%  ?Hip external rotation normal 75%  ?Knee flexion      ?Knee extension      ?Ankle dorsiflexion      ?Ankle plantarflexion      ?Ankle inversion      ?Ankle eversion      ? (Blank rows = not  tested) ?  ?LE MMT: ?  ?MMT Right ?10/01/2021 Left ?10/01/2021  ?Hip flexion      ?Hip extension 4/5 4/5  ?Hip abduction 5/5 4/5  ?Hip adduction 4+/5 4/5  ?Hip internal rotation      ?Hip external rotation      ?Knee flexion      ?Knee extension      ?Ankle dorsiflexion      ?Ankle plantarflexion      ?Ankle inversion      ?Ankle eversion      ?  ?  ?(Blank rows = not tested) ?  ?      PALPATION: ?  General  - lumbar and thoracic erectors tight; umbilical incision scar tight and TTP; Lt transversus abdominus less active ?  ?              External Perineal Exam - able to contract and relax correctly ?              ?              Internal Pelvic Floor-  deferred ?  ?  ?  ?TODAY'S TREATMENT  ?  ?Treatment: 11/03/21 ?Exercises  ?Primal push up  ?Qped alt UE and LE - foam roller on back ?SLR 5x demo how  HEP is going ?Sidelying raises ? ?Manual ?Scar and abdominal massage and fascial release ? ? ?10/01/21 EVAL ; educated on scar massage; medbridge ASLR with abdominal bracing ?  ?  ?PATIENT EDUCATION:  ?Education details: Access Code: [email or web address] ? ?Person educated: Patient ?Education method: Explanation, Demonstration, Tactile cues, Verbal cues, and Handouts ?Education comprehension: verbalized understanding ?  ?  ?HOME EXERCISE PROGRAM: ?  ?Access Code: [email or web address] ?URL: [email or web address] ?Date: 11/03/2021 ?Prepared by: Jari Favre ? ?Exercises ?- Small Range Straight Leg Raise  - 1 x daily - 7 x weekly - 3 sets - 10 reps ?- Thoracic rotation bow and arrow stretch  - 1 x daily - 7 x weekly - 3 sets - 10 reps ?- Quadruped Alternating Leg Extensions  - 1 x daily - 7 x weekly - 3 sets - 10 reps ?- Sidelying Hip Abduction  - 1 x daily - 7 x weekly - 3 sets - 10 reps ?- Primal Push Up with Foam Roll  - 1 x daily - 7 x weekly - 3 sets - 10 reps ?  ?  ?ASSESSMENT: ?  ?CLINICAL IMPRESSION: ?Today was initial F/u no goals met as of today.  Pt did well engageing her core  and demonstrateing good technique with exercises. Pt scar tissue seems a little less taut that at the evaluation and is doing well with scar massage at home.  She feels like she does the massage differently on herself however, because it feels odd and she doesn't like the feeling of the tissues.  Pt will benefit from skilled PT to address all above mentioned impairments and progress core strength in order to safely return to maximum function ?  ?  ?OBJECTIVE IMPAIRMENTS decreased activity tolerance, decreased coordination, decreased endurance, decreased strength, increased fascial restrictions, and pain.  ?  ?ACTIVITY LIMITATIONS community activity and exercise .  ?  ?PERSONAL FACTORS 1-2 comorbidities: difficult delivery; hernia repair  are also affecting patient's functional outcome.  ?  ?  ?REHAB POTENTIAL: Excellent ?  ?CLINICAL  DECISION MAKING: Stable/uncomplicated ?  ?EVALUATION COMPLEXITY: Low ?  ?  ?GOALS: ?Goals reviewed with patient? Yes ?  ?SHORT TERM GOALS: Target date: 10/29/2021 ?  ?  Ind with initial HEP ?Baseline:  ?Goal status: INITIAL ?  ?2.  Pt will report 25% less pain around the incision due to improved soft tissue mobility ?Baseline:  ?Goal status: INITIAL ?  ?LONG TERM GOALS: Target date: 12/24/2021 ?  ?Pt will be independent with advanced HEP to maintain improvements made throughout therapy ?  ?Baseline:  ?Goal status: INITIAL ?  ?2.  Pt will report 80% reduction of pain due to improvements in posture, strength, and muscle length ?  ?Baseline:  ?Goal status: INITIAL ?  ?3.  Pt will be able to confidently and safely return to yoga due to improved core strength ?Baseline:  ?Goal status: INITIAL ?  ?4.  Pt will have 5/5 MMT of bil hip strength for improved stability of trunk in single leg activities ?Baseline:  ?Goal status: INITIAL ?  ?  ?PLAN: ?PT FREQUENCY: 1x/week ?  ?PT DURATION: 12 weeks ?  ?PLANNED INTERVENTIONS: Therapeutic exercises, Therapeutic activity, Neuromuscular re-education, Balance training, Gait training, Patient/Family education, Joint mobilization, Dry Needling, Electrical stimulation, Cryotherapy, Moist heat, scar mobilization, Taping, Biofeedback, and Manual therapy ?  ?PLAN FOR NEXT SESSION: progress core strength with rotation and pallof series; lifting and lat pull down as tolerated; abdominal fascial assess and release ? ? ? ?Jule Ser, PT ?11/03/2021, 12:29 PM ? ?  ? ?

## 2021-11-09 ENCOUNTER — Encounter: Payer: Managed Care, Other (non HMO) | Admitting: Physical Therapy

## 2021-11-10 ENCOUNTER — Encounter: Payer: Self-pay | Admitting: Family Medicine

## 2021-11-11 ENCOUNTER — Encounter (HOSPITAL_BASED_OUTPATIENT_CLINIC_OR_DEPARTMENT_OTHER): Payer: Self-pay | Admitting: Cardiology

## 2021-11-11 ENCOUNTER — Ambulatory Visit (INDEPENDENT_AMBULATORY_CARE_PROVIDER_SITE_OTHER): Payer: Managed Care, Other (non HMO) | Admitting: Cardiology

## 2021-11-11 VITALS — BP 132/80 | HR 57 | Ht 66.0 in | Wt 171.5 lb

## 2021-11-11 DIAGNOSIS — R002 Palpitations: Secondary | ICD-10-CM | POA: Diagnosis not present

## 2021-11-11 DIAGNOSIS — K760 Fatty (change of) liver, not elsewhere classified: Secondary | ICD-10-CM

## 2021-11-11 DIAGNOSIS — Z7189 Other specified counseling: Secondary | ICD-10-CM | POA: Diagnosis not present

## 2021-11-11 DIAGNOSIS — Z8616 Personal history of COVID-19: Secondary | ICD-10-CM | POA: Diagnosis not present

## 2021-11-11 DIAGNOSIS — Z8249 Family history of ischemic heart disease and other diseases of the circulatory system: Secondary | ICD-10-CM | POA: Diagnosis not present

## 2021-11-11 NOTE — Progress Notes (Signed)
?Cardiology Office Note:   ? ?Date:  11/11/2021  ? ?ID:  Ariana Walsh, DOB [birth year], MRN [id number] ? ?PCP:  [name], MD  ?Cardiologist:  Buford Dresser, MD ? ?Referring MD: [name], MD  ? ?CC: follow up ? ?History of Present Illness:   ? ?Ariana Walsh is a 51 y.o. female with a hx of asthma, osteoarthritis, and DJD (lumbar), who is seen for follow up today. I initially met her 05/25/21 as a new consult at the request of [name], MD for the evaluation and management of palpitations and pre-op clearance for hernia repair. ? ?Pertinent past cardiac history:  ?Prior cardiac workup: Monitor 02/2021-03/2021 ?History of valve disease: None ?History of CAD/PAD/CVA/TIA: None ?History of heart failure: None ?History of arrhythmia: presented to the ED 03/02/2021 with tachycardia. At that time her palpitations had widely varying durations and occurred several times a day. Since then her palpitations slowly resolved. ? ?Family History: Father had CABG x4 in his 45s. "Everyone in her family" has CAD, heart attack, or stroke. ? ?CT from 2022 [facility], record scanned in) noted diffuse fatty liver infiltration. ? ?Today: ?Had a piano fall on her at work yesterday, went to the nurse several hours later and blood pressure was elevated.  ? ?Wants to make sure we get a baseline of her heart health given her family history. Discussed calcium score at length. Also reviewed prior report of fatty liver on CT. ? ?Tachycardia has been intermittent. Last major episode late November/early December lasted an hour. Three less intense episodes since Jan 1 this year. Had an episode last week, has a smart ring fitness app but not captured rate. Had an episode earlier that lasted over a day. Reviewed [facility].  ? ?Denies chest pain, shortness of breath at rest or with normal exertion. No PND, orthopnea, LE edema or unexpected weight gain. No syncope. ? ?Past Medical History:  ?Diagnosis Date  ?  DJD (degenerative joint disease), lumbar 11/22/2019  ? Xray 11/2019  ? Exercise-induced asthma 09/21/2018  ? Allergy induced also  ? Hernia, umbilical   ? Osteoarthritis, hip, bilateral 11/22/2019  ? xrays 11/2019  ? Vitamin B12 deficiency 11/20/2019  ? ? ?Past Surgical History:  ?Procedure Laterality Date  ? DENTAL SURGERY    ? HERNIA REPAIR    ? IVF    ? egg collection  ? ? ?Current Medications: ?Current Outpatient Medications on File Prior to Visit  ?Medication Sig  ? Eflornithine HCl (VANIQA) 13.9 % cream Apply to area twice a day  ? montelukast (SINGULAIR) 10 MG tablet TAKE ONE TABLET BY MOUTH AT BEDTIME  ? Peak Flow Meter DEVI Use as needed to check your breathing. Log your values  ? PROAIR HFA 108 (90 Base) MCG/ACT inhaler USE 2 PUFFS EVERY 4 HOURS AS NEEDED FOR COUGH OR SHORTNESS OF BREATH.  ? ?No current facility-administered medications on file prior to visit.  ?  ? ?Allergies:   Latex  ? ?Social History  ? ?Tobacco Use  ? Smoking status: Never  ? Smokeless tobacco: Never  ?Vaping Use  ? Vaping Use: Never used  ?Substance Use Topics  ? Alcohol use: Not Currently  ?  Alcohol/week: 0.0 - 3.0 standard drinks  ? Drug use: No  ? ? ?Family History: ?family history includes Arthritis in her father; Breast cancer in her cousin and mother; Cancer (age of onset: 86) in her father; Diabetes in her father; Endometrial cancer in her mother; Healthy in her  son; Heart disease in her father; Hypertension in her father and mother; Mental illness in her brother and father. There is no history of Colon cancer. ? ?ROS:   ?Please see the history of present illness.  Additional pertinent ROS otherwise unremarkable. ? ?EKGs/Labs/Other Studies Reviewed:   ? ?The following studies were reviewed today: ? ?Monitor 02/2021-03/2021: ?Patch Wear Time:  3 days and 0 hours  ?  ?Predominant rhythm was sinus rhythm ?<1% ventricular and supraventricular ectopy ?No atrial fibrillation noted ?9 SVT episodes, all less than 10 beats ?Symptoms/triggered  events associated with sinus rhythm ? ?EKG:  EKG is personally reviewed.   ?11/11/21: sinus bradycardia at 57 bpm, sinus arrhythmia (normal) ?05/25/2021: sinus bradycardia at 56 bpm ? ?Recent Labs: ?01/26/2021: ALT 22 ?03/03/2021: BUN 12; Creatinine, Ser 0.76; Hemoglobin 13.3; Platelets 248.0; Potassium 4.0; Sodium 140; TSH 0.86  ? ?Recent Lipid Panel ?   ?Component Value Date/Time  ? CHOL 154 01/26/2021 1401  ? TRIG 156.0 (H) 01/26/2021 1401  ? HDL 44.10 01/26/2021 1401  ? CHOLHDL 3 01/26/2021 1401  ? VLDL 31.2 01/26/2021 1401  ? [place] 79 01/26/2021 1401  ? ? ?Physical Exam:   ? ?VS:  BP 132/80 (BP Location: Left Arm, Patient Position: Sitting, Cuff Size: Normal)   Pulse (!) 57   Ht 5' 6" (1.676 m)   Wt 171 lb 8 oz (77.8 kg)   BMI 27.68 kg/m?    ? ?Wt Readings from Last 3 Encounters:  ?11/11/21 171 lb 8 oz (77.8 kg)  ?08/28/21 168 lb 9.6 oz (76.5 kg)  ?08/12/21 172 lb 6.4 oz (78.2 kg)  ?  ?GEN: Well nourished, well developed in no acute distress ?HEENT: Normal, moist mucous membranes ?NECK: No JVD ?CARDIAC: regular rhythm, normal S1 and S2, no rubs or gallops. No murmur. ?VASCULAR: Radial and DP pulses 2+ bilaterally. No carotid bruits ?RESPIRATORY:  Clear to auscultation without rales, wheezing or rhonchi  ?ABDOMEN: Soft, non-tender, non-distended ?MUSCULOSKELETAL:  Ambulates independently ?SKIN: Warm and dry, no edema ?NEUROLOGIC:  Alert and oriented x 3. No focal neuro deficits noted. ?PSYCHIATRIC:  Normal affect    ? ?ASSESSMENT:   ? ?1. Family history of heart disease   ?2. Palpitations   ?3. Personal history of COVID-19   ?4. Cardiac risk counseling   ?5. Counseling on health promotion and disease prevention   ?6. Fatty infiltration of liver   ? ? ?PLAN:   ? ?Palpitations, history of Covid infection ?-palpitations occasional ?-discussed [place], she will look into it ?-no high risk features previously ? ?Family history of heart disease ?-we reviewed the pros/cons of calcium scores. A cardiac CT scan  for coronary calcium is a non-invasive way of obtaining information about the presence, location and extent of calcified plaque in the coronary arteries--the vessels that supply oxygen-containing blood to the heart muscle. Calcified plaque results when there is a build-up of fat and other substances under the inner layer of the artery. This material can calcify which signals the presence of atherosclerosis, a disease of the vessel wall, also called coronary artery disease.  People with this disease have an increased risk for heart attacks. In addition, over time, progression of plaque build up (CAD) can narrow the arteries or even close off blood flow to the heart. Because calcium is a marker of CAD, the amount of calcium detected on a cardiac CT scan is a helpful prognostic tool.  ?-we reviewed the charts together which show the relationship between calcium score and 15 year  all cause mortality ?-after shared decision making, will proceed with coronary calcium score. They understand this is an out of pocket/self pay test currently costing $99.  ? ?History of fatty liver on CT scan.  ?-discussed recommendations for management, risk management ? ?Intermittently elevated blood pressure ?-we discussed normal triggers that can raise blood pressure short term and that long term average is the goal of management ?-continue to monitor ? ?Cardiac risk counseling and prevention recommendations: ?-recommend heart healthy/Mediterranean diet, with whole grains, fruits, vegetable, fish, lean meats, nuts, and olive oil. Limit salt. ?-recommend moderate walking, 3-5 times/week for 30-50 minutes each session. Aim for at least 150 minutes.week. Goal should be pace of 3 miles/hours, or walking 1.5 miles in 30 minutes ?-recommend avoidance of tobacco products. Avoid excess alcohol. ?-ASCVD risk score: ?The 10-year ASCVD risk score (Arnett DK, et al., 2019) is: 1.5% ?  Values used to calculate the score: ?    Age: 31 years ?    Sex:  Female ?    Is Non-Hispanic African American: No ?    Diabetic: No ?    Tobacco smoker: No ?    Systolic Blood Pressure: 426 mmHg ?    Is BP treated: No ?    HDL Cholesterol: 44.1 mg/dL ?    Total Cholesterol

## 2021-11-11 NOTE — Patient Instructions (Addendum)
Medication Instructions:  ?Your physician recommends that you continue on your current medications as directed. Please refer to the Current Medication list given to you today.  ? ?*If you need a refill on your cardiac medications before your next appointment, please call your pharmacy* ? ? ?Lab Work: ?NONE ?If you have labs (blood work) drawn today and your tests are completely normal, you will receive your results only by: ?MyChart Message (if you have MyChart) OR ?A paper copy in the mail ?If you have any lab test that is abnormal or we need to change your treatment, we will call you to review the results. ? ?Testing/Procedures: ?CALCIUM SCORE, THIS WILL COST YOU $99 OUT OF POCKET  ?THE OFFICE WILL CALL YOU TO SCHEDULE  ? ?Follow-Up: ?At [facility], you and your health needs are our priority.  As part of our continuing mission to provide you with exceptional heart care, we have created designated Provider Care Teams.  These Care Teams include your primary Cardiologist (physician) and Advanced Practice Providers (APPs -  Physician Assistants and Nurse Practitioners) who all work together to provide you with the care you need, when you need it. ? ?We recommend signing up for the patient portal called "MyChart".  Sign up information is provided on this After Visit Summary.  MyChart is used to connect with patients for Virtual Visits (Telemedicine).  Patients are able to view lab/test results, encounter notes, upcoming appointments, etc.  Non-urgent messages can be sent to your provider as well.   ?To learn more about what you can do with MyChart, go to [email or web address].   ? ?Your next appointment:   ?6 month(s) ? ?The format for your next appointment:   ?In Person ? ?Provider:   ?Buford Dresser, MD  ? ?Other Instructions ? ?Look into [employer] to track your rhythm when you are having symptoms. ?

## 2021-11-19 ENCOUNTER — Ambulatory Visit: Payer: Managed Care, Other (non HMO) | Attending: General Surgery | Admitting: Physical Therapy

## 2021-11-19 ENCOUNTER — Encounter: Payer: Self-pay | Admitting: Physical Therapy

## 2021-11-19 DIAGNOSIS — R293 Abnormal posture: Secondary | ICD-10-CM | POA: Insufficient documentation

## 2021-11-19 DIAGNOSIS — M6281 Muscle weakness (generalized): Secondary | ICD-10-CM | POA: Insufficient documentation

## 2021-11-19 NOTE — Therapy (Signed)
?OUTPATIENT PHYSICAL THERAPY TREATMENT NOTE ? ? ?Patient Name: Ariana Walsh ?MRN: [id number] ?DOB:10/20/1970, 51 y.o., female ?Today's Date: 11/19/2021 ? ?PCP: [name], MD ?REFERRING PROVIDER: [name], MD ? ?END OF SESSION:  ? PT End of Session - 11/19/21 1150   ? ? Visit Number 3   ? Date for PT Re-Evaluation 12/24/21   ? Authorization Type cigna   ? PT Start Time 1148   ? PT Stop Time 1228   ? PT Time Calculation (min) 40 min   ? Activity Tolerance Patient tolerated treatment well   ? Behavior During Therapy [facility] for tasks assessed/performed   ? ?  ?  ? ?  ? ? ? ?Past Medical History:  ?Diagnosis Date  ? DJD (degenerative joint disease), lumbar 11/22/2019  ? Xray 11/2019  ? Exercise-induced asthma 09/21/2018  ? Allergy induced also  ? Hernia, umbilical   ? Osteoarthritis, hip, bilateral 11/22/2019  ? xrays 11/2019  ? Vitamin B12 deficiency 11/20/2019  ? ?Past Surgical History:  ?Procedure Laterality Date  ? DENTAL SURGERY    ? HERNIA REPAIR    ? IVF    ? egg collection  ? ?Patient Active Problem List  ? Diagnosis Date Noted  ? Fatty infiltration of liver 11/11/2021  ? Personal history of COVID-19 11/11/2021  ? Palpitations 11/11/2021  ? Osteoarthritis of spine with radiculopathy, cervical region 08/28/2021  ? Osteoarthritis of right acromioclavicular joint 08/28/2021  ? Rotator cuff arthropathy of right shoulder 08/28/2021  ? Neuroforaminal stenosis of cervical spine 08/28/2021  ? Family history of coronary artery disease 08/13/2021  ? Obesity (BMI 30-39.9) 01/26/2021  ? Keratosis pilaris 01/26/2021  ? Extrinsic asthma 06/02/2020  ? DJD (degenerative joint disease), lumbar 11/22/2019  ? Osteoarthritis, hip, bilateral 11/22/2019  ? Vitamin B12 deficiency 11/20/2019  ? Vegetarian 11/20/2019  ? OSA (obstructive sleep apnea) 10/04/2019  ? Exercise-induced asthma 09/21/2018  ? Perimenopausal symptoms 02/17/2018  ? Umbilical hernia 12/30/2016  ? Dystrophic nail 12/30/2016  ? ? ?REFERRING DIAG:  ?Z87.19  (ICD-10-CM) - Personal history of other diseases of the digestive system  ?Z98.890,Z87.19 (ICD-10-CM) - H/O umbilical hernia repair  ? ? ?THERAPY DIAG:  ?Muscle weakness (generalized) ? ?Abnormal posture ? ?PERTINENT HISTORY: Hernia since 2017 and surgery in December 2022 ? ?PRECAUTIONS: None ? ?SUBJECTIVE: I am having more issues with my neck and shoulder and the primal push up is bothering a little.  I haven't had any mesh related sensation ? ?PAIN:  ?Are you having pain? No ? ? ?MUSCLE LENGTH: ?Hamstrings: Right 75 deg; Left 75 deg ?  ?  ?LUMBAR SPECIAL TESTS:  ?ASLR has instability with Lt LE ?  ?FUNCTIONAL TESTS:  ?SLS trendelenburg on Lt LE ?Squat normal ?  ?GAIT: ?  ?Comments: WFL ?  ?POSTURE:  ?Anterior pelvic tilt; rounded shoulders ?  ?LUMBARAROM/PROM ?  ?A/PROM A/PROM  ?10/01/2021  ?Flexion 80%  ?Extension    ?Right lateral flexion    ?Left lateral flexion    ?Right rotation    ?Left rotation    ? (Blank rows = not tested) ?  ?LE ROM: ?  ?Passive ROM Right ?10/01/2021 Left ?10/01/2021  ?Hip flexion      ?Hip extension      ?Hip abduction      ?Hip adduction      ?Hip internal rotation normal 90%  ?Hip external rotation normal 75%  ?Knee flexion      ?Knee extension      ?Ankle dorsiflexion      ?  Ankle plantarflexion      ?Ankle inversion      ?Ankle eversion      ? (Blank rows = not tested) ?  ?LE MMT: ?  ?MMT Right ?10/01/2021 Left ?10/01/2021  ?Hip flexion      ?Hip extension 4/5 4/5  ?Hip abduction 5/5 4/5  ?Hip adduction 4+/5 4/5  ?Hip internal rotation      ?Hip external rotation      ?Knee flexion      ?Knee extension      ?Ankle dorsiflexion      ?Ankle plantarflexion      ?Ankle inversion      ?Ankle eversion      ?  ?  ?(Blank rows = not tested) ?  ?      PALPATION: ?  General  - lumbar and thoracic erectors tight; umbilical incision scar tight and TTP; Lt transversus abdominus less active ?  ?              External Perineal Exam - able to contract and relax correctly ?              ?               Internal Pelvic Floor-  deferred ?  ?  ?  ?TODAY'S TREATMENT  ?Treatment: 11/19/21 ?Exercises  ?Woodpecker - 10x each side ?Side stepping blue loop 3 x across mirror ?Sidelying hip abduction toe fwd and toe down ?Standing blue loop glute min and glutes ?Leaning on chair hip ext ? ? ?Treatment: 11/03/21 ?Exercises  ?Primal push up  ?Qped alt UE and LE - foam roller on back ?SLR 5x demo how HEP is going ?Sidelying raises ? ?Manual ?Scar and abdominal massage and fascial release ? ? ?10/01/21 EVAL ; educated on scar massage; medbridge ASLR with abdominal bracing ?  ?  ?PATIENT EDUCATION:  ?Education details: Access Code: [email or web address] ? ?Person educated: Patient ?Education method: Explanation, Demonstration, Tactile cues, Verbal cues, and Handouts ?Education comprehension: verbalized understanding ?  ?  ?HOME EXERCISE PROGRAM: ?  ?Access Code: [email or web address] ?URL: [email or web address] ?Date: 11/03/2021 ?Prepared by: Dwana CurdJacqueline Loyed Wilmes ? ?Exercises ?- Small Range Straight Leg Raise  - 1 x daily - 7 x weekly - 3 sets - 10 reps ?- Thoracic rotation bow and arrow stretch  - 1 x daily - 7 x weekly - 3 sets - 10 reps ?- Quadruped Alternating Leg Extensions  - 1 x daily - 7 x weekly - 3 sets - 10 reps ?- Sidelying Hip Abduction  - 1 x daily - 7 x weekly - 3 sets - 10 reps ?- Primal Push Up with Foam Roll  - 1 x daily - 7 x weekly - 3 sets - 10 reps ?  ?  ?ASSESSMENT: ?  ?CLINICAL IMPRESSION: ?Pt has been feeling better.  Pt was able to progress strength.  She needed a lot of cues with hip stability in standing and using mirror for cues helped as well.  Pt will benefit from skilled PT to address all above mentioned impairments and progress core strength in order to safely return to maximum function. ?  ?  ?OBJECTIVE IMPAIRMENTS decreased activity tolerance, decreased coordination, decreased endurance, decreased strength, increased fascial restrictions, and pain.  ?  ?ACTIVITY LIMITATIONS community activity and exercise .  ?   ?PERSONAL FACTORS 1-2 comorbidities: difficult delivery; hernia repair  are also affecting patient's functional outcome.  ?  ?  ?REHAB POTENTIAL: Excellent ?  ?CLINICAL DECISION MAKING:  Stable/uncomplicated ?  ?EVALUATION COMPLEXITY: Low ?  ?  ?GOALS: ?Goals reviewed with patient? Yes ?  ?SHORT TERM GOALS: Target date: 10/29/2021 ?  ?Ind with initial HEP ?Baseline:  ?Goal status: INITIAL ?  ?2.  Pt will report 25% less pain around the incision due to improved soft tissue mobility ?Baseline:  ?Goal status: INITIAL ?  ?LONG TERM GOALS: Target date: 12/24/2021 ?  ?Pt will be independent with advanced HEP to maintain improvements made throughout therapy ?  ?Baseline:  ?Goal status: INITIAL ?  ?2.  Pt will report 80% reduction of pain due to improvements in posture, strength, and muscle length ?  ?Baseline:  ?Goal status: INITIAL ?  ?3.  Pt will be able to confidently and safely return to yoga due to improved core strength ?Baseline:  ?Goal status: INITIAL ?  ?4.  Pt will have 5/5 MMT of bil hip strength for improved stability of trunk in single leg activities ?Baseline:  ?Goal status: INITIAL ?  ?  ?PLAN: ?PT FREQUENCY: 1x/week ?  ?PT DURATION: 12 weeks ?  ?PLANNED INTERVENTIONS: Therapeutic exercises, Therapeutic activity, Neuromuscular re-education, Balance training, Gait training, Patient/Family education, Joint mobilization, Dry Needling, Electrical stimulation, Cryotherapy, Moist heat, scar mobilization, Taping, Biofeedback, and Manual therapy ?  ?PLAN FOR NEXT SESSION: progress core strength with rotation and pallof series if shoulder tolerates; hip stability progression ? ? ? ?Junious Silk, PT ?11/19/2021, 11:54 AM ? ?  ? ?

## 2021-11-24 ENCOUNTER — Ambulatory Visit: Payer: Managed Care, Other (non HMO) | Admitting: Physical Therapy

## 2021-11-24 DIAGNOSIS — M6281 Muscle weakness (generalized): Secondary | ICD-10-CM | POA: Diagnosis not present

## 2021-11-24 DIAGNOSIS — R293 Abnormal posture: Secondary | ICD-10-CM

## 2021-11-24 NOTE — Therapy (Signed)
?OUTPATIENT PHYSICAL THERAPY TREATMENT NOTE ? ? ?Patient Name: Ariana Walsh ?MRN: [id number] ?DOB:[birth year], 51 y.o., female ?Today's Date: 11/24/2021 ? ?PCP: [name], MD ?REFERRING PROVIDER: [name], MD ? ?END OF SESSION:  ? PT End of Session - 11/24/21 1616   ? ? Visit Number 4   ? Date for PT Re-Evaluation 12/24/21   ? Authorization Type cigna   ? PT Start Time 1535   ? PT Stop Time 1615   ? PT Time Calculation (min) 40 min   ? Activity Tolerance Patient tolerated treatment well   ? Behavior During Therapy [facility] for tasks assessed/performed   ? ?  ?  ? ?  ? ? ? ? ?Past Medical History:  ?Diagnosis Date  ? DJD (degenerative joint disease), lumbar 11/22/2019  ? Xray 11/2019  ? Exercise-induced asthma 09/21/2018  ? Allergy induced also  ? Hernia, umbilical   ? Osteoarthritis, hip, bilateral 11/22/2019  ? xrays 11/2019  ? Vitamin B12 deficiency 11/20/2019  ? ?Past Surgical History:  ?Procedure Laterality Date  ? DENTAL SURGERY    ? HERNIA REPAIR    ? IVF    ? egg collection  ? ?Patient Active Problem List  ? Diagnosis Date Noted  ? Fatty infiltration of liver 11/11/2021  ? Personal history of COVID-19 11/11/2021  ? Palpitations 11/11/2021  ? Osteoarthritis of spine with radiculopathy, cervical region 08/28/2021  ? Osteoarthritis of right acromioclavicular joint 08/28/2021  ? Rotator cuff arthropathy of right shoulder 08/28/2021  ? Neuroforaminal stenosis of cervical spine 08/28/2021  ? Family history of coronary artery disease 08/13/2021  ? Obesity (BMI 30-39.9) 01/26/2021  ? Keratosis pilaris 01/26/2021  ? Extrinsic asthma 06/02/2020  ? DJD (degenerative joint disease), lumbar 11/22/2019  ? Osteoarthritis, hip, bilateral 11/22/2019  ? Vitamin B12 deficiency 11/20/2019  ? Vegetarian 11/20/2019  ? OSA (obstructive sleep apnea) 10/04/2019  ? Exercise-induced asthma 09/21/2018  ? Perimenopausal symptoms 02/17/2018  ? Umbilical hernia 34/19/3790  ? Dystrophic nail 12/30/2016  ? ? ?REFERRING DIAG:   ?Z87.19 (ICD-10-CM) - Personal history of other diseases of the digestive system  ?Z98.890,Z87.19 ([id number]) - H/O umbilical hernia repair  ? ? ?THERAPY DIAG:  ?Muscle weakness (generalized) ? ?Abnormal posture ? ?PERTINENT HISTORY: Hernia since 2017 and surgery in December 2022 ? ?PRECAUTIONS: None ? ?SUBJECTIVE: I am having more issues with my neck and shoulder and the primal push up is bothering a little.  I haven't had any mesh related sensation ? ?PAIN:  ?Are you having pain? No ? ? ?MUSCLE LENGTH: ?Hamstrings: Right 75 deg; Left 75 deg ?  ?  ?LUMBAR SPECIAL TESTS:  ?ASLR has instability with Lt LE ?  ?FUNCTIONAL TESTS:  ?SLS trendelenburg on Lt LE ?Squat normal ?  ?GAIT: ?  ?Comments: WFL ?  ?POSTURE:  ?Anterior pelvic tilt; rounded shoulders ?  ?LUMBARAROM/PROM ?  ?A/PROM A/PROM  ?10/01/2021  ?Flexion 80%  ?Extension    ?Right lateral flexion    ?Left lateral flexion    ?Right rotation    ?Left rotation    ? (Blank rows = not tested) ?  ?LE ROM: ?  ?Passive ROM Right ?10/01/2021 Left ?10/01/2021  ?Hip flexion      ?Hip extension      ?Hip abduction      ?Hip adduction      ?Hip internal rotation normal 90%  ?Hip external rotation normal 75%  ?Knee flexion      ?Knee extension      ?Ankle dorsiflexion      ?  Ankle plantarflexion      ?Ankle inversion      ?Ankle eversion      ? (Blank rows = not tested) ?  ?LE MMT: ?  ?MMT Right ?10/01/2021 Left ?10/01/2021  ?Hip flexion      ?Hip extension 4/5 4/5  ?Hip abduction 5/5 4/5  ?Hip adduction 4+/5 4/5  ?Hip internal rotation      ?Hip external rotation      ?Knee flexion      ?Knee extension      ?Ankle dorsiflexion      ?Ankle plantarflexion      ?Ankle inversion      ?Ankle eversion      ?  ?  ?(Blank rows = not tested) ?  ?      PALPATION: ?  General  - lumbar and thoracic erectors tight; umbilical incision scar tight and TTP; Lt transversus abdominus less active ?  ?              External Perineal Exam - able to contract and relax correctly ?              ?               Internal Pelvic Floor-  deferred ?  ?  ?  ?TODAY'S TREATMENT  ?Treatment: 11/24/21 ?Exercises  ?Woodpecker - 10x each side ?Side stepping blue loop 3 x across mirror ?Supine on foam roll - knee fallout, march, UE alt - 20x each ? ?Manual: ?Fascial release around the incision ? ?Treatment: 11/19/21 ?Exercises  ?Woodpecker - 10x each side ?Side stepping blue loop 3 x across mirror ?Sidelying hip abduction toe fwd and toe down ?Standing blue loop glute min and glutes ?Leaning on chair hip ext ? ? ?Treatment: 11/03/21 ?Exercises  ?Primal push up  ?Qped alt UE and LE - foam roller on back ?SLR 5x demo how HEP is going ?Sidelying raises ? ?Manual ?Scar and abdominal massage and fascial release ? ? ?  ?PATIENT EDUCATION:  ?Education details: Access Code: [email or web address] ? ?Person educated: Patient ?Education method: Explanation, Demonstration, Tactile cues, Verbal cues, and Handouts ?Education comprehension: verbalized understanding ?  ?  ?HOME EXERCISE PROGRAM: ?  ?Access Code: [email or web address] ?URL: [email or web address] ?Date: 11/03/2021 ?Prepared by: Jari Favre ? ?Exercises ?- Small Range Straight Leg Raise  - 1 x daily - 7 x weekly - 3 sets - 10 reps ?- Thoracic rotation bow and arrow stretch  - 1 x daily - 7 x weekly - 3 sets - 10 reps ?- Quadruped Alternating Leg Extensions  - 1 x daily - 7 x weekly - 3 sets - 10 reps ?- Sidelying Hip Abduction  - 1 x daily - 7 x weekly - 3 sets - 10 reps ?- Primal Push Up with Foam Roll  - 1 x daily - 7 x weekly - 3 sets - 10 reps ?  ?  ?ASSESSMENT: ?  ?CLINICAL IMPRESSION: ?Pt has been feeling 90% better.  There is still some fascial restriction around the incision.  Pt also demonstrates weakness in the core and imbalance with doing single leg.  Appears weaker on the Lt side when stabilizing with the left.  Pt will benefit from skilled PT to address all above mentioned impairments and progress core strength in order to safely return to maximum function. ?  ?  ?OBJECTIVE  IMPAIRMENTS decreased activity tolerance, decreased coordination, decreased endurance, decreased strength, increased fascial restrictions, and pain.  ?  ?ACTIVITY LIMITATIONS community activity  and exercise .  ?  ?PERSONAL FACTORS 1-2 comorbidities: difficult delivery; hernia repair  are also affecting patient's functional outcome.  ?  ?  ?REHAB POTENTIAL: Excellent ?  ?CLINICAL DECISION MAKING: Stable/uncomplicated ?  ?EVALUATION COMPLEXITY: Low ?  ?  ?GOALS: ?Goals reviewed with patient? Yes ?  ?SHORT TERM GOALS: Target date: 10/29/2021 ?  ?Ind with initial HEP ?Baseline:  ?Goal status: met ?  ?2.  Pt will report 25% less pain around the incision due to improved soft tissue mobility ?Baseline:  ?Goal status: met ?  ?LONG TERM GOALS: Target date: 12/24/2021 ?  ?Pt will be independent with advanced HEP to maintain improvements made throughout therapy ? ?Baseline:  ?Goal status: ongoing ?  ?2.  Pt will report 80% reduction of pain due to improvements in posture, strength, and muscle length ?  ?Baseline: 90% 11/24/21 ? ?Goal status: met ?  ?3.  Pt will be able to confidently and safely return to yoga due to improved core strength ?Baseline:  ?Goal status: INITIAL ?  ?4.  Pt will have 5/5 MMT of bil hip strength for improved stability of trunk in single leg activities ?Baseline:  ?Goal status: INITIAL ?  ?  ?PLAN: ?PT FREQUENCY: 1x/week ?  ?PT DURATION: 12 weeks ?  ?PLANNED INTERVENTIONS: Therapeutic exercises, Therapeutic activity, Neuromuscular re-education, Balance training, Gait training, Patient/Family education, Joint mobilization, Dry Needling, Electrical stimulation, Cryotherapy, Moist heat, scar mobilization, Taping, Biofeedback, and Manual therapy ?  ?PLAN FOR NEXT SESSION: progress core strength with rotation and pallof series if shoulder tolerates; hip stability progression; fascial release around incision ? ? ? ?Jule Ser, PT ?11/24/2021, 4:17 PM ? ?  ? ?

## 2021-11-28 ENCOUNTER — Other Ambulatory Visit: Payer: Self-pay

## 2021-11-28 ENCOUNTER — Encounter (HOSPITAL_BASED_OUTPATIENT_CLINIC_OR_DEPARTMENT_OTHER): Payer: Self-pay

## 2021-11-28 ENCOUNTER — Observation Stay (HOSPITAL_BASED_OUTPATIENT_CLINIC_OR_DEPARTMENT_OTHER)
Admission: EM | Admit: 2021-11-28 | Discharge: 2021-11-29 | Disposition: A | Discharge status: Home / Self Care | Payer: Managed Care, Other (non HMO) | Attending: Family Medicine | Admitting: Family Medicine

## 2021-11-28 ENCOUNTER — Ambulatory Visit (HOSPITAL_COMMUNITY)
Admission: EM | Admit: 2021-11-28 | Discharge: 2021-11-28 | Disposition: A | Discharge status: Other Acute Inpatient Hospital | Payer: Managed Care, Other (non HMO) | Attending: Physician Assistant | Admitting: Physician Assistant

## 2021-11-28 ENCOUNTER — Emergency Department (HOSPITAL_BASED_OUTPATIENT_CLINIC_OR_DEPARTMENT_OTHER): Payer: Managed Care, Other (non HMO) | Admitting: Radiology

## 2021-11-28 ENCOUNTER — Encounter (HOSPITAL_COMMUNITY): Payer: Self-pay | Admitting: *Deleted

## 2021-11-28 DIAGNOSIS — G4733 Obstructive sleep apnea (adult) (pediatric): Secondary | ICD-10-CM | POA: Diagnosis not present

## 2021-11-28 DIAGNOSIS — Z91040 Latex allergy status: Secondary | ICD-10-CM | POA: Insufficient documentation

## 2021-11-28 DIAGNOSIS — R002 Palpitations: Secondary | ICD-10-CM | POA: Diagnosis present

## 2021-11-28 DIAGNOSIS — J45909 Unspecified asthma, uncomplicated: Secondary | ICD-10-CM | POA: Insufficient documentation

## 2021-11-28 DIAGNOSIS — I480 Paroxysmal atrial fibrillation: Principal | ICD-10-CM | POA: Insufficient documentation

## 2021-11-28 DIAGNOSIS — I4891 Unspecified atrial fibrillation: Secondary | ICD-10-CM | POA: Diagnosis present

## 2021-11-28 LAB — BASIC METABOLIC PANEL
Anion gap: 11 (ref 5–15)
BUN: 13 mg/dL (ref 6–20)
CO2: 27 mmol/L (ref 22–32)
Calcium: 9.8 mg/dL (ref 8.9–10.3)
Chloride: 104 mmol/L (ref 98–111)
Creatinine, Ser: 0.77 mg/dL (ref 0.44–1.00)
GFR, Estimated: >60 mL/min (ref >60)
Glucose, Bld: 97 mg/dL (ref 70–99)
Potassium: 3.7 mmol/L (ref 3.5–5.1)
Sodium: 142 mmol/L (ref 135–145)

## 2021-11-28 LAB — TROPONIN I (HIGH SENSITIVITY)
Troponin I (High Sensitivity): 4 ng/L (ref <18)
Troponin I (High Sensitivity): 5 ng/L (ref <18)

## 2021-11-28 LAB — CBC
HCT: 41.6 % (ref 36.0–46.0)
Hemoglobin: 14.4 g/dL (ref 12.0–15.0)
MCH: 32.3 pg (ref 26.0–34.0)
MCHC: 34.6 g/dL (ref 30.0–36.0)
MCV: 93.3 fL (ref 80.0–100.0)
Platelets: 270 10*3/uL (ref 150–400)
RBC: 4.46 MIL/uL (ref 3.87–5.11)
RDW: 12.3 % (ref 11.5–15.5)
WBC: 6.5 10*3/uL (ref 4.0–10.5)
nRBC: 0 % (ref 0.0–0.2)

## 2021-11-28 MED ORDER — DILTIAZEM HCL-DEXTROSE 125-5 MG/125ML-% IV SOLN (PREMIX)
5.0000 mg/h | INTRAVENOUS | Status: DC
  Administered 2021-11-28: 5 mg/h via INTRAVENOUS
  Filled 2021-11-28 (×2): qty 125

## 2021-11-28 NOTE — ED Triage Notes (Signed)
Pt reports having Palpitations today with a recent hx of A-fib . Pt's cards is Bridgette Christopher,MD

## 2021-11-28 NOTE — ED Provider Notes (Addendum)
[facility] EMERGENCY DEPT Provider Note   CSN: [id number] Arrival date & time: 11/28/21  1639     History  Chief Complaint  Patient presents with   Palpitations    Ariana Walsh is a 51 y.o. female.  Patient with a history of some palpitations on and off that is post having COVID in August.  Symptoms started in September.  Is followed by [facility] cardiology.  They have not been able to improve the cause for the palpitations.  Patient had onset earlier today and neck pain that remained there until she got here shortly after getting here she converted back to sinus on her own.  Initial EKG was consistent with atrial for with RVR.  Associated with a little bit of mild chest discomfort.  No hypoxia.  Oxygen saturations are in the upper 90s.  Patient is not on any medication.  Patient's past medical history significant for umbilical hernia and arthritis in both hips.  And exercise-induced asthma.  Patient is a vegetarian.      Home Medications Prior to Admission medications   Medication Sig Start Date End Date Taking? Authorizing Provider  Eflornithine HCl (VANIQA) 13.9 % cream Apply to area twice a day 01/26/21   [name], MD  montelukast (SINGULAIR) 10 MG tablet TAKE ONE TABLET BY MOUTH AT BEDTIME 10/14/21   [name], MD  Peak Flow Meter DEVI Use as needed to check your breathing. Log your values 06/02/20   [name], MD  PROAIR HFA 108 [phone] Base) MCG/ACT inhaler USE 2 PUFFS EVERY 4 HOURS AS NEEDED FOR COUGH OR SHORTNESS OF BREATH. 05/28/20   [name], MD      Allergies    Latex    Review of Systems   Review of Systems  Constitutional:  Negative for chills and fever.  HENT:  Negative for ear pain and sore throat.   Eyes:  Negative for pain and visual disturbance.  Respiratory:  Negative for cough and shortness of breath.   Cardiovascular:  Positive for chest pain and palpitations.  Gastrointestinal:  Negative for abdominal pain and  vomiting.  Genitourinary:  Negative for dysuria and hematuria.  Musculoskeletal:  Negative for arthralgias and back pain.  Skin:  Negative for color change and rash.  Neurological:  Negative for seizures and syncope.  All other systems reviewed and are negative.  Physical Exam Updated Vital Signs BP (!) 145/102 (BP Location: Right Arm)   Pulse 100   Temp 98.6 F (37 C)   Resp 12   Ht 1.676 m (5\' 6" )   Wt 77.8 kg   SpO2 97%   BMI 27.68 kg/m  Physical Exam Vitals and nursing note reviewed.  Constitutional:      General: She is not in acute distress.    Appearance: Normal appearance. She is well-developed. She is not ill-appearing.  HENT:     Head: Normocephalic and atraumatic.  Eyes:     Extraocular Movements: Extraocular movements intact.     Conjunctiva/sclera: Conjunctivae normal.     Pupils: Pupils are equal, round, and reactive to light.  Cardiovascular:     Rate and Rhythm: Tachycardia present. Rhythm irregular.     Heart sounds: No murmur heard. Pulmonary:     Effort: Pulmonary effort is normal. No respiratory distress.     Breath sounds: Normal breath sounds. No wheezing, rhonchi or rales.  Abdominal:     Palpations: Abdomen is soft.     Tenderness: There is no  abdominal tenderness.  Musculoskeletal:        General: No swelling.     Cervical back: Neck supple.  Skin:    General: Skin is warm and dry.     Capillary Refill: Capillary refill takes less than 2 seconds.  Neurological:     General: No focal deficit present.     Mental Status: She is alert and oriented to person, place, and time.     Cranial Nerves: No cranial nerve deficit.     Sensory: No sensory deficit.     Motor: No weakness.  Psychiatric:        Mood and Affect: Mood normal.    ED Results / Procedures / Treatments   Labs (all labs ordered are listed, but only abnormal results are displayed) Labs Reviewed  BASIC METABOLIC PANEL  CBC  TROPONIN I (HIGH SENSITIVITY)  TROPONIN I (HIGH  SENSITIVITY)    EKG EKG Interpretation  Date/Time:  Saturday Nov 28 2021 18:53:17 EDT Ventricular Rate:  123 PR Interval:    QRS Duration: 84 QT Interval:  306 QTC Calculation: 438 R Axis:   84 Text Interpretation: Atrial fibrillation Low voltage, precordial leads Minimal ST depression, inferior leads Confirmed by Vanetta Mulders [phone]) on 11/28/2021 8:45:28 PM  Radiology DG Chest Port 1 View  Result Date: 11/28/2021 CLINICAL DATA:  Palpitations EXAM: PORTABLE CHEST 1 VIEW COMPARISON:  None Available. FINDINGS: The heart size and mediastinal contours are within normal limits. Both lungs are clear. The visualized skeletal structures are unremarkable. IMPRESSION: No active disease. Electronically Signed   By: Ernie Avena M.D.   On: 11/28/2021 17:29    Procedures Procedures    Medications Ordered in ED Medications  diltiazem (CARDIZEM) 125 mg in dextrose 5% 125 mL (1 mg/mL) infusion (5 mg/hr Intravenous New Bag/Given 11/28/21 2052)    ED Course/ Medical Decision Making/ A&P                           Medical Decision Making Amount and/or Complexity of Data Reviewed Labs: ordered. Radiology: ordered.  Risk Prescription drug management. Decision regarding hospitalization.   CRITICAL CARE Performed by: Vanetta Mulders Total critical care time: 45 minutes Critical care time was exclusive of separately billable procedures and treating other patients. Critical care was necessary to treat or prevent imminent or life-threatening deterioration. Critical care was time spent personally by me on the following activities: development of treatment plan with patient and/or surrogate as well as nursing, discussions with consultants, evaluation of patient's response to treatment, examination of patient, obtaining history from patient or surrogate, ordering and performing treatments and interventions, ordering and review of laboratory studies, ordering and review of radiographic  studies, pulse oximetry and re-evaluation of patient's condition.  Patient's initial EKG showed atrial fibrillation.  Heart rates been anywhere from 120s to 140s.  She did convert back to sinus rhythm.  But now she is back in rapid atrial fibs heart rate also running 120s to 140s.  Patient did have some mild chest discomfort with it.  Troponin was 5 and 4.  So no significant change in troponins.  Patient metabolic panel is normal.  CBC no leukocytosis hemoglobin 14.5.  Chest x-ray no active disease.   Patient is fairly certain that it just started this morning.  She does note feels that she normally can tell.  But since she stopped and went back into it we will go ahead and start her on diltiazem.  We will  get her admitted.  We will have him titrate the diltiazem.  Will discuss with hospitalist Eliquis or heparin as an anticoagulation.  And patient can also be [facility] by [facility] cardiology since they follow her as an outpatient.   Actually after calculating patient's VASC score is 1.  But not a candidate for anticoagulation at this time.   Final Clinical Impression(s) / ED Diagnoses Final diagnoses:  Atrial fibrillation with RVR [facility](HCC)    Rx / DC Orders ED Discharge Orders     None         Vanetta MuldersZackowski, Dauntae Derusha, MD 11/28/21 [id number]    Vanetta MuldersZackowski, Kayci [place], MD 11/28/21 [id number]    Vanetta MuldersZackowski, Shaletha Humble, MD 11/28/21 2202

## 2021-11-28 NOTE — ED Triage Notes (Signed)
Patient here POV from UC.  Seen at [facility] today PTA for Palpitations. EKG at [facility] displayed A. Fib. With RVR.  Seen by Cardiologist since A. Fib Diagnosis late last Year.  Endorses Chest Tightness and Mild SOB.  NAD Noted during Triage. A&Ox4. GCS 15. Ambulatory.

## 2021-11-28 NOTE — ED Notes (Signed)
Pt ambulated to the bathroom and given warm blanket. Denies other needs at this time.

## 2021-11-28 NOTE — ED Notes (Signed)
ED Provider at bedside. 

## 2021-11-28 NOTE — ED Notes (Signed)
Pt went back into AFIB. EKG shot given to MD. Pt reports chest tightness but is hesitant to put a number on pain But is more anxious.

## 2021-11-28 NOTE — ED Provider Notes (Signed)
Pt seen in triage for palpitations.  She reports she was seen in two weeks ago by cardiologist for this problems and given a heart rate monitor.  She reports today her heart rate has been elevated with monitor reporting possible a-fib.    EKG done in triage with a-fib with rvr.  Advised to be seen in the ED for further management.  Pt reports she is anxious, states she has had some shortness of breath.  Elevated BP.  Pt agrees with plan for further eval in ED.    Ward, Lenise Arena, PA-C 11/28/21 1652

## 2021-11-28 NOTE — ED Notes (Signed)
2nd EKG given to MD who is aware of same and compared to first

## 2021-11-29 ENCOUNTER — Observation Stay (HOSPITAL_BASED_OUTPATIENT_CLINIC_OR_DEPARTMENT_OTHER): Payer: Managed Care, Other (non HMO)

## 2021-11-29 ENCOUNTER — Encounter (HOSPITAL_COMMUNITY): Payer: Self-pay | Admitting: Internal Medicine

## 2021-11-29 DIAGNOSIS — I4891 Unspecified atrial fibrillation: Secondary | ICD-10-CM | POA: Diagnosis not present

## 2021-11-29 DIAGNOSIS — R002 Palpitations: Secondary | ICD-10-CM | POA: Diagnosis present

## 2021-11-29 DIAGNOSIS — J45909 Unspecified asthma, uncomplicated: Secondary | ICD-10-CM | POA: Diagnosis not present

## 2021-11-29 DIAGNOSIS — G4733 Obstructive sleep apnea (adult) (pediatric): Secondary | ICD-10-CM | POA: Diagnosis not present

## 2021-11-29 DIAGNOSIS — Z9989 Dependence on other enabling machines and devices: Secondary | ICD-10-CM

## 2021-11-29 DIAGNOSIS — Z91040 Latex allergy status: Secondary | ICD-10-CM | POA: Diagnosis not present

## 2021-11-29 DIAGNOSIS — I480 Paroxysmal atrial fibrillation: Secondary | ICD-10-CM | POA: Diagnosis present

## 2021-11-29 LAB — CBC WITH DIFFERENTIAL/PLATELET
Abs Immature Granulocytes: 0 10*3/uL (ref 0.00–0.07)
Basophils Absolute: 0 10*3/uL (ref 0.0–0.1)
Basophils Relative: 1 %
Eosinophils Absolute: 0.1 10*3/uL (ref 0.0–0.5)
Eosinophils Relative: 2 %
HCT: 40.2 % (ref 36.0–46.0)
Hemoglobin: 15 g/dL (ref 12.0–15.0)
Immature Granulocytes: 0 %
Lymphocytes Relative: 40 %
Lymphs Abs: 2.3 10*3/uL (ref 0.7–4.0)
MCH: 34.2 pg — ABNORMAL HIGH (ref 26.0–34.0)
MCHC: 37.3 g/dL — ABNORMAL HIGH (ref 30.0–36.0)
MCV: 91.8 fL (ref 80.0–100.0)
Monocytes Absolute: 0.3 10*3/uL (ref 0.1–1.0)
Monocytes Relative: 6 %
Neutro Abs: 2.9 10*3/uL (ref 1.7–7.7)
Neutrophils Relative %: 51 %
Platelets: 267 10*3/uL (ref 150–400)
RBC: 4.38 MIL/uL (ref 3.87–5.11)
RDW: 12.1 % (ref 11.5–15.5)
WBC: 5.7 10*3/uL (ref 4.0–10.5)
nRBC: 0 % (ref 0.0–0.2)

## 2021-11-29 LAB — COMPREHENSIVE METABOLIC PANEL
ALT: 26 U/L (ref 0–44)
AST: 19 U/L (ref 15–41)
Albumin: 4.2 g/dL (ref 3.5–5.0)
Alkaline Phosphatase: 53 U/L (ref 38–126)
Anion gap: 9 (ref 5–15)
BUN: 10 mg/dL (ref 6–20)
CO2: 23 mmol/L (ref 22–32)
Calcium: 9.4 mg/dL (ref 8.9–10.3)
Chloride: 106 mmol/L (ref 98–111)
Creatinine, Ser: 0.61 mg/dL (ref 0.44–1.00)
GFR, Estimated: >60 mL/min (ref >60)
Glucose, Bld: 111 mg/dL — ABNORMAL HIGH (ref 70–99)
Potassium: 3.5 mmol/L (ref 3.5–5.1)
Sodium: 138 mmol/L (ref 135–145)
Total Bilirubin: 0.7 mg/dL (ref 0.3–1.2)
Total Protein: 6.9 g/dL (ref 6.5–8.1)

## 2021-11-29 LAB — MRSA NEXT GEN BY PCR, NASAL: MRSA by PCR Next Gen: NOT DETECTED

## 2021-11-29 LAB — ECHOCARDIOGRAM COMPLETE
AR max vel: 2.22 cm2
AV Peak grad: 9.5 mmHg
Ao pk vel: 1.54 m/s
Area-P 1/2: 3.31 cm2
Calc EF: 58.6 %
Height: 66 in
S' Lateral: 2.7 cm
Single Plane A2C EF: 56.6 %
Single Plane A4C EF: 58.4 %
Weight: 2744.29 oz

## 2021-11-29 LAB — D-DIMER, QUANTITATIVE: D-Dimer, Quant: <0.27 ug/mL-FEU (ref 0.00–0.50)

## 2021-11-29 LAB — TSH: TSH: 3.785 u[IU]/mL (ref 0.350–4.500)

## 2021-11-29 LAB — BRAIN NATRIURETIC PEPTIDE: B Natriuretic Peptide: 208.3 pg/mL — ABNORMAL HIGH (ref 0.0–100.0)

## 2021-11-29 LAB — RAPID URINE DRUG SCREEN, HOSP PERFORMED
Amphetamines: NOT DETECTED
Barbiturates: NOT DETECTED
Benzodiazepines: NOT DETECTED
Cocaine: NOT DETECTED
Opiates: NOT DETECTED
Tetrahydrocannabinol: NOT DETECTED

## 2021-11-29 LAB — MAGNESIUM: Magnesium: 1.9 mg/dL (ref 1.7–2.4)

## 2021-11-29 LAB — HIV ANTIBODY (ROUTINE TESTING W REFLEX): HIV Screen 4th Generation wRfx: NONREACTIVE

## 2021-11-29 MED ORDER — ALBUTEROL SULFATE (2.5 MG/3ML) 0.083% IN NEBU: 2.5000 mg | INHALATION_SOLUTION | RESPIRATORY_TRACT | Status: DC | PRN

## 2021-11-29 MED ORDER — ENOXAPARIN SODIUM 40 MG/0.4ML IJ SOSY: 40.0000 mg | PREFILLED_SYRINGE | Freq: Every day | INTRAMUSCULAR | Status: DC

## 2021-11-29 MED ORDER — FLECAINIDE ACETATE 100 MG PO TABS
200.0000 mg | ORAL_TABLET | Freq: Once | ORAL | Status: AC
Start: 2021-11-29 — End: 2021-11-29
  Administered 2021-11-29: 200 mg via ORAL
  Filled 2021-11-29: qty 2

## 2021-11-29 MED ORDER — MONTELUKAST SODIUM 10 MG PO TABS: 10.0000 mg | ORAL_TABLET | Freq: Every day | ORAL | Status: DC

## 2021-11-29 MED ORDER — ACETAMINOPHEN 325 MG PO TABS: 650.0000 mg | ORAL_TABLET | Freq: Four times a day (QID) | ORAL | Status: DC | PRN

## 2021-11-29 MED ORDER — DILTIAZEM HCL 60 MG PO TABS
30.0000 mg | ORAL_TABLET | Freq: Four times a day (QID) | ORAL | Status: DC
  Administered 2021-11-29: 30 mg via ORAL
  Filled 2021-11-29: qty 1

## 2021-11-29 MED ORDER — ACETAMINOPHEN 650 MG RE SUPP: 650.0000 mg | Freq: Four times a day (QID) | RECTAL | Status: DC | PRN

## 2021-11-29 MED ORDER — ONDANSETRON HCL 4 MG/2ML IJ SOLN
4.0000 mg | Freq: Four times a day (QID) | INTRAMUSCULAR | Status: DC | PRN
Start: 2021-11-29 — End: 2021-11-29

## 2021-11-29 MED ORDER — APIXABAN 5 MG PO TABS
5.0000 mg | ORAL_TABLET | Freq: Two times a day (BID) | ORAL | Status: DC
  Administered 2021-11-29: 5 mg via ORAL
  Filled 2021-11-29: qty 1

## 2021-11-29 MED ORDER — APIXABAN 5 MG PO TABS
5.0000 mg | ORAL_TABLET | Freq: Two times a day (BID) | ORAL | 0 refills | Status: DC
Start: 2021-11-29 — End: 2022-03-04

## 2021-11-29 MED ORDER — APIXABAN (ELIQUIS) EDUCATION KIT FOR DVT/PE PATIENTS
PACK | Freq: Once | Status: AC
  Filled 2021-11-29 (×2): qty 1

## 2021-11-29 MED ORDER — METOPROLOL SUCCINATE ER 25 MG PO TB24
25.0000 mg | ORAL_TABLET | Freq: Once | ORAL | Status: AC
  Administered 2021-11-29: 25 mg via ORAL
  Filled 2021-11-29: qty 1

## 2021-11-29 MED ORDER — METOPROLOL SUCCINATE ER 25 MG PO TB24: 25.0000 mg | ORAL_TABLET | Freq: Every day | ORAL | 0 refills | Status: DC

## 2021-11-29 MED ORDER — POLYETHYLENE GLYCOL 3350 17 G PO PACK: 17.0000 g | PACK | Freq: Every day | ORAL | Status: DC | PRN

## 2021-11-29 MED ORDER — ONDANSETRON HCL 4 MG PO TABS: 4.0000 mg | ORAL_TABLET | Freq: Four times a day (QID) | ORAL | Status: DC | PRN

## 2021-11-29 NOTE — Plan of Care (Signed)
  Problem: Education: Goal: Knowledge of disease or condition will improve Outcome: Completed/Met Goal: Understanding of medication regimen will improve Outcome: Completed/Met Goal: Individualized Educational Video(s) Outcome: Completed/Met   Problem: Activity: Goal: Ability to tolerate increased activity will improve Outcome: Completed/Met   Problem: Cardiac: Goal: Ability to achieve and maintain adequate cardiopulmonary perfusion will improve Outcome: Completed/Met   Problem: Health Behavior/Discharge Planning: Goal: Ability to safely manage health-related needs after discharge will improve Outcome: Completed/Met

## 2021-11-29 NOTE — Consult Note (Addendum)
Cardiology Consultation:   Patient ID: Inocencia Ixta MRN: [id number]; DOB: Aug 02, 1970  Admit date: 11/28/2021 Date of Consult: 11/29/2021  PCP:  [name], MD   [facility] Providers Cardiologist:  Buford Dresser, MD   {   Patient Profile:   Ebonye Defusco is a 51 y.o. female with a hx of asthma, osteoarthritis, DJD, OSA, COVID-19 in 2022 who is being seen 11/29/2021 for the evaluation of A fib at the request of Dr. Cyd Silence.  History of Present Illness:   Ms. Steagall follows Dr. Harrell Gave outpatient, had complained occasional heart palpitation since her COVID-19 infection in 02/2021. She had 3 day event monitor completed on 03/03/21 which revealed predominantly sinus rhythm, <1 percent ventricular and supraventricular ectopy, no atrial fibrillation, 9 SVT episodes all less than 10 beats noted.  She does have family history of MIs.  She was last seen by Dr. Harrell Gave on 11/11/2021, recommended a coronary calcium score scan which is scheduled in June 2023.   Patient presented to the ER last night complaining heart palpitation.  She has been feeling fine since her cardiology visit.  On 11/28/2021, patient had experienced severe episode of heart palpitation around 2:30 PM when she was resting, where she began to experience chest tightness.  She noted midsternal chest tightness radiating to her neck.  She had purchased a home monitor which told her that she was in possible atrial fibrillation.  She reports associated mild lightheadedness, shortness of breath.  She also felt very anxious.  She thinks that her positional change is triggering the atrial fibrillation, also felt she had spontaneous conversion to sinus rhythm yesterday.  She went to the local urgent care, had a EKG completed which confirmed patient was in atrial fibrillation with RVR.  Therefore she was instructed to go to the ER for further evaluation.  She denies any known family history of atrial  fibrillation.  She reports weight gain 45 pounds over the past month, denied any leg swelling or orthopnea.  Admission diagnostic revealed grossly unremarkable CBC and BMP.  High sensitive troponin 4 >5.  BNP 208.  D-dimer <0.27.  TSH WNL.  UDS negative.  X-ray revealed no acute finding.  EKG showed A fib with RVR 116 bpm.  Patient was subsequently admitted to hospital medicine, started on diltiazem infusion, started on anticoagulation with Eliquis 5 mg twice daily.  Echocardiogram is pending.  Cardiology is consulted today.      Past Medical History:  Diagnosis Date   DJD (degenerative joint disease), lumbar 11/22/2019   Xray 11/2019   Exercise-induced asthma 09/21/2018   Allergy induced also   Hernia, umbilical    Osteoarthritis, hip, bilateral 11/22/2019   xrays 11/2019   Vitamin B12 deficiency 11/20/2019    Past Surgical History:  Procedure Laterality Date   DENTAL SURGERY     HERNIA REPAIR     IVF     egg collection     Home Medications:  Prior to Admission medications   Medication Sig Start Date End Date Taking? Authorizing Provider  Eflornithine HCl (VANIQA) 13.9 % cream Apply to area twice a day 01/26/21   [name], MD  montelukast (SINGULAIR) 10 MG tablet TAKE ONE TABLET BY MOUTH AT BEDTIME 10/14/21   [name], MD  Peak Flow Meter DEVI Use as needed to check your breathing. Log your values 06/02/20   [name], MD  PROAIR HFA 108 [phone] Base) MCG/ACT inhaler USE 2 PUFFS EVERY 4 HOURS AS NEEDED FOR  COUGH OR SHORTNESS OF BREATH. 05/28/20   [name], MD    Inpatient Medications: Scheduled Meds:  apixaban  5 mg Oral BID   diltiazem  30 mg Oral Q6H   montelukast  10 mg Oral QHS   Continuous Infusions:   PRN Meds: acetaminophen **OR** acetaminophen, albuterol, ondansetron **OR** ondansetron (ZOFRAN) IV, polyethylene glycol  Allergies:    Allergies  Allergen Reactions   Latex     Social History:   Social History   Socioeconomic History    Marital status: Single    Spouse name: Jess Barters   Number of children: 1   Years of education: MFA in Directing   Highest education level: Not on file  Occupational History   Occupation: Adjunct professor     Comment: Civil engineer, contracting: [employer]  Tobacco Use   Smoking status: Never   Smokeless tobacco: Never  Vaping Use   Vaping Use: Never used  Substance and Sexual Activity   Alcohol use: Not Currently    Alcohol/week: 0.0 - 3.0 standard drinks   Drug use: No   Sexual activity: Yes    Partners: Male    Birth control/protection: Surgical    Comment: Partner has vasectomy   Other Topics Concern   Not on file  Social History Narrative   Estranged from husband since 2011.   Lives with her son.   Her son was conceived by IVF after she and her husband split.   Breast fed x 4 years.   Social Determinants of Health   Financial Resource Strain: Not on file  Food Insecurity: Not on file  Transportation Needs: Not on file  Physical Activity: Not on file  Stress: Not on file  Social Connections: Not on file  Intimate Partner Violence: Not on file    Family History:    Family History  Problem Relation Age of Onset   Breast cancer Mother        post-menopausal, DCIS   Endometrial cancer Mother        mid-late 72's   Hypertension Mother    Hypertension Father    Diabetes Father    Heart disease Father        s/p 3V CABG age 20   Mental illness Father        depression   Arthritis Father        bilateral knee replacements   Cancer Father 20       unknown primary, metastatic   Breast cancer Cousin    Mental illness Brother        depression   Healthy Son    Colon cancer Neg Hx      ROS:  Constitutional: Denied fever, chills, malaise, night sweats Eyes: Denied vision change or loss Ears/Nose/Mouth/Throat: Denied ear ache, sore throat, coughing, sinus pain Cardiovascular: see HPI Respiratory: see HPI Gastrointestinal: Denied nausea,  vomiting, abdominal pain, diarrhea Genital/Urinary: Denied dysuria, hematuria, urinary frequency/urgency Musculoskeletal: Denied muscle ache, joint pain, weakness Skin: Denied rash, wound Neuro: Denied headache, dizziness, syncope Psych: Denied history of depression/anxiety  Endocrine: Denied history of diabetes     Physical Exam/Data:   Vitals:   11/29/21 0112 11/29/21 0348 11/29/21 0422 11/29/21 0740  BP: 107/90 130/79 113/76 (!) 107/92  Pulse: 96 71  84  Resp: 18 20  12   Temp: 97.9 F (36.6 C) 98 F (36.7 C)  98.3 F (36.8 C)  TempSrc: Oral Oral  Oral  SpO2: 97% 95%  98%  Weight:      Height:        Intake/Output Summary (Last 24 hours) at 11/29/2021 0758 Last data filed at 11/29/2021 0300 Gross per 24 hour  Intake 51.49 ml  Output --  Net 51.49 ml      11/28/2021    4:56 PM 11/11/2021   11:07 AM 08/28/2021   11:28 AM  Last 3 Weights  Weight (lbs) 171 lb 8.3 oz 171 lb 8 oz 168 lb 9.6 oz  Weight (kg) 77.8 kg 77.792 kg 76.476 kg     Body mass index is 27.68 kg/m.   Vitals:  Vitals:   11/29/21 0422 11/29/21 0740  BP: 113/76 (!) 107/92  Pulse:  84  Resp:  12  Temp:  98.3 F (36.8 C)  SpO2:  98%   General Appearance: In no apparent distress, laying in bed HEENT: Normocephalic, atraumatic.  Neck: Supple, trachea midline, no JVDs Cardiovascular: Irregularly irregular, S1-S2, no murmur  Respiratory: Resting breathing unlabored, lungs sounds clear to auscultation bilaterally, no use of accessory muscles. On room air.  No wheezes, rales or rhonchi.   Gastrointestinal: Bowel sounds positive, abdomen soft  Extremities: Able to move all extremities in bed without difficulty, no edema Musculoskeletal: Normal muscle bulk and tone Skin: Intact, warm, dry. No rashes or petechiae noted in exposed areas.  Neurologic: Alert, oriented to person, place and time. Fluent speech, no facial droop, no cognitive deficit Psychiatric: Normal affect. Mood is appropriate.    EKG:   The EKG was personally reviewed and demonstrates:    Admission EKG from 11/28/2021 at 1613 revealed A-fib with RVR 116bpm.   Telemetry:  Telemetry was personally reviewed and demonstrates:    A-fib with RVR with rate up to 120 this morning  Relevant CV Studies:  Echocardiogram is pending  Laboratory Data:  High Sensitivity Troponin:   Recent Labs  Lab 11/28/21 1712 11/28/21 1920  TROPONINIHS 4 5     Chemistry Recent Labs  Lab 11/28/21 1712 11/29/21 0508  NA 142 138  K 3.7 3.5  CL 104 106  CO2 27 23  GLUCOSE 97 111*  BUN 13 10  CREATININE 0.77 0.61  CALCIUM 9.8 9.4  MG  --  1.9  GFRNONAA >60 >60  ANIONGAP 11 9    Recent Labs  Lab 11/29/21 0508  PROT 6.9  ALBUMIN 4.2  AST 19  ALT 26  ALKPHOS 53  BILITOT 0.7   Lipids No results for input(s): CHOL, TRIG, HDL, LABVLDL, LDLCALC, CHOLHDL in the last 168 hours.  Hematology Recent Labs  Lab 11/28/21 1712 11/29/21 0508  WBC 6.5 5.7  RBC 4.46 4.38  HGB 14.4 15.0  HCT 41.6 40.2  MCV 93.3 91.8  MCH 32.3 34.2*  MCHC 34.6 37.3*  RDW 12.3 12.1  PLT 270 267   Thyroid  Recent Labs  Lab 11/29/21 0508  TSH 3.785    BNP Recent Labs  Lab 11/29/21 0508  BNP 208.3*    DDimer  Recent Labs  Lab 11/29/21 [id number]  DDIMER <0.27     Radiology/Studies:  DG Chest Port 1 View  Result Date: 11/28/2021 CLINICAL DATA:  Palpitations EXAM: PORTABLE CHEST 1 VIEW COMPARISON:  None Available. FINDINGS: The heart size and mediastinal contours are within normal limits. Both lungs are clear. The visualized skeletal structures are unremarkable. IMPRESSION: No active disease. Electronically Signed   By: Elmer Picker M.D.   On: 11/28/2021 17:29     Assessment and Plan:   Newly diagnosed  atrial fibrillation with RVR -Possibly onset since yesterday when patient experienced worsening/persistent heart palpitation -Currently rate control improved with IV diltiazem, agree with transition to p.o. Cardizem -Pending  echocardiogram to evaluate cardiomyopathy, will follow -CHA2DS2-VASc score is 1, started on Eliquis 5mg  BID in anticipation of cardioversion per hospitalist  -She is very anxious and symptomatic despite improved rate control, discussed option of TEE with DCCV versus PO meds rate control and follow up with A fib clinic in 2 weeks, patient wishes time to think over  OSA - compliant with CPAP  Risk Assessment/Risk Scores:    CHA2DS2-VASc Score = 1  This indicates a 0.6% annual risk of stroke. The patient's score is based upon: CHF History: 0 HTN History: 0 Diabetes History: 0 Stroke History: 0 Vascular Disease History: 0 Age Score: 0 Gender Score: 1      For questions or updates, please contact [facility] Please consult [email or web address] for contact info under   Signed, Margie Billet, NP  11/29/2021 7:58 AM   EP Attending  Patient seen and examined. Agree with the findings as noted above. The patient is a pleasant 51 yo woman who went into atrial fib at 2:30 p.m. yesterday afternoon. She presented for eval and has been given IV cardizem and switched to oral cardizem. Her rates in atrial fib are improved but she has not converted. She has been on eliquis since admit. On exam she is a well appearing woman, NAD. Lungs are clear and CV reveals an IRIR rhythm. Neuro is non-focal.  A/P Persistent atrial fib - I have recommended we give her flecainide and metoprolol. If she does not convert to NSR then DCCV tomorrow morning, schedule permitting. As her CHADSVASC is low, and she is clear about when she went into atrial fib and less than 48 hours, she does not need a TEE.   Carleene Overlie Gabbie Marzo,MD

## 2021-11-29 NOTE — Progress Notes (Signed)
ANTICOAGULATION CONSULT NOTE - Initial Consult  Pharmacy Consult for Eliquis Indication: atrial fibrillation  Allergies  Allergen Reactions   Latex     Patient Measurements: Height: 5\' 6"  (167.6 cm) Weight: 77.8 kg (171 lb 8.3 oz) IBW/kg (Calculated) : 59.3  Vital Signs: Temp: 98 F (36.7 C) (05/21 0348) Temp Source: Oral (05/21 0348) BP: 113/76 (05/21 0422) Pulse Rate: 71 (05/21 0348)  Labs: Recent Labs    11/28/21 1712 11/28/21 1920  HGB 14.4  --   HCT 41.6  --   PLT 270  --   CREATININE 0.77  --   TROPONINIHS 4 5    Estimated Creatinine Clearance: 86.6 mL/min (by C-G formula based on SCr of 0.77 mg/dL).   Medical History: Past Medical History:  Diagnosis Date   DJD (degenerative joint disease), lumbar 11/22/2019   Xray 11/2019   Exercise-induced asthma 09/21/2018   Allergy induced also   Hernia, umbilical    Osteoarthritis, hip, bilateral 11/22/2019   xrays 11/2019   Vitamin B12 deficiency 11/20/2019    Medications:  Medications Prior to Admission  Medication Sig Dispense Refill Last Dose   Eflornithine HCl (VANIQA) 13.9 % cream Apply to area twice a day 45 g 11    montelukast (SINGULAIR) 10 MG tablet TAKE ONE TABLET BY MOUTH AT BEDTIME 30 tablet 3    Peak Flow Meter DEVI Use as needed to check your breathing. Log your values 1 each 0    PROAIR HFA 108 (90 Base) MCG/ACT inhaler USE 2 PUFFS EVERY 4 HOURS AS NEEDED FOR COUGH OR SHORTNESS OF BREATH. 8.5 g 5     Assessment: 51 y.o. female with new onset Afib for Eliquis Plan:  Eliquis 5 mg BID  44 11/29/2021,5:22 AM

## 2021-11-29 NOTE — H&P (Addendum)
History and Physical    Patient: Ariana Walsh MRN: [id number] DOA: 11/28/2021  Date of Service: the patient was seen and examined on 11/29/2021  Patient coming from: Home via [facility]  Chief Complaint:  Chief Complaint  Patient presents with   Palpitations    HPI:   51 year old female with past medical history of asthma, osteoarthritis who presents to [facility] with complaints of palpitations.  Patient has been experiencing bouts of palpitations since at least 02/2021 shortly after having COVID.  Patient has since continued to experience bouts of palpitations and has seen Dr. Harrell Gave with cardiology several weeks ago for outpatient evaluation.  Earlier in the day on 5/20, the patient developed rather severe palpitations to the point where she began experiencing chest discomfort.  Chest discomfort is mild in intensity midsternal in location and occasionally radiating to the neck.  Patient denies significant shortness of breath, fever, cough, leg swelling, recent travel, sick contacts.   Patient had recently purchased a home monitor online which identified she was likely in atrial fibrillation   Due to persisting symptoms and the identification of afib on her monitor, the patient went to a local urgent care clinic where EKG was performed and patient was found to be in rapid atrial fibrillation.  Patient was instructed to go to the emergency department for further evaluation.  Upon evaluation at [facility] emergency department where patient continued to go in and out of rapid atrial fibrillation.  Patient was initiated on intravenous diltiazem infusion and patient was transferred to [facility] for continued medical care and the hospitalist service.    Review of Systems: Review of Systems  Cardiovascular:  Positive for chest pain and palpitations.    Past Medical History:  Diagnosis Date   DJD (degenerative joint disease), lumbar 11/22/2019   Xray  11/2019   Exercise-induced asthma 09/21/2018   Allergy induced also   Hernia, umbilical    Osteoarthritis, hip, bilateral 11/22/2019   xrays 11/2019   Vitamin B12 deficiency 11/20/2019    Past Surgical History:  Procedure Laterality Date   DENTAL SURGERY     HERNIA REPAIR     IVF     egg collection    Social History:  reports that she has never smoked. She has never used smokeless tobacco. She reports that she does not currently use alcohol. She reports that she does not use drugs.  Allergies  Allergen Reactions   Latex     Family History  Problem Relation Age of Onset   Breast cancer Mother        post-menopausal, DCIS   Endometrial cancer Mother        mid-late 49's   Hypertension Mother    Hypertension Father    Diabetes Father    Heart disease Father        s/p 3V CABG age 53   Mental illness Father        depression   Arthritis Father        bilateral knee replacements   Cancer Father 63       unknown primary, metastatic   Breast cancer Cousin    Mental illness Brother        depression   Healthy Son    Colon cancer Neg Hx     Prior to Admission medications   Medication Sig Start Date End Date Taking? Authorizing Provider  Eflornithine HCl (VANIQA) 13.9 % cream Apply to area twice a day 01/26/21  [name], MD  montelukast (SINGULAIR) 10 MG tablet TAKE ONE TABLET BY MOUTH AT BEDTIME 10/14/21   [name], MD  Peak Flow Meter DEVI Use as needed to check your breathing. Log your values 06/02/20   [name], MD  PROAIR HFA 108 [phone] Base) MCG/ACT inhaler USE 2 PUFFS EVERY 4 HOURS AS NEEDED FOR COUGH OR SHORTNESS OF BREATH. 05/28/20   [name], MD    Physical Exam:  Vitals:   11/28/21 2305 11/29/21 0112 11/29/21 0348 11/29/21 0422  BP:  107/90 130/79 113/76  Pulse:  96 71   Resp:  18 20   Temp:  97.9 F (36.6 C) 98 F (36.7 C)   TempSrc:  Oral Oral   SpO2: 100% 97% 95%   Weight:      Height:        Constitutional: Awake alert and  oriented x3, no associated distress.   Skin: no rashes, no lesions, good skin turgor noted. Eyes: Pupils are equally reactive to light.  No evidence of scleral icterus or conjunctival pallor.  ENMT: Moist mucous membranes noted.  Posterior pharynx clear of any exudate or lesions.   Neck: normal, supple, no masses, no thyromegaly.  No evidence of jugular venous distension.   Respiratory: clear to auscultation bilaterally, no wheezing, no crackles. Normal respiratory effort. No accessory muscle use.  Cardiovascular: Irregularly irregular rhythm with controlled rate, no murmurs / rubs / gallops. No extremity edema. 2+ pedal pulses. No carotid bruits.  Chest:   Nontender without crepitus or deformity.   Back:   Nontender without crepitus or deformity. Abdomen: Abdomen is soft and nontender.  No evidence of intra-abdominal masses.  Positive bowel sounds noted in all quadrants.   Musculoskeletal: No joint deformity upper and lower extremities. Good ROM, no contractures. Normal muscle tone.  Neurologic: CN 2-12 grossly intact. Sensation intact.  Patient moving all 4 extremities spontaneously.  Patient is following all commands.  Patient is responsive to verbal stimuli.   Psychiatric: Patient exhibits normal mood with appropriate affect.  Patient seems to possess insight as to their current situation.    Data Reviewed:  I have personally reviewed and interpreted labs, imaging.  Significant findings are: Chemistry reveals sodium 142, potassium 3.7 CBC revealing creatinine 0.77.  White blood cell count 6.5, hemoglobin 14.4, platelet count of 270.   Chest x-ray personally reviewed revealing no evidence of acute cardiopulmonary disease. Troponins 4,5.    EKG: Personally reviewed.  Rhythm is atrial flutter with variable AV block with heart rate of 116 bpm.  No dynamic ST segment changes appreciated.   Assessment and Plan: * Paroxysmal atrial fibrillation with rapid ventricular response (HCC) Patient  presenting with rapid atrial fibrillation and flutter with rapid ventricular response Patient seems to be able to identify exactly when her afib started.  Patient initiated on intravenous AV nodal blocking agents including Cardizem infusion, will be titrated to achieve HR 60-90.  CHA2DS2-VASc score noted to be: 1 indicating that full dose anticoagulation is not warranted however in preparation for a possible cardioversion will place patient on Eliquis 5mg  BID for now.   NPO after midnight in case of cardioversion on 5/21. Further dosing of Eliquis after cardioversion per Cardiology Obtaining echocardiogram in the morning Monitoring patient on telemetry We will consider cardiology consultation in the morning depending on patient's response to rate control efforts. Furthermore, obtaining magnesium, TSH, urine drug screen, troponin and D-dimer to evaluate for possible etiologies    OSA on CPAP  Continuing home regimen of CPAP nightly        Code Status:  Full code  code status decision has been confirmed with: Patient Family Communication: Deferred   Consults: Will obtain cardiology consultation in AM - Inbox message sent to [facility].   Severity of Illness:  The appropriate patient status for this patient is OBSERVATION. Observation status is judged to be reasonable and necessary in order to provide the required intensity of service to ensure the patient's safety. The patient's presenting symptoms, physical exam findings, and initial radiographic and laboratory data in the context of their medical condition is felt to place them at decreased risk for further clinical deterioration. Furthermore, it is anticipated that the patient will be medically stable for discharge from the hospital within 2 midnights of admission.   Author:  Vernelle Emerald MD  11/29/2021 5:11 AM

## 2021-11-29 NOTE — Assessment & Plan Note (Addendum)
.   Patient presenting with rapid atrial fibrillation and flutter with rapid ventricular response . Patient seems to be able to identify exactly when her afib started.  . Patient initiated on intravenous AV nodal blocking agents including Cardizem infusion, will be titrated to achieve HR 60-90.  Marland Kitchen CHA2DS2-VASc score noted to be: 1 indicating that full dose anticoagulation is not warranted however in preparation for a possible cardioversion will place patient on Eliquis 5mg  BID for now.   . NPO after midnight in case of cardioversion on 5/21. . Further dosing of Eliquis after cardioversion per Cardiology . Obtaining echocardiogram in the morning . Monitoring patient on telemetry . We will consider cardiology consultation in the morning depending on patient's response to rate control efforts. . Furthermore, obtaining magnesium, TSH, urine drug screen, troponin and D-dimer to evaluate for possible etiologies

## 2021-11-29 NOTE — Plan of Care (Signed)

## 2021-11-29 NOTE — Assessment & Plan Note (Signed)
.   Continuing home regimen of CPAP nightly  

## 2021-11-29 NOTE — Discharge Summary (Signed)
Physician Discharge Summary   Patient: Ariana Walsh: [id number] DOB: January 21, 1971  Admit date:     11/28/2021  Discharge date: 11/29/21  Discharge Physician: Edwin Dada   PCP: [name], MD     Recommendations at discharge:  Follow up with Cardiology in 10 days Plan for Eliquis 5 mg BID for three weeks (given spontaneous cardioversion in the hospital) then stop Start new metoprolol     Discharge Diagnoses: Principal Problem:   Paroxysmal atrial fibrillation with rapid ventricular response (HCC) Active Problems:   OSA on CPAP      Hospital Course: Ariana Walsh is a 51 y.o. F with no significant PMHx who presented with palpitations, chest discomfort and shortness of breath.  Has been having palpitations intermittently for some time.  On the day of admission, had an episode which her biometric device identified as Afib, and went to Urgent Care, where 12-lead ECG confirmed atrial fibrillation and was sent to the ER.     * Paroxysmal atrial fibrillation with rapid ventricular response (HCC) In the ER, chest x-ray clear, troponin normal, electrolytes normal.  She was admitted and started on a diltiazem drip.  TSH normal, echo showed normal EF and normal valves.  Fasting glucose last August normal.  CHA2DS2-VASc 1.  She was started on Eliquis.  Was evaluated by electrophysiology who gave a single dose of flecainide after which she cardioverted to normal sinus rhythm, and symptoms improved.  Discharged with metoprolol 25, Eliquis for 3 weeks, and close cardiology follow-up.             The [facility] Controlled Substances Registry was reviewed for this patient prior to discharge.   Consultants: Electrophysiology Procedures performed: Echo  Disposition: Home   DISCHARGE MEDICATION: Allergies as of 11/29/2021       Reactions   Latex         Medication List     TAKE these medications    apixaban 5 MG Tabs  tablet Commonly known as: ELIQUIS Take 1 tablet (5 mg total) by mouth 2 (two) times daily for 21 days.   metoprolol succinate 25 MG 24 hr tablet Commonly known as: Toprol XL Take 1 tablet (25 mg total) by mouth daily. Take with or immediately following a meal.   montelukast 10 MG tablet Commonly known as: SINGULAIR TAKE ONE TABLET BY MOUTH AT BEDTIME   Peak Flow Meter Devi Use as needed to check your breathing. Log your values   ProAir HFA 108 (90 Base) MCG/ACT inhaler Generic drug: albuterol USE 2 PUFFS EVERY 4 HOURS AS NEEDED FOR COUGH OR SHORTNESS OF BREATH.   Vaniqa 13.9 % cream Generic drug: Eflornithine HCl Apply to area twice a day        Follow-up Information     Loel Dubonnet, NP Follow up on 12/08/2021.   Specialty: Cardiology Why: at 8:25am for cardiology follow up Contact information: [place] [address] [phone]                 Discharge Instructions     Discharge instructions   Complete by: As directed    From Dr. Loleta Books: You were admitted with atrial fibrillation. While you were here, you had an echocardiogram, that showed normal heart function and normal heart structure, thankfully. Your thyroid test was normal Your electrolytes and renal function were normal  You should take the heart rate controlling medicine metoprolol succinate/Toprol XL 25 mg daily  Go see Laurann Montana at  Dr. Judeth Cornfield office in 9 days (see below in the "To Do" section)  Also, take the blood thinner apixaban/Eliquis 5 mg twice daily including tonight, for 21 days.   Increase activity slowly   Complete by: As directed        Discharge Exam: Filed Weights   11/28/21 1656  Weight: 77.8 kg    Pt is alert, awake, not in acute distress, interactive and appropriate Strength appears symmetric in upper and lower extremities bilaterally, speech fluent, face symmetric.  Judgment and insight appear normal.   Condition at  discharge: good  The results of significant diagnostics from this hospitalization (including imaging, microbiology, ancillary and laboratory) are listed below for reference.   Imaging Studies: DG Chest Port 1 View  Result Date: 11/28/2021 CLINICAL DATA:  Palpitations EXAM: PORTABLE CHEST 1 VIEW COMPARISON:  None Available. FINDINGS: The heart size and mediastinal contours are within normal limits. Both lungs are clear. The visualized skeletal structures are unremarkable. IMPRESSION: No active disease. Electronically Signed   By: Elmer Picker M.D.   On: 11/28/2021 17:29   ECHOCARDIOGRAM COMPLETE  Result Date: 11/29/2021    ECHOCARDIOGRAM REPORT   Patient Name:   Ariana Walsh Date of Exam: 11/29/2021 Medical Rec #:  [id number]               Height:       66.0 in Accession #:    [id number]              Weight:       171.5 lb Date of Birth:  1970-08-06               BSA:          1.873 m Patient Age:    51 years                BP:           113/76 mmHg Patient Gender: F                       HR:           75 bpm. Exam Location:  Inpatient Procedure: 2D Echo, Cardiac Doppler and Color Doppler Indications:    Afib  History:        Patient has no prior history of Echocardiogram examinations.                 Risk Factors:Sleep Apnea.  Sonographer:    Jyl Heinz Referring Phys: [id number] [place]  1. Mitral annulus diastolic velocities suggest normal diastolic function. Left ventricular ejection fraction, by estimation, is 60 to 65%. The left ventricle has normal function. The left ventricle has no regional wall motion abnormalities. Left ventricular diastolic function could not be evaluated.  2. Right ventricular systolic function is normal. The right ventricular size is normal. There is normal pulmonary artery systolic pressure. The estimated right ventricular systolic pressure is [id number] mmHg.  3. The mitral valve is normal in structure. No evidence of mitral valve  regurgitation. No evidence of mitral stenosis.  4. The aortic valve is tricuspid. Aortic valve regurgitation is not visualized. No aortic stenosis is present.  5. The inferior vena cava is normal in size with greater than 50% respiratory variability, suggesting right atrial pressure of 3 mmHg. FINDINGS  Left Ventricle: Mitral annulus diastolic velocities suggest normal diastolic function. Left ventricular ejection fraction, by estimation, is 60 to 65%. The left ventricle has normal function.  The left ventricle has no regional wall motion abnormalities.  The left ventricular internal cavity size was normal in size. There is no left ventricular hypertrophy. Left ventricular diastolic function could not be evaluated due to atrial fibrillation. Left ventricular diastolic function could not be evaluated. Right Ventricle: The right ventricular size is normal. No increase in right ventricular wall thickness. Right ventricular systolic function is normal. There is normal pulmonary artery systolic pressure. The tricuspid regurgitant velocity is 2.19 m/s, and  with an assumed right atrial pressure of 3 mmHg, the estimated right ventricular systolic pressure is 22.2 mmHg. Left Atrium: Left atrial size was normal in size. Right Atrium: Right atrial size was normal in size. Pericardium: There is no evidence of pericardial effusion. Mitral Valve: The mitral valve is normal in structure. No evidence of mitral valve regurgitation. No evidence of mitral valve stenosis. Tricuspid Valve: The tricuspid valve is normal in structure. Tricuspid valve regurgitation is not demonstrated. No evidence of tricuspid stenosis. Aortic Valve: The aortic valve is tricuspid. Aortic valve regurgitation is not visualized. No aortic stenosis is present. Aortic valve peak gradient measures 9.5 mmHg. Pulmonic Valve: The pulmonic valve was normal in structure. Pulmonic valve regurgitation is not visualized. No evidence of pulmonic stenosis. Aorta: The  aortic root is normal in size and structure. Venous: The inferior vena cava is normal in size with greater than 50% respiratory variability, suggesting right atrial pressure of 3 mmHg. IAS/Shunts: No atrial level shunt detected by color flow Doppler.  LEFT VENTRICLE PLAX 2D LVIDd:         4.60 cm     Diastology LVIDs:         2.70 cm     LV e' medial:    11.80 cm/s LV PW:         1.20 cm     LV E/e' medial:  6.3 LV IVS:        0.90 cm     LV e' lateral:   14.70 cm/s LVOT diam:     2.00 cm     LV E/e' lateral: 5.0 LV SV:         57 LV SV Index:   31 LVOT Area:     3.14 cm  LV Volumes (MOD) LV vol d, MOD A2C: 74.6 ml LV vol d, MOD A4C: 76.9 ml LV vol s, MOD A2C: 32.4 ml LV vol s, MOD A4C: 32.0 ml LV SV MOD A2C:     42.2 ml LV SV MOD A4C:     76.9 ml LV SV MOD BP:      45.5 ml RIGHT VENTRICLE             IVC RV Basal diam:  2.50 cm     IVC diam: 2.00 cm RV Mid diam:    1.80 cm RV S prime:     13.30 cm/s TAPSE (M-mode): 1.8 cm LEFT ATRIUM             Index        RIGHT ATRIUM           Index LA diam:        3.50 cm 1.87 cm/m   RA Area:     11.40 cm LA Vol (A2C):   32.0 ml 17.08 ml/m  RA Volume:   23.30 ml  12.44 ml/m LA Vol (A4C):   31.9 ml 17.03 ml/m LA Biplane Vol: 34.6 ml 18.47 ml/m  AORTIC VALVE AV Area (Vmax): 2.22 cm AV Vmax:  154.00 cm/s AV Peak Grad:   9.5 mmHg LVOT Vmax:      109.00 cm/s LVOT Vmean:     70.000 cm/s LVOT VTI:       0.183 m  AORTA Ao Root diam: 2.50 cm Ao Asc diam:  2.70 cm MITRAL VALVE               TRICUSPID VALVE MV Area (PHT): 3.31 cm    TR Peak grad:   19.2 mmHg MV Decel Time: 229 msec    TR Vmax:        219.00 cm/s MV E velocity: 73.90 cm/s                            SHUNTS                            Systemic VTI:  0.18 m                            Systemic Diam: 2.00 cm Sanda Klein MD Electronically signed by Sanda Klein MD Signature Date/Time: 11/29/2021/12:20:01 PM    Final     Microbiology: Results for orders placed or performed during the hospital encounter of  11/28/21  MRSA Next Gen by PCR, Nasal     Status: None   Collection Time: 11/29/21  1:15 AM   Specimen: Nasal Mucosa; Nasal Swab  Result Value Ref Range Status   MRSA by PCR Next Gen NOT DETECTED NOT DETECTED Final    Comment: (NOTE) The GeneXpert MRSA Assay (FDA approved for NASAL specimens only), is one component of a comprehensive MRSA colonization surveillance program. It is not intended to diagnose MRSA infection nor to guide or monitor treatment for MRSA infections. Test performance is not FDA approved in patients less than 62 years old. Performed at [facility], [place] [address], [place],  [address]     Labs: CBC: Recent Labs  Lab 11/28/21 1712 11/29/21 0508  WBC 6.5 5.7  NEUTROABS  --  2.9  HGB 14.4 15.0  HCT 41.6 40.2  MCV 93.3 91.8  PLT 270 [id number]   Basic Metabolic Panel: Recent Labs  Lab 11/28/21 1712 11/29/21 0508  NA 142 138  K 3.7 3.5  CL 104 106  CO2 27 23  GLUCOSE 97 111*  BUN 13 10  CREATININE 0.77 0.61  CALCIUM 9.8 9.4  MG  --  1.9   Liver Function Tests: Recent Labs  Lab 11/29/21 0508  AST 19  ALT 26  ALKPHOS 53  BILITOT 0.7  PROT 6.9  ALBUMIN 4.2   CBG: No results for input(s): GLUCAP in the last 168 hours.  Discharge time spent: approximately 20 minutes spent on discharge counseling, evaluation of patient on day of discharge, and coordination of discharge planning with nursing, social work, pharmacy and case management  Signed: Edwin Dada, MD [facility] 11/29/2021

## 2021-11-29 NOTE — Progress Notes (Signed)
Pt got discharged to home, discharge instructions provided and patient showed understanding to it, IV taken out,Telemonitor DC,pt left unit in wheelchair with all of the belongings accompanied with a family member (Husband) Maryanne Huneycutt,RN  

## 2021-11-30 NOTE — Therapy (Unsigned)
OUTPATIENT PHYSICAL THERAPY TREATMENT NOTE   Patient Name: Ariana Walsh MRN: [id number] DOB:10/01/1970, 51 y.o., female Today's Date: 12/01/2021  PCP: Andy, Camille L, MD REFERRING PROVIDER: Andy, Camille L, MD  END OF SESSION:   PT End of Session - 12/01/21 1539     Visit Number 5    Date for PT Re-Evaluation 12/24/21    Authorization Type cigna    PT Start Time 1534    PT Stop Time 1612    PT Time Calculation (min) 38 min    Activity Tolerance Patient tolerated treatment well    Behavior During Therapy WFL for tasks assessed/performed                Past Medical History:  Diagnosis Date   DJD (degenerative joint disease), lumbar 11/22/2019   Xray 11/2019   Exercise-induced asthma 09/21/2018   Allergy induced also   Hernia, umbilical    Osteoarthritis, hip, bilateral 11/22/2019   xrays 11/2019   Vitamin B12 deficiency 11/20/2019   Past Surgical History:  Procedure Laterality Date   DENTAL SURGERY     HERNIA REPAIR     IVF     egg collection   Patient Active Problem List   Diagnosis Date Noted   Paroxysmal atrial fibrillation with rapid ventricular response (HCC) 11/29/2021   Fatty infiltration of liver 11/11/2021   Personal history of COVID-19 11/11/2021   Palpitations 11/11/2021   Osteoarthritis of spine with radiculopathy, cervical region 08/28/2021   Osteoarthritis of right acromioclavicular joint 08/28/2021   Rotator cuff arthropathy of right shoulder 08/28/2021   Neuroforaminal stenosis of cervical spine 08/28/2021   Family history of coronary artery disease 08/13/2021   Obesity (BMI 30-39.9) 01/26/2021   Keratosis pilaris 01/26/2021   Mild intermittent asthma 06/02/2020   DJD (degenerative joint disease), lumbar 11/22/2019   Osteoarthritis, hip, bilateral 11/22/2019   Vitamin B12 deficiency 11/20/2019   Vegetarian 11/20/2019   OSA on CPAP 10/04/2019   Exercise-induced asthma 09/21/2018   Perimenopausal symptoms 02/17/2018   Umbilical  hernia 12/30/2016   Dystrophic nail 12/30/2016    REFERRING DIAG:  Z87.19 (ICD-10-CM) - Personal history of other diseases of the digestive system  Z98.890,Z87.19 (ICD-10-CM) - H/O umbilical hernia repair    THERAPY DIAG:  Muscle weakness (generalized)  Abnormal posture  PERTINENT HISTORY: Hernia since 2017 and surgery in December 2022  PRECAUTIONS: None  SUBJECTIVE: I had to go to the ER due to a-fib this weekend so exercises not done.  Started PT for the shoulder.    PAIN:  Are you having pain? No   MUSCLE LENGTH: Hamstrings: Right 75 deg; Left 75 deg     LUMBAR SPECIAL TESTS:  ASLR has instability with Lt LE   FUNCTIONAL TESTS:  SLS trendelenburg on Lt LE Squat normal   GAIT:   Comments: WFL   POSTURE:  Anterior pelvic tilt; rounded shoulders   LUMBARAROM/PROM   A/PROM A/PROM  10/01/2021  Flexion 80%  Extension    Right lateral flexion    Left lateral flexion    Right rotation    Left rotation     (Blank rows = not tested)   LE ROM:   Passive ROM Right 10/01/2021 Left 10/01/2021  Hip flexion      Hip extension      Hip abduction      Hip adduction      Hip internal rotation normal 90%  Hip external rotation normal 75%  Knee flexion      Knee   extension      Ankle dorsiflexion      Ankle plantarflexion      Ankle inversion      Ankle eversion       (Blank rows = not tested)   LE MMT:   MMT Right 10/01/2021 Left 10/01/2021  Hip flexion      Hip extension 4/5 4/5  Hip abduction 5/5 4/5  Hip adduction 4+/5 4/5  Hip internal rotation      Hip external rotation      Knee flexion      Knee extension      Ankle dorsiflexion      Ankle plantarflexion      Ankle inversion      Ankle eversion          (Blank rows = not tested)         PALPATION:   General  - lumbar and thoracic erectors tight; umbilical incision scar tight and TTP; Lt transversus abdominus less active                 External Perineal Exam - able to contract and relax  correctly                             Internal Pelvic Floor-  deferred       TODAY'S TREATMENT  Treatment: 12/01/21 Exercises  Half kneel rotation - yellow 20x Side step pallof press yellow - 20x  Flexion on step - 20x Wood pecker - 4lb - 20x  Foam roller behind back in supine - UE flutters; UE alt, LE marching 10x each Half kneel Lunge position - UE reaching to the sky both ways - breathing into the ribcage  Manual: Fascial release around the incision  Treatment: 11/24/21 Exercises  Woodpecker - 10x each side Side stepping blue loop 3 x across mirror Supine on foam roll - knee fallout, march, UE alt - 20x each  Manual: Fascial release around the incision  Treatment: 11/19/21 Exercises  Woodpecker - 10x each side Side stepping blue loop 3 x across mirror Sidelying hip abduction toe fwd and toe down Standing blue loop glute min and glutes Leaning on chair hip ext      PATIENT EDUCATION:  Education details: Access Code: [email or web address]  Person educated: Patient Education method: Explanation, Demonstration, Corporate treasurer cues, Verbal cues, and Handouts Education comprehension: verbalized understanding     HOME EXERCISE PROGRAM:   Access Code: [email or web address] URL: [email or web address] Date: 11/03/2021 Prepared by: Jari Favre  Exercises - Small Range Straight Leg Raise  - 1 x daily - 7 x weekly - 3 sets - 10 reps - Thoracic rotation bow and arrow stretch  - 1 x daily - 7 x weekly - 3 sets - 10 reps - Quadruped Alternating Leg Extensions  - 1 x daily - 7 x weekly - 3 sets - 10 reps - Sidelying Hip Abduction  - 1 x daily - 7 x weekly - 3 sets - 10 reps - Primal Push Up with Foam Roll  - 1 x daily - 7 x weekly - 3 sets - 10 reps     ASSESSMENT:   CLINICAL IMPRESSION: Pt was able to progress core strength and added rotation.  She did get a little sensation of pulling where the hernia was with half kneel rotation initially, but was able to stabilize and did  not feel it again. Pt still benefitting from fascial release around incision for  improved soft tissue response to exercise.  Pt will benefit from skilled PT to address all above mentioned impairments and progress core strength in order to safely return to maximum function.     OBJECTIVE IMPAIRMENTS decreased activity tolerance, decreased coordination, decreased endurance, decreased strength, increased fascial restrictions, and pain.    ACTIVITY LIMITATIONS community activity and exercise .    PERSONAL FACTORS 1-2 comorbidities: difficult delivery; hernia repair  are also affecting patient's functional outcome.      REHAB POTENTIAL: Excellent   CLINICAL DECISION MAKING: Stable/uncomplicated   EVALUATION COMPLEXITY: Low     GOALS: Goals reviewed with patient? Yes   SHORT TERM GOALS: Target date: 10/29/2021   Ind with initial HEP Baseline:  Goal status: met   2.  Pt will report 25% less pain around the incision due to improved soft tissue mobility Baseline:  Goal status: met   LONG TERM GOALS: Target date: 12/24/2021   Pt will be independent with advanced HEP to maintain improvements made throughout therapy  Baseline:  Goal status: ongoing   2.  Pt will report 80% reduction of pain due to improvements in posture, strength, and muscle length   Baseline: 90% 11/24/21  Goal status: met   3.  Pt will be able to confidently and safely return to yoga due to improved core strength Baseline: has not done since my shoulder is hurting Goal status: ongoing   4.  Pt will have 5/5 MMT of bil hip strength for improved stability of trunk in single leg activities Baseline: progressing with hip strengthening exercises Goal status: ongoing     PLAN: PT FREQUENCY: 1x/week   PT DURATION: 12 weeks   PLANNED INTERVENTIONS: Therapeutic exercises, Therapeutic activity, Neuromuscular re-education, Balance training, Gait training, Patient/Family education, Joint mobilization, Dry Needling,  Electrical stimulation, Cryotherapy, Moist heat, scar mobilization, Taping, Biofeedback, and Manual therapy   PLAN FOR NEXT SESSION: progress core strength on the foam roller, pelvic stability in single leg and half kneel; look at complete spinal stability    Camillo Flaming Annalei Friesz, PT 12/01/2021, 3:44 PM

## 2021-12-01 ENCOUNTER — Encounter: Payer: Self-pay | Admitting: Physical Therapy

## 2021-12-01 ENCOUNTER — Ambulatory Visit: Payer: Managed Care, Other (non HMO) | Admitting: Physical Therapy

## 2021-12-01 DIAGNOSIS — R293 Abnormal posture: Secondary | ICD-10-CM

## 2021-12-01 DIAGNOSIS — M6281 Muscle weakness (generalized): Secondary | ICD-10-CM | POA: Diagnosis not present

## 2021-12-07 NOTE — Progress Notes (Unsigned)
Office Visit    Patient Name: Ariana Walsh [facility] Date of Encounter: 12/08/2021  PCP:  [name], [place]  Cardiologist:  Buford Dresser, MD  Advanced Practice Provider:  No care team member to display Electrophysiologist:  None   Chief Complaint    Louvella Bentham Iida is a 51 y.o. female with a hx of atrial fibrillation, OSA presents today for hospital follow up.    Past Medical History    Past Medical History:  Diagnosis Date   DJD (degenerative joint disease), lumbar 11/22/2019   Xray 11/2019   Exercise-induced asthma 09/21/2018   Allergy induced also   Hernia, umbilical    Osteoarthritis, hip, bilateral 11/22/2019   xrays 11/2019   Vitamin B12 deficiency 11/20/2019   Past Surgical History:  Procedure Laterality Date   DENTAL SURGERY     HERNIA REPAIR     IVF     egg collection    Allergies  Allergies  Allergen Reactions   Latex     History of Present Illness    Aaditri Chavez is a 51 y.o. female with a hx of atrial fibrillation, OSA, DJD, asthma, osteoarthritis last seen while hospitalized. Fmaily history notable for coronary artery disease.   Onset of palpitations after COVID-19 infection 02/2021.  She wore 3-day event monitor 03/03/2021 with predominantly NSR, less than 1% PVC/PAC, no atrial fibrillation, 9 episodes of SVT. When last seen 11/11/21 by Dr. Harrell Gave she was doing well from a cardiac perspective.  Given her family history coronary calcium score was ordered.  Hospitalized 5/20 - 11/30/2019 after presenting with palpitations, chest discomfort, shortness of breath.  EKG revealed atrial fibrillation.  CBC, BMP, D-dimer, TSH, CXR unremarkable.  He was started on diltiazem drip.  Echo with normal LVEF, no significant valvular abnormalities.  She was started on Eliquis.  Seen in consult by Dr. Lovena Le, electrophysiology, and give single dose of flecainide after which time she converted to NSR.  She  was discharged on metoprolol succinate 25 mg, Eliquis for 3 weeks.  She presents today for follow-up.  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  Feeling well since discharge. She was able to hike 6 miles Sunday with only mild dyspnea on exertion with initial hill which she attributes to Metoprolol. Her previous atrial fibrillation has been symptomatic with palpitations. Notes prior to most recent episode she had increased caffeine and alcohol intake the week prior.  She usually drinks decaf espresso and 1 cup of caffeinated tea in the morning but that weekend had an additional cup of coffee each day.  Usually 1 alcoholic beverage once per week but that week had 3 alcoholic beverages.  Does note her palpitations have previously been better controlled and she is avoiding caffeine.  She also wonders if her hormones may be contributory as she is nearing 1 year without a menstrual cycle.  We reviewed triggers for atrial fibrillation.  We reviewed pathophysiology and treatment plans for atrial fibrillation including AV nodal blocking therapy, AAD, ablation.  Since hospitalization she has learned that she has history of atrial fibrillation in her grandfather and 2 of her great aunts and her mother's paternal side.  EKGs/Labs/Other Studies Reviewed:   The following studies were reviewed today:  Echo 11/29/21  1. Mitral annulus diastolic velocities suggest normal diastolic function.  Left ventricular ejection fraction, by estimation, is 60 to 65%. The left  ventricle has normal function. The left ventricle  has no regional wall  motion abnormalities. Left  ventricular diastolic function could not be evaluated.   2. Right ventricular systolic function is normal. The right ventricular  size is normal. There is normal pulmonary artery systolic pressure. The  estimated right ventricular systolic pressure is [id number] mmHg.   3. The mitral valve is normal in structure. No evidence of mitral  valve  regurgitation. No evidence of mitral stenosis.   4. The aortic valve is tricuspid. Aortic valve regurgitation is not  visualized. No aortic stenosis is present.   5. The inferior vena cava is normal in size with greater than 50%  respiratory variability, suggesting right atrial pressure of 3 mmHg.   EKG:  EKG is ordered today.  The ekg ordered today demonstrates SB 57 bpm with no acute ST/T wave changes.   Recent Labs: 11/29/2021: ALT 26; B Natriuretic Peptide 208.3; BUN 10; Creatinine, Ser 0.61; Hemoglobin 15.0; Magnesium 1.9; Platelets 267; Potassium 3.5; Sodium 138; TSH 3.785  Recent Lipid Panel    Component Value Date/Time   CHOL 154 01/26/2021 1401   TRIG 156.0 (H) 01/26/2021 1401   HDL 44.10 01/26/2021 1401   CHOLHDL 3 01/26/2021 1401   VLDL 31.2 01/26/2021 1401   LDLCALC 79 01/26/2021 1401    Risk Assessment/Calculations:   CHA2DS2-VASc Score = 1   This indicates a 0.6% annual risk of stroke. The patient's score is based upon: CHF History: 0 HTN History: 0 Diabetes History: 0 Stroke History: 0 Vascular Disease History: 0 Age Score: 0 Gender Score: 1      Home Medications   Current Meds  Medication Sig   apixaban (ELIQUIS) 5 MG TABS tablet Take 1 tablet (5 mg total) by mouth 2 (two) times daily for 21 days.   metoprolol tartrate (LOPRESSOR) 25 MG tablet Take 1 tablet (25 mg total) by mouth as needed (atrial fibrillation, palpitations).   montelukast (SINGULAIR) 10 MG tablet TAKE ONE TABLET BY MOUTH AT BEDTIME   [DISCONTINUED] metoprolol succinate (TOPROL XL) 25 MG 24 hr tablet Take 1 tablet (25 mg total) by mouth daily. Take with or immediately following a meal.     Review of Systems      All other systems reviewed and are otherwise negative except as noted above.  Physical Exam    VS:  BP 118/72   Pulse (!) 57   Ht 5\' 6"  (1.676 m)   Wt 176 lb 4.8 oz (80 kg)   BMI 28.46 kg/m  , BMI Body mass index is 28.46 kg/m.  Wt Readings from Last 3  Encounters:  12/08/21 176 lb 4.8 oz (80 kg)  11/28/21 171 lb 8.3 oz (77.8 kg)  11/11/21 171 lb 8 oz (77.8 kg)    GEN: Well nourished, well developed, in no acute distress. HEENT: normal. Neck: Supple, no JVD, carotid bruits, or masses. Cardiac: RRR, no murmurs, rubs, or gallops. No clubbing, cyanosis, edema.  Radials/PT 2+ and equal bilaterally.  Respiratory:  Respirations regular and unlabored, clear to auscultation bilaterally. GI: Soft, nontender, nondistended. MS: No deformity or atrophy. Skin: Warm and dry, no rash. Neuro:  Strength and sensation are intact. Psych: Normal affect.  Assessment & Plan    PAF / Palpitations / SVT - Prior monitor 2022 with short episodes of SVT. Recent admission with atrial fibrillation which converted after one dose of Flecainide. Discharged on Metoprolol and Eliquis x 3 weeks due to CHADS2VASc of 1. EKG today sinus bradycardia 57bpm. Echo 11/2021 normal LVEF, no significant valvular abnormalities.  11/2021 normal TSH.  Compliant with her CPAP.  Does have family history notable for atrial fibrillation. Reduce Metoprolol Succinate to 12.5mg  QD due to bradycardia. Start Metoprolol Tartrate 25mg  PRN for atrial fibrillation. Refer to EP, Dr. Lovena Le, for discussion of AAD vs possible ablation. Could consider possible PRN Flecainide in the future but will defer until after her upcoming coronary calcium score.  Educated to reduce caffeine intake (her most significant trigger), limit alcohol, exercise regularly, manage stress well. Offered referral to PREP exercise program but she prefers to wait until after summer travels.   OSA - CPAP compliance encouraged. Wears her CPAP every evening.   Family history of coronary disease - Coronary calcium score upcoming 12/30/21. No recent anginal symptoms.   Asthma - Continue to follow with PCP.   Disposition: Follow up in 3-4 month(s) with Buford Dresser, MD or APP.  Signed, Loel Dubonnet, NP 12/08/2021, 10:08  AM [place]

## 2021-12-08 ENCOUNTER — Encounter (HOSPITAL_BASED_OUTPATIENT_CLINIC_OR_DEPARTMENT_OTHER): Payer: Self-pay | Admitting: Family

## 2021-12-08 ENCOUNTER — Telehealth: Payer: Self-pay | Admitting: Sports Medicine

## 2021-12-08 ENCOUNTER — Ambulatory Visit (INDEPENDENT_AMBULATORY_CARE_PROVIDER_SITE_OTHER): Payer: Managed Care, Other (non HMO) | Admitting: Family

## 2021-12-08 VITALS — BP 118/72 | HR 57 | Ht 66.0 in | Wt 176.3 lb

## 2021-12-08 DIAGNOSIS — R002 Palpitations: Secondary | ICD-10-CM | POA: Diagnosis not present

## 2021-12-08 DIAGNOSIS — G4733 Obstructive sleep apnea (adult) (pediatric): Secondary | ICD-10-CM

## 2021-12-08 DIAGNOSIS — I480 Paroxysmal atrial fibrillation: Secondary | ICD-10-CM

## 2021-12-08 DIAGNOSIS — Z8249 Family history of ischemic heart disease and other diseases of the circulatory system: Secondary | ICD-10-CM | POA: Diagnosis not present

## 2021-12-08 MED ORDER — METOPROLOL SUCCINATE ER 25 MG PO TB24: 12.5000 mg | ORAL_TABLET | Freq: Every day | ORAL | 1 refills | Status: DC

## 2021-12-08 MED ORDER — METOPROLOL TARTRATE 25 MG PO TABS: 25.0000 mg | ORAL_TABLET | ORAL | 1 refills | Status: DC | PRN

## 2021-12-08 NOTE — Patient Instructions (Addendum)
Medication Instructions:  Your physician has recommended you make the following change in your medication:   REDUCE your Metoprolol to half tablet (12.5mg ) daily  START Metoprolol Tartrate 25mg  as needed for breakthrough palpitations Take for heart rate that is more than 110bpm at rest or if your [name] shows atrial fibrillation If atrial fibrillation does not resolve in one hour please either call for an appointment if during office hours or present to urgent care or ER for evaluation  Continue your Eliquis until you run out of tablets then discontinue  *If you need a refill on your cardiac medications before your next appointment, please call your pharmacy*  Lab Work: None ordered today.   Testing/Procedures: Your EKG today showed sinus bradycardia which is a regular but slow heart rhythm. This is a good result.   Follow-Up: At [facility], you and your health needs are our priority.  As part of our continuing mission to provide you with exceptional heart care, we have created designated Provider Care Teams.  These Care Teams include your primary Cardiologist (physician) and Advanced Practice Providers (APPs -  Physician Assistants and Nurse Practitioners) who all work together to provide you with the care you need, when you need it.  We recommend signing up for the patient portal called "MyChart".  Sign up information is provided on this After Visit Summary.  MyChart is used to connect with patients for Virtual Visits (Telemedicine).  Patients are able to view lab/test results, encounter notes, upcoming appointments, etc.  Non-urgent messages can be sent to your provider as well.   To learn more about what you can do with MyChart, go to [email or web address].    Your next appointment:   3-4 month(s)  The format for your next appointment:   In Person  Provider:   Buford Dresser, MD or Loel Dubonnet, NP    Other Instructions  To prevent palpitations: Make sure you  are adequately hydrated.  Avoid and/or limit caffeine containing beverages like soda or tea. Exercise regularly.  Manage stress well. Some over the counter medications can cause palpitations such as Benadryl, AdvilPM, TylenolPM. Regular Advil or Tylenol do not cause palpitations.    If you decide you want to participate in the PREP exercise program just send [place] a MyChart message and let us know.  Atrial Fibrillation  Atrial fibrillation is a type of heartbeat that is irregular or fast. If you have this condition, your heart beats without any order. This makes it hard for your heart to pump blood in a normal way. Atrial fibrillation may come and go, or it may become a long-lasting problem. If this condition is not treated, it can put you at higher risk for stroke, heart failure, and other heart problems. What are the causes? This condition may be caused by diseases that damage the heart. They include: High blood pressure. Heart failure. Heart valve disease. Heart surgery. Other causes include: Diabetes. Thyroid disease. Being overweight. Kidney disease. Sometimes the cause is not known. What increases the risk? You are more likely to develop this condition if: You are older. You smoke. You exercise often and very hard. You have a family history of this condition. You are a man. You use drugs. You drink a lot of alcohol. You have lung conditions, such as emphysema, pneumonia, or COPD. You have sleep apnea. What are the signs or symptoms? Common symptoms of this condition include: A feeling that your heart is beating very fast. Chest pain or discomfort. Feeling short  of breath. Suddenly feeling light-headed or weak. Getting tired easily during activity. Fainting. Sweating. In some cases, there are no symptoms. How is this treated? Treatment for this condition depends on underlying conditions and how you feel when you have atrial fibrillation. They include: Medicines  to: Prevent blood clots. Treat heart rate or heart rhythm problems. Using devices, such as a pacemaker, to correct heart rhythm problems. Doing surgery to remove the part of the heart that sends bad signals. Closing an area where clots can form in the heart (left atrial appendage). In some cases, your doctor will treat other underlying conditions. Follow these instructions at home: Medicines Take over-the-counter and prescription medicines only as told by your doctor. Do not take any new medicines without first talking to your doctor. If you are taking blood thinners: Talk with your doctor before you take any medicines that have aspirin or NSAIDs, such as ibuprofen, in them. Take your medicine exactly as told by your doctor. Take it at the same time each day. Avoid activities that could hurt or bruise you. Follow instructions about how to prevent falls. Wear a bracelet that says you are taking blood thinners. Or, carry a card that lists what medicines you take. Lifestyle  Do not use any products that have nicotine or tobacco in them. These include cigarettes, e-cigarettes, and chewing tobacco. If you need help quitting, ask your doctor. Eat heart-healthy foods. Talk with your doctor about the right eating plan for you. Exercise regularly as told by your doctor. Limit drink alcohol. Lose weight if you are overweight. Do not use drugs, including cannabis. General instructions If you have a condition that causes breathing to stop for a short period of time (apnea), treat it as told by your doctor. Keep a healthy weight. Do not use diet pills unless your doctor says they are safe for you. Diet pills may make heart problems worse. Keep all follow-up visits as told by your doctor. This is important. Contact a doctor if: You notice a change in the speed, rhythm, or strength of your heartbeat. You are taking a blood-thinning medicine and you get more bruising. You get tired more easily when  you move or exercise. You have a sudden change in weight. Get help right away if:  You have pain in your chest or your belly (abdomen). You have trouble breathing. You have side effects of blood thinners, such as blood in your vomit, poop (stool), or pee (urine), or bleeding that cannot stop. You have any signs of a stroke. "BE FAST" is an easy way to remember the main warning signs: B - Balance. Signs are dizziness, sudden trouble walking, or loss of balance. E - Eyes. Signs are trouble seeing or a change in how you see. F - Face. Signs are sudden weakness or loss of feeling in the face, or the face or eyelid drooping on one side. A - Arms. Signs are weakness or loss of feeling in an arm. This happens suddenly and usually on one side of the body. S - Speech. Signs are sudden trouble speaking, slurred speech, or trouble understanding what people say. T - Time. Time to call emergency services. Write down what time symptoms started. You have other signs of a stroke, such as: A sudden, very bad headache with no known cause. Feeling like you may vomit (nausea). Vomiting. A seizure. These symptoms may be an emergency. Do not wait to see if the symptoms will go away. Get medical help right away. Call  your local emergency services (911 in the [place] Do not drive yourself to the hospital. Summary Atrial fibrillation is a type of heartbeat that is irregular or fast. You are at higher risk of this condition if you smoke, are older, have diabetes, or are overweight. Follow your doctor's instructions about medicines, diet, exercise, and follow-up visits. Get help right away if you have signs or symptoms of a stroke. Get help right away if you cannot catch your breath, or you have chest pain or discomfort. This information is not intended to replace advice given to you by your health care provider. Make sure you discuss any questions you have with your health care provider. Document Revised: 12/20/2018  Document Reviewed: 12/20/2018 Elsevier Patient Education  2023 Elsevier Inc.  Cardiac Ablation Cardiac ablation is a procedure to destroy (ablate) some heart tissue that is sending bad signals. These bad signals cause problems in heart rhythm. The heart has many areas that make these signals. If there are problems in these areas, they can make the heart beat in a way that is not normal. Destroying some tissues can help make the heart rhythm normal. Tell your doctor about: Any allergies you have. All medicines you are taking. These include vitamins, herbs, eye drops, creams, and over-the-counter medicines. Any problems you or family members have had with medicines that make you fall asleep (anesthetics). Any blood disorders you have. Any surgeries you have had. Any medical conditions you have, such as kidney failure. Whether you are pregnant or may be pregnant. What are the risks? This is a safe procedure. But problems may occur, including: Infection. Bruising and bleeding. Bleeding into the chest. Stroke or blood clots. Damage to nearby areas of your body. Allergies to medicines or dyes. The need for a pacemaker if the normal system is damaged. Failure of the procedure to treat the problem. What happens before the procedure? Medicines Ask your doctor about: Changing or stopping your normal medicines. This is important. Taking aspirin and ibuprofen. Do not take these medicines unless your doctor tells you to take them. Taking other medicines, vitamins, herbs, and supplements. General instructions Follow instructions from your doctor about what you cannot eat or drink. Plan to have someone take you home from the hospital or clinic. If you will be going home right after the procedure, plan to have someone with you for 24 hours. Ask your doctor what steps will be taken to prevent infection. What happens during the procedure?  An IV tube will be put into one of your veins. You will  be given a medicine to help you relax. The skin on your neck or groin will be numbed. A cut (incision) will be made in your neck or groin. A needle will be put through your cut and into a large vein. A tube (catheter) will be put into the needle. The tube will be moved to your heart. Dye may be put through the tube. This helps your doctor see your heart. Small devices (electrodes) on the tube will send out signals. A type of energy will be used to destroy some heart tissue. The tube will be taken out. Pressure will be held on your cut. This helps stop bleeding. A bandage will be put over your cut. The exact procedure may vary among doctors and hospitals. What happens after the procedure? You will be watched until you leave the hospital or clinic. This includes checking your heart rate, breathing rate, oxygen, and blood pressure. Your cut will be watched  for bleeding. You will need to lie still for a few hours. Do not drive for 24 hours or as long as your doctor tells you. Summary Cardiac ablation is a procedure to destroy some heart tissue. This is done to treat heart rhythm problems. Tell your doctor about any medical conditions you may have. Tell him or her about all medicines you are taking to treat them. This is a safe procedure. But problems may occur. These include infection, bruising, bleeding, and damage to nearby areas of your body. Follow what your doctor tells you about food and drink. You may also be told to change or stop some of your medicines. After the procedure, do not drive for 24 hours or as long as your doctor tells you. This information is not intended to replace advice given to you by your health care provider. Make sure you discuss any questions you have with your health care provider. Document Revised: 05/31/2019 Document Reviewed: 05/31/2019 Elsevier Patient Education  [place].

## 2021-12-08 NOTE — Telephone Encounter (Signed)
Patient is a current patient of Dr. Marylene Land. She previously had toenail fungus and she wanted to know if she could continue to get a refill through [employer]- its tea tree and urea combo and not have to come back in since she currently does not have toenail fungus. Also if she can't continue to get refills would she need to come in an be re-evaluated or get an OTC medication.  Patient is trying to prevent toenail fungus from coming back.

## 2021-12-11 ENCOUNTER — Encounter: Payer: Self-pay | Admitting: Physical Therapy

## 2021-12-11 ENCOUNTER — Ambulatory Visit: Payer: Managed Care, Other (non HMO) | Attending: General Surgery | Admitting: Physical Therapy

## 2021-12-11 DIAGNOSIS — R293 Abnormal posture: Secondary | ICD-10-CM | POA: Diagnosis present

## 2021-12-11 DIAGNOSIS — M6281 Muscle weakness (generalized): Secondary | ICD-10-CM | POA: Diagnosis present

## 2021-12-11 NOTE — Therapy (Signed)
OUTPATIENT PHYSICAL THERAPY TREATMENT NOTE   Patient Name: Ariana Walsh MRN: [id number] DOB:December 19, 1970, 51 y.o., female Today's Date: 12/11/2021  PCP: [name], MD REFERRING PROVIDER: Greer Pickerel, MD  END OF SESSION:   PT End of Session - 12/11/21 1054     Visit Number 6    Date for PT Re-Evaluation 12/24/21    Authorization Type cigna    PT Start Time 1054    PT Stop Time 1135    PT Time Calculation (min) 41 min    Activity Tolerance Patient tolerated treatment well    Behavior During Therapy [facility] for tasks assessed/performed                Past Medical History:  Diagnosis Date   DJD (degenerative joint disease), lumbar 11/22/2019   Xray 11/2019   Exercise-induced asthma 09/21/2018   Allergy induced also   Hernia, umbilical    Osteoarthritis, hip, bilateral 11/22/2019   xrays 11/2019   Vitamin B12 deficiency 11/20/2019   Past Surgical History:  Procedure Laterality Date   DENTAL SURGERY     HERNIA REPAIR     IVF     egg collection   Patient Active Problem List   Diagnosis Date Noted   Paroxysmal atrial fibrillation with rapid ventricular response ([place]) 11/29/2021   Fatty infiltration of liver 11/11/2021   Personal history of COVID-19 11/11/2021   Palpitations 11/11/2021   Osteoarthritis of spine with radiculopathy, cervical region 08/28/2021   Osteoarthritis of right acromioclavicular joint 08/28/2021   Rotator cuff arthropathy of right shoulder 08/28/2021   Neuroforaminal stenosis of cervical spine 08/28/2021   Family history of coronary artery disease 08/13/2021   Obesity (BMI 30-39.9) 01/26/2021   Keratosis pilaris 01/26/2021   Mild intermittent asthma 06/02/2020   DJD (degenerative joint disease), lumbar 11/22/2019   Osteoarthritis, hip, bilateral 11/22/2019   Vitamin B12 deficiency 11/20/2019   Vegetarian 11/20/2019   OSA on CPAP 10/04/2019   Exercise-induced asthma 09/21/2018   Perimenopausal symptoms 35/45/6256   Umbilical  hernia 38/93/7342   Dystrophic nail 12/30/2016    REFERRING DIAG:  Z87.19 (ICD-10-CM) - Personal history of other diseases of the digestive system  Z98.890,Z87.19 ([id number]) - H/O umbilical hernia repair    THERAPY DIAG:  Muscle weakness (generalized)  Abnormal posture  PERTINENT HISTORY: Hernia since 2017 and surgery in December 2022  PRECAUTIONS: None  SUBJECTIVE: No pain, doing well. PAIN:  Are you having pain? No   MUSCLE LENGTH: Hamstrings: Right 75 deg; Left 75 deg     LUMBAR SPECIAL TESTS:  ASLR has instability with Lt LE   FUNCTIONAL TESTS:  SLS trendelenburg on Lt LE Squat normal   GAIT:   Comments: WFL   POSTURE:  Anterior pelvic tilt; rounded shoulders   LUMBARAROM/PROM   A/PROM A/PROM  10/01/2021  Flexion 80%  Extension    Right lateral flexion    Left lateral flexion    Right rotation    Left rotation     (Blank rows = not tested)   LE ROM:   Passive ROM Right 10/01/2021 Left 10/01/2021  Hip flexion      Hip extension      Hip abduction      Hip adduction      Hip internal rotation normal 90%  Hip external rotation normal 75%  Knee flexion      Knee extension      Ankle dorsiflexion      Ankle plantarflexion      Ankle inversion  Ankle eversion       (Blank rows = not tested)   LE MMT:   MMT Right 10/01/2021 Left 10/01/2021  Hip flexion      Hip extension 4/5 4/5  Hip abduction 5/5 4/5  Hip adduction 4+/5 4/5  Hip internal rotation      Hip external rotation      Knee flexion      Knee extension      Ankle dorsiflexion      Ankle plantarflexion      Ankle inversion      Ankle eversion          (Blank rows = not tested)         PALPATION:   General  - lumbar and thoracic erectors tight; umbilical incision scar tight and TTP; Lt transversus abdominus less active                 External Perineal Exam - able to contract and relax correctly                             Internal Pelvic Floor-  deferred        TODAY'S TREATMENT   Treatment: 12/11/21: Nustep warm up L2 5 min with PTA present to discuss current status.  Supine foam roll exercises for core including: ribcage breathing in all quadrants, TA contraction 5x with Pilates breath, arm circles 5x, tiny steps 5x alteranting ( VC to reset the core each time) slow dying bug 4x Bil Instructed pt in the book The Melt method for home reference  Treatment: 12/01/21 Exercises  Half kneel rotation - yellow 20x Side step pallof press yellow - 20x  Flexion on step - 20x Wood pecker - 4lb - 20x  Foam roller behind back in supine - UE flutters; UE alt, LE marching 10x each Half kneel Lunge position - UE reaching to the sky both ways - breathing into the ribcage  Manual: Fascial release around the incision  Treatment: 11/24/21 Exercises  Woodpecker - 10x each side Side stepping blue loop 3 x across mirror Supine on foam roll - knee fallout, march, UE alt - 20x each  Manual: Fascial release around the incision  Treatment: 11/19/21 Exercises  Woodpecker - 10x each side Side stepping blue loop 3 x across mirror Sidelying hip abduction toe fwd and toe down Standing blue loop glute min and glutes Leaning on chair hip ext      PATIENT EDUCATION:  Education details: Access Code: [email or web address]  Person educated: Patient Education method: Explanation, Demonstration, Corporate treasurer cues, Verbal cues, and Handouts Education comprehension: verbalized understanding     HOME EXERCISE PROGRAM:   Access Code: [email or web address] URL: [email or web address] Date: 11/03/2021 Prepared by: Jari Favre  Exercises - Small Range Straight Leg Raise  - 1 x daily - 7 x weekly - 3 sets - 10 reps - Thoracic rotation bow and arrow stretch  - 1 x daily - 7 x weekly - 3 sets - 10 reps - Quadruped Alternating Leg Extensions  - 1 x daily - 7 x weekly - 3 sets - 10 reps - Sidelying Hip Abduction  - 1 x daily - 7 x weekly - 3 sets - 10 reps - Primal Push Up  with Foam Roll  - 1 x daily - 7 x weekly - 3 sets - 10 reps     ASSESSMENT:   CLINICAL IMPRESSION: Pt arrives pain free. She has not purchased  her foam roller yet but is in her [employer] cart ready to go. PTA educated pt on core strengthening series based on The Melt Method protocols. Pt demonstrates weakness on the Lt side of her core but could complete all exercises pain free. Verbal cues intermittently to correct core initiating from TA.    OBJECTIVE IMPAIRMENTS decreased activity tolerance, decreased coordination, decreased endurance, decreased strength, increased fascial restrictions, and pain.    ACTIVITY LIMITATIONS community activity and exercise .    PERSONAL FACTORS 1-2 comorbidities: difficult delivery; hernia repair  are also affecting patient's functional outcome.      REHAB POTENTIAL: Excellent   CLINICAL DECISION MAKING: Stable/uncomplicated   EVALUATION COMPLEXITY: Low     GOALS: Goals reviewed with patient? Yes   SHORT TERM GOALS: Target date: 10/29/2021   Ind with initial HEP Baseline:  Goal status: met   2.  Pt will report 25% less pain around the incision due to improved soft tissue mobility Baseline:  Goal status: met   LONG TERM GOALS: Target date: 12/24/2021   Pt will be independent with advanced HEP to maintain improvements made throughout therapy  Baseline:  Goal status: ongoing   2.  Pt will report 80% reduction of pain due to improvements in posture, strength, and muscle length   Baseline: 90% 11/24/21  Goal status: met   3.  Pt will be able to confidently and safely return to yoga due to improved core strength Baseline: has not done since my shoulder is hurting Goal status: ongoing   4.  Pt will have 5/5 MMT of bil hip strength for improved stability of trunk in single leg activities Baseline: progressing with hip strengthening exercises Goal status: ongoing     PLAN: PT FREQUENCY: 1x/week   PT DURATION: 12 weeks   PLANNED  INTERVENTIONS: Therapeutic exercises, Therapeutic activity, Neuromuscular re-education, Balance training, Gait training, Patient/Family education, Joint mobilization, Dry Needling, Electrical stimulation, Cryotherapy, Moist heat, scar mobilization, Taping, Biofeedback, and Manual therapy   PLAN FOR NEXT SESSION: ERO next visit.   Mckinzie Saksa,[place], PTA 12/11/2021, 11:35 AM

## 2021-12-13 NOTE — Progress Notes (Unsigned)
HPI Ariana Walsh never smoker followed for OSA, complicated by  Asthma triggered by colds and exercise. Mild Seasonal Rhinitis. HST 11/14/19- AHI 17.3/ hr, desaturation to 80%, body weight 179 lbs  ------------------------------------------------------------------------------------------   11/20/20- 50 yoF never smoker followed for OSA, complicated by  Asthma triggered by colds and exercise. Mild Seasonal Rhinitis. CPAP auto 5-15/  Adapt Download- compliance 97%, AHI 1.7/ hr Body weight today-201 lbs Covid vax-3 Phizer -----Was having allergies to new kittens, since starting singular and zyrtec that has helped. Pressure was too much, so adjusted herslf. Now not too sure if its enough Sleeping better. Likes CPAP. She adjusted her ramp- not the pressure range. Asks about travel CPAP. Discussed Covid boosters.   12/15/21- 51 yoF never smoker followed for OSA, complicated by  Asthma triggered by colds and exercise. Mild Seasonal Rhinitis, AFib,  CPAP auto 5-15/  Adapt Download- compliance 93%, AHI 1.3/ hr Body weight today-176 lbs Covid vax-4 Phizer Developed paroxysmal atrial fibrillation after COVID.  Working with cardiology.  Now on Eliquis and metoprolol. Very compliant with CPAP.  Download reviewed.  Has a battery so she can go camping.  We discussed potential association between untreated OSA and A-fib. Asthma has been comfortable and well controlled. CXR 1V 11/28/21--  IMPRESSION: No active disease.  ROS-see HPI   + = positive Constitutional:    weight loss, night sweats, fevers, chills, fatigue, lassitude. HEENT:    headaches, difficulty swallowing, tooth/dental problems, sore throat,       sneezing, itching, ear ache, nasal congestion, post nasal drip, snoring CV:    chest pain, orthopnea, PND, swelling in lower extremities, anasarca,                                   dizziness, palpitations Resp:   shortness of breath with exertion or at rest.                productive cough,    non-productive cough, coughing up of blood.              change in color of mucus.  wheezing.   Skin:    rash or lesions. GI:  No-   heartburn, indigestion, abdominal pain, nausea, vomiting, diarrhea,                 change in bowel habits, loss of appetite GU: dysuria, change in color of urine, no urgency or frequency.   flank pain. MS:   joint pain, stiffness, decreased range of motion, back pain. Neuro-     nothing unusual Psych:  change in mood or affect.  depression or anxiety.   memory loss.  OBJ- Physical Exam General- Alert, Oriented, Affect-appropriate, Distress- none acute, not obese Skin- rash-none, lesions- none, excoriation- none Lymphadenopathy- none Head- atraumatic            Eyes- Gross vision intact, PERRLA, conjunctivae and secretions clear            Ears- Hearing, canals-normal            Nose-  +Clear mucus, no-Septal dev, mucus, polyps, erosion, perforation             Throat- Mallampati II-III , mucosa clear , drainage- none, tonsils- atrophic Neck- flexible , trachea midline, no stridor , thyroid nl, carotid no bruit Chest - symmetrical excursion , unlabored           Heart/CV- RRR , no murmur ,  no gallop  , no rub, nl s1 s2                           - JVD- none , edema- none, stasis changes- none, varices- none           Lung- clear to P&A, wheeze- none, cough- none , dullness-none, rub- none           Chest wall-  Abd-  Br/ Gen/ Rectal- Not done, not indicated Extrem- cyanosis- none, clubbing, none, atrophy- none, strength- nl Neuro- grossly intact to observation

## 2021-12-14 NOTE — Telephone Encounter (Signed)
[facility] sent over the request for a refill for toenail fungus.  Please advise

## 2021-12-14 NOTE — Therapy (Unsigned)
OUTPATIENT PHYSICAL THERAPY TREATMENT NOTE   Patient Name: Ariana Walsh MRN: [id number] DOB:[birth year], 51 y.o., female Today's Date: 12/15/2021  PCP: [name], MD REFERRING PROVIDER: Greer Pickerel, MD  END OF SESSION:   PT End of Session - 12/15/21 1236     Visit Number 7    Date for PT Re-Evaluation 12/24/21    Authorization Type cigna    PT Start Time 6720    PT Stop Time 1315    PT Time Calculation (min) 40 min    Activity Tolerance Patient tolerated treatment well    Behavior During Therapy [facility] for tasks assessed/performed                 Past Medical History:  Diagnosis Date   DJD (degenerative joint disease), lumbar 11/22/2019   Xray 11/2019   Exercise-induced asthma 09/21/2018   Allergy induced also   Hernia, umbilical    Osteoarthritis, hip, bilateral 11/22/2019   xrays 11/2019   Vitamin B12 deficiency 11/20/2019   Past Surgical History:  Procedure Laterality Date   DENTAL SURGERY     HERNIA REPAIR     IVF     egg collection   Patient Active Problem List   Diagnosis Date Noted   Paroxysmal atrial fibrillation with rapid ventricular response ([place]) 11/29/2021   Fatty infiltration of liver 11/11/2021   Personal history of COVID-19 11/11/2021   Palpitations 11/11/2021   Osteoarthritis of spine with radiculopathy, cervical region 08/28/2021   Osteoarthritis of right acromioclavicular joint 08/28/2021   Rotator cuff arthropathy of right shoulder 08/28/2021   Neuroforaminal stenosis of cervical spine 08/28/2021   Family history of coronary artery disease 08/13/2021   Obesity (BMI 30-39.9) 01/26/2021   Keratosis pilaris 01/26/2021   Mild intermittent asthma 06/02/2020   DJD (degenerative joint disease), lumbar 11/22/2019   Osteoarthritis, hip, bilateral 11/22/2019   Vitamin B12 deficiency 11/20/2019   Vegetarian 11/20/2019   OSA on CPAP 10/04/2019   Exercise-induced asthma 09/21/2018   Perimenopausal symptoms 94/70/9628   Umbilical  hernia 36/62/9476   Dystrophic nail 12/30/2016    REFERRING DIAG:  Z87.19 (ICD-10-CM) - Personal history of other diseases of the digestive system  Z98.890,Z87.19 (LYY-50-PT) - H/O umbilical hernia repair    THERAPY DIAG:  No diagnosis found.  PERTINENT HISTORY: Hernia since 2017 and surgery in December 2022  PRECAUTIONS: None  SUBJECTIVE: No pain, doing well. Just concerned about when is the right time to get back to regular exercises like planks. PAIN:  Are you having pain? No   MUSCLE LENGTH: Hamstrings: Right 75 deg; Left 75 deg     LUMBAR SPECIAL TESTS:  ASLR has instability with Lt LE   FUNCTIONAL TESTS:  SLS trendelenburg on Lt LE Squat normal   GAIT:   Comments: WFL   POSTURE:  Anterior pelvic tilt; rounded shoulders   LUMBARAROM/PROM   A/PROM A/PROM  10/01/2021  Flexion 80%  Extension    Right lateral flexion    Left lateral flexion    Right rotation    Left rotation     (Blank rows = not tested)   LE ROM:   Passive ROM Right 10/01/2021 Left 10/01/2021  Hip flexion      Hip extension      Hip abduction      Hip adduction      Hip internal rotation normal 90%  Hip external rotation normal 75%  Knee flexion      Knee extension      Ankle dorsiflexion  Ankle plantarflexion      Ankle inversion      Ankle eversion       (Blank rows = not tested)   LE MMT:   MMT Right 10/01/2021 Left 10/01/2021  Hip flexion      Hip extension 4/5 4/5  Hip abduction 5/5 4/5  Hip adduction 4+/5 4/5  Hip internal rotation      Hip external rotation      Knee flexion      Knee extension      Ankle dorsiflexion      Ankle plantarflexion      Ankle inversion      Ankle eversion          (Blank rows = not tested)         PALPATION:   General  - lumbar and thoracic erectors tight; umbilical incision scar tight and TTP; Lt transversus abdominus less active                 External Perineal Exam - able to contract and relax correctly                              Internal Pelvic Floor-  deferred       TODAY'S TREATMENT  Treatment: 12/15/21: Rudean Haskell fwd on knees - alt LE lift Plank side - knees - stopped due to feeling it in shoulder  Supine foam roll exercises for core including: ribcage breathing in all quadrants, TA contraction 5x with Pilates breath, arm circles 5x, tiny steps 5x alteranting ( VC to reset the core each time), UE weighted ball overhead halfway, ball up and bent knee fall out   Treatment: 12/11/21: Nustep warm up L2 5 min with PTA present to discuss current status.  Supine foam roll exercises for core including: ribcage breathing in all quadrants, TA contraction 5x with Pilates breath, arm circles 5x, tiny steps 5x alteranting ( VC to reset the core each time) slow dying bug 4x Bil Instructed pt in the book The Melt method for home reference  Treatment: 12/01/21 Exercises  Half kneel rotation - yellow 20x Side step pallof press yellow - 20x  Flexion on step - 20x Wood pecker - 4lb - 20x  Foam roller behind back in supine - UE flutters; UE alt, LE marching 10x each Half kneel Lunge position - UE reaching to the sky both ways - breathing into the ribcage  Manual: Fascial release around the incision  Treatment: 11/24/21 Exercises  Woodpecker - 10x each side Side stepping blue loop 3 x across mirror Supine on foam roll - knee fallout, march, UE alt - 20x each  Manual: Fascial release around the incision  Treatment: 11/19/21 Exercises  Woodpecker - 10x each side Side stepping blue loop 3 x across mirror Sidelying hip abduction toe fwd and toe down Standing blue loop glute min and glutes Leaning on chair hip ext      PATIENT EDUCATION:  Education details: Access Code: [email or web address]  Person educated: Patient Education method: Explanation, Demonstration, Corporate treasurer cues, Verbal cues, and Handouts Education comprehension: verbalized understanding     HOME EXERCISE PROGRAM:   Access Code: [email or web address] URL:  [email or web address] Date: 12/15/2021 Prepared by: Jari Favre  Exercises - Small Range Straight Leg Raise  - 1 x daily - 7 x weekly - 3 sets - 10 reps - Thoracic rotation bow and arrow stretch  - 1 x daily - 7 x weekly -  3 sets - 10 reps - Quadruped Alternating Leg Extensions  - 1 x daily - 7 x weekly - 3 sets - 10 reps - Sidelying Hip Abduction  - 1 x daily - 7 x weekly - 3 sets - 10 reps - Primal Push Up with Foam Roll  - 1 x daily - 7 x weekly - 3 sets - 10 reps - Modified Single-Leg Deadlift  - 1 x daily - 7 x weekly - 3 sets - 10 reps - Side Stepping with Resistance at Ankles  - 1 x daily - 7 x weekly - 3 sets - 10 reps - Hip Abduction with Resistance Loop  - 1 x daily - 7 x weekly - 3 sets - 10 reps - Hip Extension with Resistance Loop  - 1 x daily - 7 x weekly - 3 sets - 10 reps - Bent Knee Fallouts  - 1 x daily - 7 x weekly - 3 sets - 10 reps - Plank on Knees  - 1 x daily - 7 x weekly - 3 sets - 10 reps - Side Plank on Knees  - 1 x daily - 7 x weekly - 3 sets - 10 reps - Supine 90/90 Shoulder Flexion with Abdominal Bracing  - 1 x daily - 7 x weekly - 3 sets - 10 reps   ASSESSMENT:   CLINICAL IMPRESSION: Pt arrives pain free. She is ind with her exercises and understands how to progress. She was able to perform planks on knees and verbally reviewed things she will get back to once her shoulder is feeling better, ie how to progress with yoga flows and how to modify as needed. Pt will d/c from PT today.   OBJECTIVE IMPAIRMENTS decreased activity tolerance, decreased coordination, decreased endurance, decreased strength, increased fascial restrictions, and pain.    ACTIVITY LIMITATIONS community activity and exercise .    PERSONAL FACTORS 1-2 comorbidities: difficult delivery; hernia repair  are also affecting patient's functional outcome.      REHAB POTENTIAL: Excellent   CLINICAL DECISION MAKING: Stable/uncomplicated   EVALUATION COMPLEXITY: Low      GOALS: Goals reviewed with patient? Yes   SHORT TERM GOALS: Target date: 10/29/2021   Ind with initial HEP Baseline:  Goal status: met   2.  Pt will report 25% less pain around the incision due to improved soft tissue mobility Baseline:  Goal status: met   LONG TERM GOALS: Target date: 12/24/2021   Pt will be independent with advanced HEP to maintain improvements made throughout therapy  Baseline:  Goal status: met   2.  Pt will report 80% reduction of pain due to improvements in posture, strength, and muscle length   Baseline: 90% 11/24/21  Goal status: met   3.  Pt will be able to confidently and safely return to yoga due to improved core strength Baseline: has not done since my shoulder is hurting Goal status: deferred due to shoulder  4.  Pt will have 5/5 MMT of bil hip strength for improved stability of trunk in single leg activities Baseline: hip strength 5/5 bil (12/15/21)  Goal status: Met     PLAN: PT FREQUENCY: 1x/week   PT DURATION: 12 weeks   PLANNED INTERVENTIONS: Therapeutic exercises, Therapeutic activity, Neuromuscular re-education, Balance training, Gait training, Patient/Family education, Joint mobilization, Dry Needling, Electrical stimulation, Cryotherapy, Moist heat, scar mobilization, Taping, Biofeedback, and Manual therapy   PLAN FOR NEXT SESSION: d/c   Jule Ser, PT 12/15/2021, 1:58 PM  PHYSICAL THERAPY DISCHARGE SUMMARY  Visits from Start of Care: 7  Current functional level related to goals / functional outcomes:   See above goals Remaining deficits: See above   Education / Equipment: HEP   Patient agrees to discharge. Patient goals were met. Patient is being discharged due to meeting the stated rehab goals.  Gustavus Bryant, PT 12/15/21 2:06 PM

## 2021-12-15 ENCOUNTER — Ambulatory Visit (INDEPENDENT_AMBULATORY_CARE_PROVIDER_SITE_OTHER): Payer: Managed Care, Other (non HMO) | Admitting: Internal Medicine

## 2021-12-15 ENCOUNTER — Encounter: Payer: Self-pay | Admitting: Internal Medicine

## 2021-12-15 ENCOUNTER — Ambulatory Visit: Payer: Managed Care, Other (non HMO) | Admitting: Physical Therapy

## 2021-12-15 DIAGNOSIS — R293 Abnormal posture: Secondary | ICD-10-CM

## 2021-12-15 DIAGNOSIS — I480 Paroxysmal atrial fibrillation: Secondary | ICD-10-CM | POA: Diagnosis not present

## 2021-12-15 DIAGNOSIS — J4520 Mild intermittent asthma, uncomplicated: Secondary | ICD-10-CM

## 2021-12-15 DIAGNOSIS — G4733 Obstructive sleep apnea (adult) (pediatric): Secondary | ICD-10-CM | POA: Diagnosis not present

## 2021-12-15 DIAGNOSIS — M6281 Muscle weakness (generalized): Secondary | ICD-10-CM | POA: Diagnosis not present

## 2021-12-15 DIAGNOSIS — Z9989 Dependence on other enabling machines and devices: Secondary | ICD-10-CM | POA: Diagnosis not present

## 2021-12-15 NOTE — Assessment & Plan Note (Signed)
Using only Singulair.  No recent exacerbation and feels of comfortably controlled.  Uncomplicated.

## 2021-12-15 NOTE — Assessment & Plan Note (Signed)
Benefits from CPAP with good compliance and control.  Discussed association between A-fib and sleep apnea. Plan-continue auto 5-15

## 2021-12-15 NOTE — Assessment & Plan Note (Signed)
Understands association between A-fib and OSA.  She is compliant with CPAP.  Follows with cardiology

## 2021-12-15 NOTE — Patient Instructions (Signed)
We can continue CPAP auto 5-15 ° °Please call if we can help °

## 2021-12-30 ENCOUNTER — Ambulatory Visit (INDEPENDENT_AMBULATORY_CARE_PROVIDER_SITE_OTHER)
Admission: RE | Admit: 2021-12-30 | Discharge: 2021-12-30 | Disposition: A | Discharge status: Home / Self Care | Payer: Self-pay | Source: Ambulatory Visit | Attending: Cardiology | Admitting: Cardiology

## 2021-12-30 DIAGNOSIS — Z8249 Family history of ischemic heart disease and other diseases of the circulatory system: Secondary | ICD-10-CM

## 2021-12-30 IMAGING — CT CT CARDIAC CORONARY ARTERY CALCIUM SCORE
3 series · 14 of 20 positions shown, 16 images · non-contrast
Comparison: None Available.
COMPARISON: None Available.

Addendum:
EXAM:
OVER-READ INTERPRETATION  CT CHEST

The following report is a limited chest CT over-read performed by
[DATE]. This over-read does not include interpretation of cardiac
or coronary anatomy or pathology. The coronary calcium score
interpretation by the cardiologist is attached.
CLINICAL DATA: Cardiovascular disease risk stratification
CAD screening, low CAD risk
CT Coronary Calcium Score
TECHNIQUE: A gated, non-contrast computed tomography scan of the heart was
performed using 3mm slice thickness. Axial images were analyzed on a
dedicated workstation. Calcium scoring of the coronary arteries was
performed using the Agatston method.

[Series 2: cascseq 2.0 sa36 (id) (id) · axial · 0.37mm/px · z∈[+1414,+1504]mm · 4 of 76 slices shown]
[im 16/76  vessel]
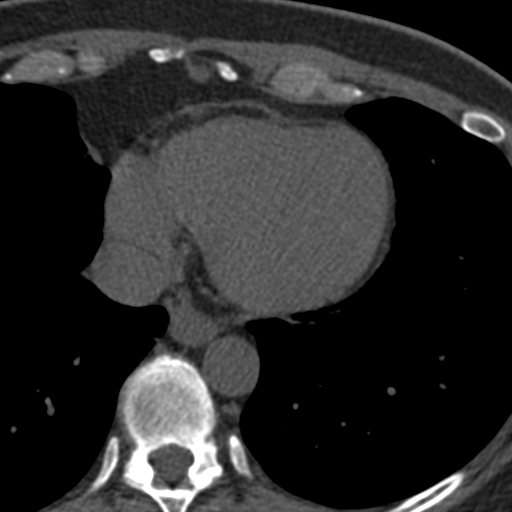
[im 31/76  vessel]
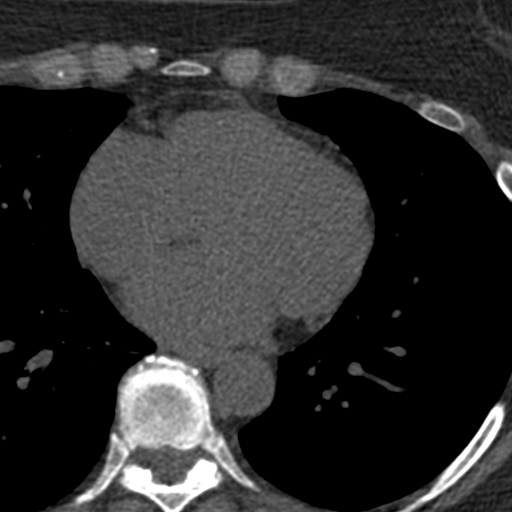
[im 46/76  vessel]
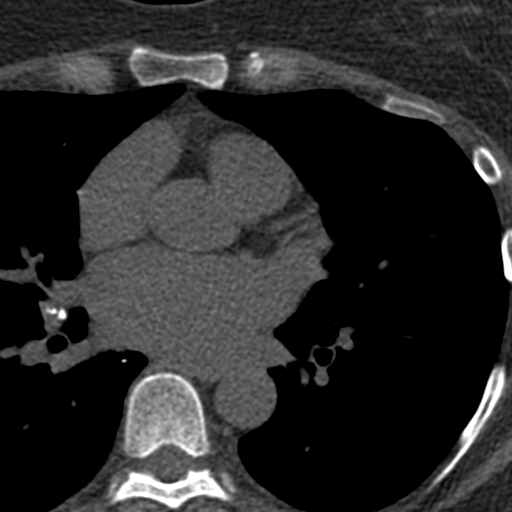
[im 61/76  vessel]
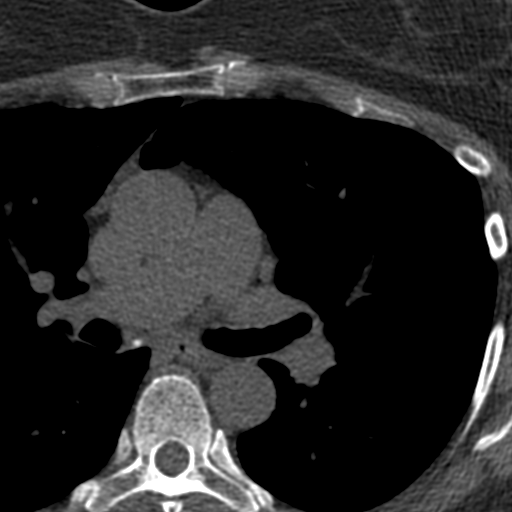

[Series 3: cascseq 2.0 bf37 st · axial · 0.62mm/px · z∈[+1408,+1508]mm · 5 of 76 slices shown, 7 images]
[im 13/76  vessel]
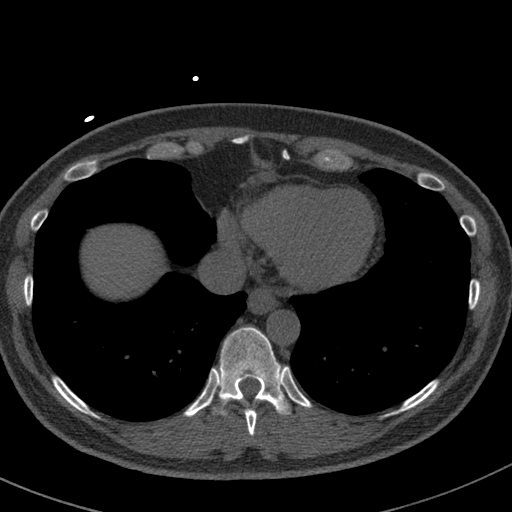
[im 13/76  lung]
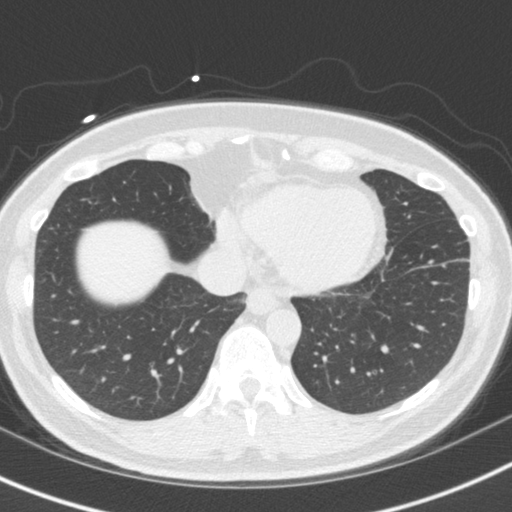
[im 26/76  vessel]
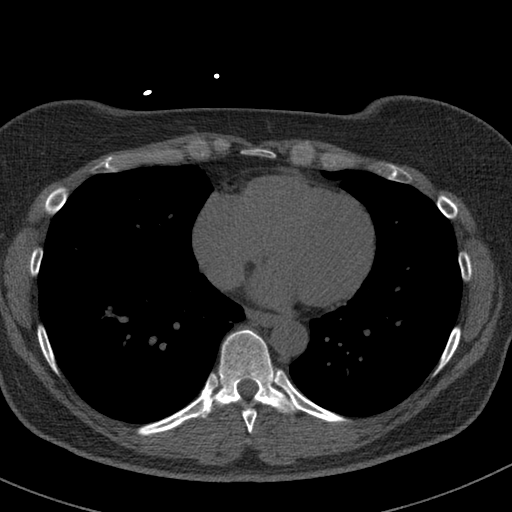
[im 38/76  vessel]
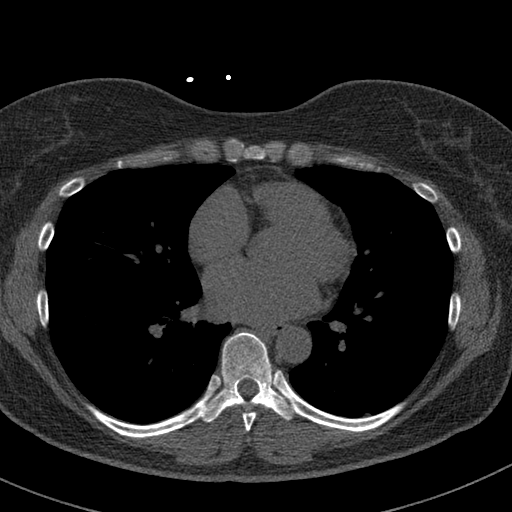
[im 51/76  vessel]
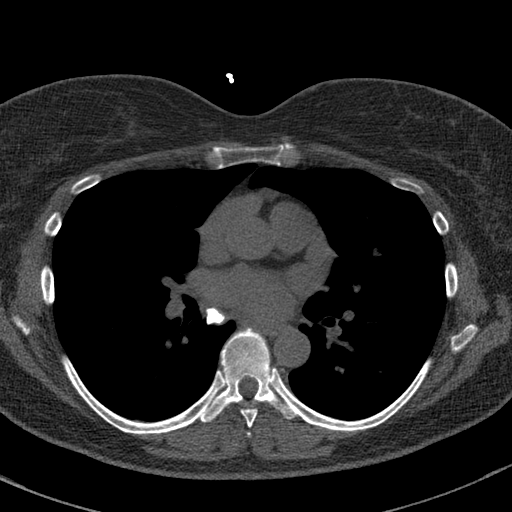
[im 63/76  vessel]
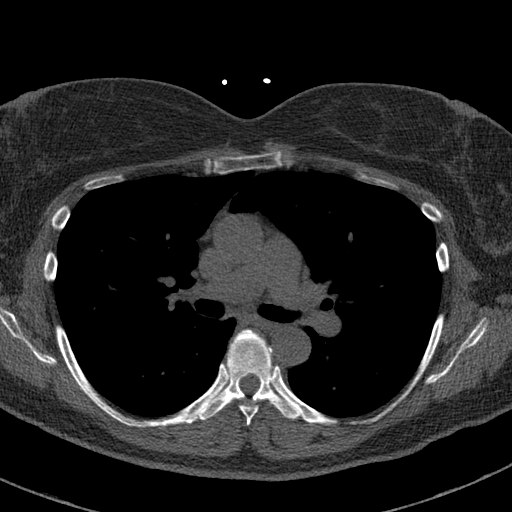
[im 63/76  lung]
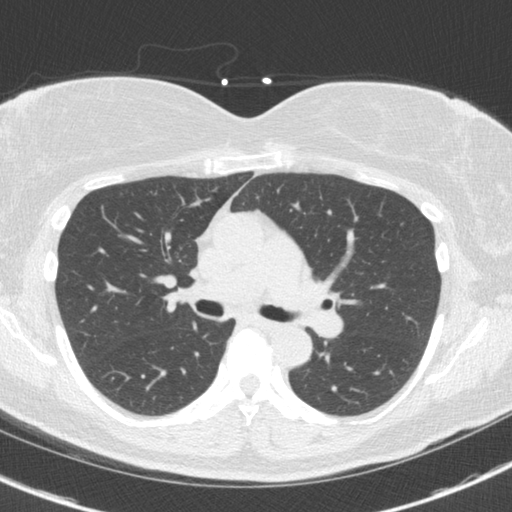

[Series 4: cascseq 2.0 br59 lung · axial · 0.62mm/px · z∈[+1408,+1508]mm · 5 of 76 slices shown]
[im 13/76  lung]
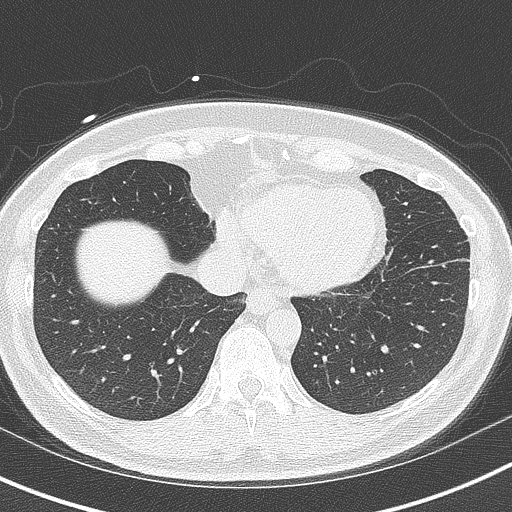
[im 26/76  lung]
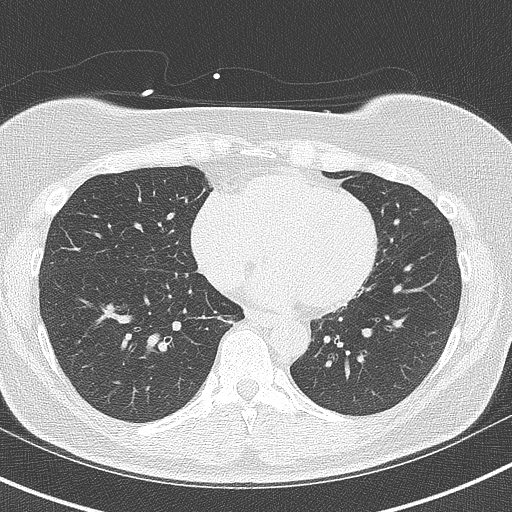
[im 38/76  lung]
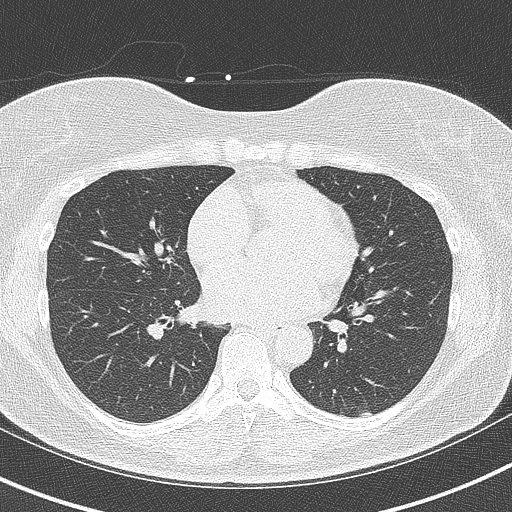
[im 51/76  lung]
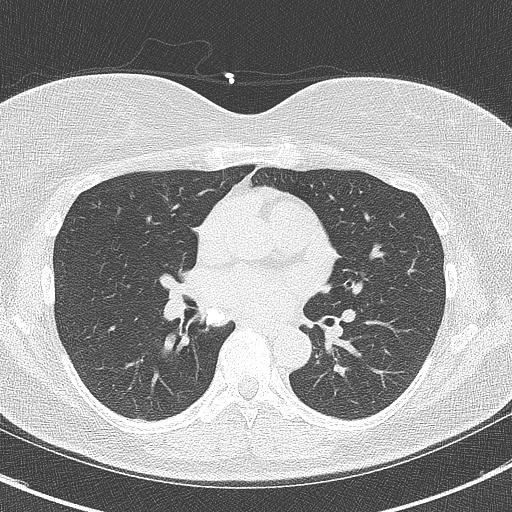
[im 63/76  lung]
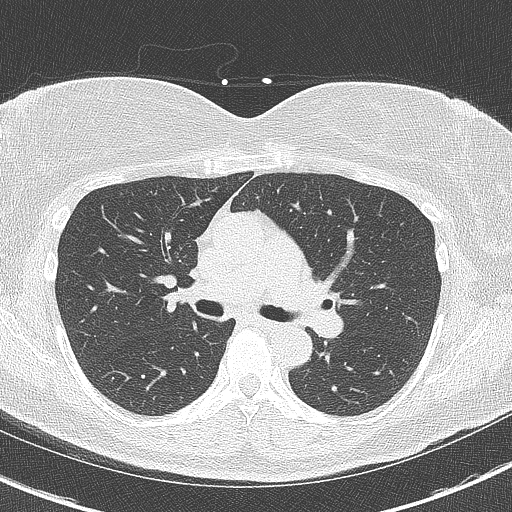

[14 of 20 positions shown; findings below may reference images not displayed]

FINDINGS: Vascular: No acute noncardiac vascular finding.

Mediastinum/Nodes: Calcified right hilar/mediastinal lymph nodes. No
pathologically enlarged mediastinal or hilar lymph nodes in the
acquired field-of-view visualized within limitation of noncontrast
examination. Distal esophagus is grossly unremarkable. Tiny hiatal
hernia.

Lungs/Pleura: Within the visualized portions of the thorax there are
no suspicious pulmonary nodules or masses, no focal airspace
consolidation and no pleural effusion or pneumothorax.

Upper Abdomen: No acute abnormality.

Musculoskeletal: No acute osseous abnormality.
IMPRESSION: No significant noncardiac finding.
FINDINGS: Coronary Calcium Score:

Left main: 0

Left anterior descending artery: 0

Left circumflex artery: 0

Right coronary artery: 0

Total: 0

Pericardium: Normal.

Ascending Aorta: Normal caliber. Ascending aorta measures
approximately 31mm at the mid ascending aorta measured in an axial
plane.

Non-cardiac: See separate report from [REDACTED].
IMPRESSION: Coronary calcium score of 0.



If CAC=0, it is reasonable to withhold statin therapy and reassess
in 5 to 10 years, as long as higher risk conditions are absent
(diabetes mellitus, family history of premature CHD in first degree
relatives (males <55 years; females <65 years), cigarette smoking,
or LDL >=190 mg/dL).

If CAC is 1 to 99, it is reasonable to initiate statin therapy for
patients >=55 years of age.

If CAC is >=100 or >=75th percentile, it is reasonable to initiate
statin therapy at any age.

Cardiology referral should be considered for patients with CAC
scores >=400 or >=75th percentile.

*[3Z] AHA/ACC/AACVPR/AAPA/ABC/DALMA/DALMA/DALMA/DALMA/DALMA/DALMA/DALMA
Guideline on the Management of Blood Cholesterol: A Report of the
American College of Cardiology/American Heart Association Task Force
on Clinical Practice Guidelines. J Am Coll Cardiol.
[3Z];73(24):[PHONE_NUMBER].

*** End of Addendum ***
EXAM:
OVER-READ INTERPRETATION  CT CHEST

The following report is a limited chest CT over-read performed by
[DATE]. This over-read does not include interpretation of cardiac
or coronary anatomy or pathology. The coronary calcium score
interpretation by the cardiologist is attached.
FINDINGS: Vascular: No acute noncardiac vascular finding.

Mediastinum/Nodes: Calcified right hilar/mediastinal lymph nodes. No
pathologically enlarged mediastinal or hilar lymph nodes in the
acquired field-of-view visualized within limitation of noncontrast
examination. Distal esophagus is grossly unremarkable. Tiny hiatal
hernia.

Lungs/Pleura: Within the visualized portions of the thorax there are
no suspicious pulmonary nodules or masses, no focal airspace
consolidation and no pleural effusion or pneumothorax.

Upper Abdomen: No acute abnormality.

Musculoskeletal: No acute osseous abnormality.
IMPRESSION: No significant noncardiac finding.

## 2022-01-21 ENCOUNTER — Encounter: Payer: Self-pay | Admitting: Internal Medicine

## 2022-01-21 ENCOUNTER — Ambulatory Visit: Payer: Managed Care, Other (non HMO) | Admitting: Internal Medicine

## 2022-01-21 VITALS — BP 134/80 | HR 63 | Ht 66.0 in | Wt 178.8 lb

## 2022-01-21 DIAGNOSIS — R002 Palpitations: Secondary | ICD-10-CM | POA: Diagnosis not present

## 2022-01-21 DIAGNOSIS — I480 Paroxysmal atrial fibrillation: Secondary | ICD-10-CM

## 2022-01-21 MED ORDER — METOPROLOL SUCCINATE ER 25 MG PO TB24: 25.0000 mg | ORAL_TABLET | Freq: Every day | ORAL | 3 refills | Status: DC

## 2022-01-21 NOTE — Patient Instructions (Addendum)
Medication Instructions:  Your physician has recommended you make the following change in your medication:   MEDICATION INCREASE:  Start taking Metoprolol Succinate 25 mg-  Take ONE tablet by mouth DAILY.       Lab Work: None ordered.  If you have labs (blood work) drawn today and your tests are completely normal, you will receive your results only by: MyChart Message (if you have MyChart) OR A paper copy in the mail If you have any lab test that is abnormal or we need to change your treatment, we will call you to review the results.  Testing/Procedures: None ordered.  Follow-Up: At [facility], you and your health needs are our priority.  As part of our continuing mission to provide you with exceptional heart care, we have created designated Provider Care Teams.  These Care Teams include your primary Cardiologist (physician) and Advanced Practice Providers (APPs -  Physician Assistants and Nurse Practitioners) who all work together to provide you with the care you need, when you need it.  We recommend signing up for the patient portal called "MyChart".  Sign up information is provided on this After Visit Summary.  MyChart is used to connect with patients for Virtual Visits (Telemedicine).  Patients are able to view lab/test results, encounter notes, upcoming appointments, etc.  Non-urgent messages can be sent to your provider as well.   To learn more about what you can do with MyChart, go to [email or web address].    Your next appointment:   6 Months   The format for your next appointment:   In Person  Provider:   Lewayne Bunting, MD{or one of the following Advanced Practice Providers on your designated Care Team:   Francis Dowse, [place] Casimiro Needle "Mardelle Matte" Lanna Poche, [place]   Important Information About Sugar

## 2022-01-21 NOTE — Progress Notes (Signed)
HPI Ms. Zacher returns today for followup. I saw her 2 months ago when she presented to the ED with palpitations and found to have PVC's, and PAC's and an isolated episode of atrial fib. She was started on eliquis. She has had rare palpitations since then. She has cut her caffeine and ETOH intake way down. She denies chest pain or sob. No syncope.  Allergies  Allergen Reactions   Latex      Current Outpatient Medications  Medication Sig Dispense Refill   metoprolol succinate (TOPROL XL) 25 MG 24 hr tablet Take 0.5 tablets (12.5 mg total) by mouth daily. Take with or immediately following a meal. 45 tablet 1   metoprolol tartrate (LOPRESSOR) 25 MG tablet Take 1 tablet (25 mg total) by mouth as needed (atrial fibrillation, palpitations). 30 tablet 1   montelukast (SINGULAIR) 10 MG tablet TAKE ONE TABLET BY MOUTH AT BEDTIME 30 tablet 3   apixaban (ELIQUIS) 5 MG TABS tablet Take 1 tablet (5 mg total) by mouth 2 (two) times daily for 21 days. (Patient not taking: Reported on 01/21/2022) 42 tablet 0   No current facility-administered medications for this visit.     Past Medical History:  Diagnosis Date   DJD (degenerative joint disease), lumbar 11/22/2019   Xray 11/2019   Exercise-induced asthma 09/21/2018   Allergy induced also   Hernia, umbilical    Osteoarthritis, hip, bilateral 11/22/2019   xrays 11/2019   Vitamin B12 deficiency 11/20/2019    ROS:   All systems reviewed and negative except as noted in the HPI.   Past Surgical History:  Procedure Laterality Date   DENTAL SURGERY     HERNIA REPAIR     IVF     egg collection     Family History  Problem Relation Age of Onset   Breast cancer Mother        post-menopausal, DCIS   Endometrial cancer Mother        mid-late 65's   Hypertension Mother    Hypertension Father    Diabetes Father    Heart disease Father        s/p 3V CABG age 16   Mental illness Father        depression   Arthritis Father         bilateral knee replacements   Cancer Father 63       unknown primary, metastatic   Breast cancer Cousin    Mental illness Brother        depression   Healthy Son    Colon cancer Neg Hx      Social History   Socioeconomic History   Marital status: Single    Spouse name: Martina Sinner   Number of children: 1   Years of education: MFA in Directing   Highest education level: Not on file  Occupational History   Occupation: Adjunct professor     Comment: Sports coach: [employer]  Tobacco Use   Smoking status: Never   Smokeless tobacco: Never  Vaping Use   Vaping Use: Never used  Substance and Sexual Activity   Alcohol use: Not Currently    Alcohol/week: 0.0 - 3.0 standard drinks of alcohol   Drug use: No   Sexual activity: Yes    Partners: Male    Birth control/protection: Surgical    Comment: Partner has vasectomy   Other Topics Concern   Not on file  Social History Narrative  Estranged from husband since 2011.   Lives with her son.   Her son was conceived by IVF after she and her husband split.   Breast fed x 4 years.   Social Determinants of Health   Financial Resource Strain: Not on file  Food Insecurity: Not on file  Transportation Needs: Not on file  Physical Activity: Not on file  Stress: Not on file  Social Connections: Not on file  Intimate Partner Violence: Not on file     BP 134/80   Pulse 63   Ht 5\' 6"  (1.676 m)   Wt 178 lb 12.8 oz (81.1 kg)   SpO2 97%   BMI 28.86 kg/m   Physical Exam:  Well appearing NAD HEENT: Unremarkable Neck:  No JVD, no thyromegally Lymphatics:  No adenopathy Back:  No CVA tenderness Lungs:  Clear with no wheezes HEART:  Regular rate rhythm, no murmurs, no rubs, no clicks Abd:  soft, positive bowel sounds, no organomegally, no rebound, no guarding Ext:  2 plus pulses, no edema, no cyanosis, no clubbing Skin:  No rashes no nodules Neuro:  CN II through XII intact, motor grossly  intact  EKG - nsr  Assess/Plan:  PAF - she has not had more symptoms. She will undergo watchful waiting and if her symptoms worsen, we would consider adding flecainide. Coags - she has not had any bleeding on eliquis. Continue.  Daulton Harbaugh,MD

## 2022-01-22 ENCOUNTER — Encounter (HOSPITAL_BASED_OUTPATIENT_CLINIC_OR_DEPARTMENT_OTHER): Payer: Self-pay

## 2022-02-15 ENCOUNTER — Other Ambulatory Visit: Payer: Self-pay | Admitting: Family Medicine

## 2022-02-23 ENCOUNTER — Ambulatory Visit: Payer: Managed Care, Other (non HMO) | Admitting: Family Medicine

## 2022-02-23 ENCOUNTER — Encounter: Payer: Self-pay | Admitting: Family Medicine

## 2022-02-23 VITALS — BP 144/74 | HR 62 | Temp 98.5°F | Ht 66.0 in | Wt 180.0 lb

## 2022-02-23 DIAGNOSIS — Z0000 Encounter for general adult medical examination without abnormal findings: Secondary | ICD-10-CM | POA: Diagnosis not present

## 2022-02-23 DIAGNOSIS — J4520 Mild intermittent asthma, uncomplicated: Secondary | ICD-10-CM

## 2022-02-23 DIAGNOSIS — E538 Deficiency of other specified B group vitamins: Secondary | ICD-10-CM | POA: Diagnosis not present

## 2022-02-23 DIAGNOSIS — G4733 Obstructive sleep apnea (adult) (pediatric): Secondary | ICD-10-CM | POA: Diagnosis not present

## 2022-02-23 DIAGNOSIS — M4722 Other spondylosis with radiculopathy, cervical region: Secondary | ICD-10-CM

## 2022-02-23 DIAGNOSIS — M19011 Primary osteoarthritis, right shoulder: Secondary | ICD-10-CM

## 2022-02-23 DIAGNOSIS — I480 Paroxysmal atrial fibrillation: Secondary | ICD-10-CM | POA: Diagnosis not present

## 2022-02-23 DIAGNOSIS — M47816 Spondylosis without myelopathy or radiculopathy, lumbar region: Secondary | ICD-10-CM

## 2022-02-23 DIAGNOSIS — Z23 Encounter for immunization: Secondary | ICD-10-CM

## 2022-02-23 DIAGNOSIS — K760 Fatty (change of) liver, not elsewhere classified: Secondary | ICD-10-CM

## 2022-02-23 DIAGNOSIS — Z9989 Dependence on other enabling machines and devices: Secondary | ICD-10-CM

## 2022-02-23 LAB — LIPID PANEL
Cholesterol: 156 mg/dL (ref 0–200)
HDL: 60.2 mg/dL (ref >39.00)
LDL Cholesterol: 84 mg/dL (ref 0–99)
NonHDL: 95.54
Total CHOL/HDL Ratio: 3
Triglycerides: 57 mg/dL (ref 0.0–149.0)
VLDL: 11.4 mg/dL (ref 0.0–40.0)

## 2022-02-23 LAB — VITAMIN B12: Vitamin B-12: 476 pg/mL (ref 211–911)

## 2022-02-23 NOTE — Progress Notes (Signed)
Subjective  Chief Complaint  Patient presents with   Annual Exam    Pt here for Annual exam and is     HPI: Ariana Walsh is a 51 y.o. female who presents to [facility] [place] today for a Female Wellness Visit. She also has the concerns and/or needs as listed above in the chief complaint. These will be addressed in addition to the Health Maintenance Visit.   Wellness Visit: annual visit with health maintenance review and exam without Pap  HM: screens are current. Mammo due later this year. Defers shingrix- will return when convenient. Tdap today. Has improved diet.   Chronic disease f/u and/or acute problem visit: (deemed necessary to be done in addition to the wellness visit): PAF/SVT/palpiations: reviewed cardiology notes, hospital notes/labs, and EP notes. She has been stable on bb. No further palpitations. Had cardiac calcium score of 0. No cp On cpap for osa. Tolerating well OA of neck and shoulder: both have improved.  Eating low fat. Has fatty liver B12 due for recheck   Assessment  1. Annual physical exam   2. Need for Tdap vaccination   3. OSA on CPAP   4. Paroxysmal atrial fibrillation with rapid ventricular response (HCC)   5. Mild intermittent asthma without complication   6. Spondylosis of lumbar region without myelopathy or radiculopathy   7. Osteoarthritis of spine with radiculopathy, cervical region   8. Osteoarthritis of right acromioclavicular joint   9. Fatty infiltration of liver   10. Vitamin B12 deficiency      Plan  Female Wellness Visit: Age appropriate Health Maintenance and Prevention measures were discussed with patient. Included topics are cancer screening recommendations, ways to keep healthy (see AVS) including dietary and exercise recommendations, regular eye and dental care, use of seat belts, and avoidance of moderate alcohol use and tobacco use.  BMI: discussed patient's BMI and encouraged positive lifestyle  modifications to help get to or maintain a target BMI. HM needs and immunizations were addressed and ordered. See below for orders. See HM and immunization section for updates. Routine labs and screening tests ordered including cmp, cbc and lipids where appropriate. Discussed recommendations regarding Vit D and calcium supplementation (see AVS)  Chronic disease management visit and/or acute problem visit: PAF/SVT: monitoring. On bb. Has stabilized. Thought to be related to covid infection last year.  Continue cpap for osa Asthma and allergies are controlled.  Reviewed labs from may. Add lipids an b12 today.  Tdap today. Return for shingrix.  Follow up: 12 mo for cpe  Orders Placed This Encounter  Procedures   Tdap vaccine greater than or equal to 7yo IM   Vitamin B12   Lipid panel   No orders of the defined types were placed in this encounter.     Body mass index is 29.05 kg/m. Wt Readings from Last 3 Encounters:  02/23/22 180 lb (81.6 kg)  01/21/22 178 lb 12.8 oz (81.1 kg)  12/15/21 176 lb 6.4 oz (80 kg)     Patient Active Problem List   Diagnosis Date Noted   Paroxysmal atrial fibrillation with rapid ventricular response (HCC) 11/29/2021    Priority: High   Family history of coronary artery disease 08/13/2021    Priority: High    Father and many many family members    OSA on CPAP 10/04/2019    Priority: High    HST 11/14/19- AHI 17.3/ hr, desaturation to 80%, body weight 179 lbs    Fatty infiltration  of liver 11/11/2021    Priority: Medium    Osteoarthritis of spine with radiculopathy, cervical region 08/28/2021    Priority: Medium    Osteoarthritis of right acromioclavicular joint 08/28/2021    Priority: Medium    Rotator cuff arthropathy of right shoulder 08/28/2021    Priority: Medium    Neuroforaminal stenosis of cervical spine 08/28/2021    Priority: Medium    Mild intermittent asthma 06/02/2020    Priority: Medium    DJD (degenerative joint disease),  lumbar 11/22/2019    Priority: Medium     Xray 11/2019    Osteoarthritis, hip, bilateral 11/22/2019    Priority: Medium     xrays 11/2019    Exercise-induced asthma 09/21/2018    Priority: Medium     Allergy induced also    Umbilical hernia 12/30/2016    Priority: Medium     Dr. Rayburn Ma. Patient is waiting for a better time to have the surgery.    Vitamin B12 deficiency 11/20/2019    Priority: Low   Health Maintenance  Topic Date Due   Zoster Vaccines- Shingrix (1 of 2) Never done   INFLUENZA VACCINE  02/09/2022   MAMMOGRAM  04/14/2022   Fecal DNA (Cologuard)  11/01/2023   PAP SMEAR-Modifier  11/15/2024   TETANUS/TDAP  02/24/2032   COVID-19 Vaccine  Completed   Hepatitis C Screening  Completed   HIV Screening  Completed   HPV VACCINES  Aged Out   Immunization History  Administered Date(s) Administered   Influenza-Unspecified 03/26/2020, 03/25/2021   Moderna Sars-Covid-2 Vaccination 12/11/2020   PFIZER(Purple Top)SARS-COV-2 Vaccination 09/08/2019, 09/29/2019, 04/05/2020   Pfizer Covid-19 Vaccine Bivalent Booster 36yrs & up 08/05/2021   Tdap 02/23/2022   We updated and reviewed the patient's past history in detail and it is documented below. Allergies: Patient is allergic to latex. Past Medical History Patient  has a past medical history of DJD (degenerative joint disease), lumbar (11/22/2019), Exercise-induced asthma (09/21/2018), Hernia, umbilical, Osteoarthritis, hip, bilateral (11/22/2019), and Vitamin B12 deficiency (11/20/2019). Past Surgical History Patient  has a past surgical history that includes Dental surgery; IVF; and Hernia repair. Family History: Patient family history includes Arthritis in her father; Breast cancer in her cousin and mother; Cancer (age of onset: 16) in her father; Diabetes in her father; Endometrial cancer in her mother; Healthy in her son; Heart disease in her father; Hypertension in her father and mother; Mental illness in her brother and  father. Social History:  Patient  reports that she has never smoked. She has never used smokeless tobacco. She reports that she does not currently use alcohol. She reports that she does not use drugs.  Review of Systems: Constitutional: negative for fever or malaise Ophthalmic: negative for photophobia, double vision or loss of vision Cardiovascular: negative for chest pain, dyspnea on exertion, or new LE swelling Respiratory: negative for SOB or persistent cough Gastrointestinal: negative for abdominal pain, change in bowel habits or melena Genitourinary: negative for dysuria or gross hematuria, no abnormal uterine bleeding or disharge Musculoskeletal: negative for new gait disturbance or muscular weakness Integumentary: negative for new or persistent rashes, no breast lumps Neurological: negative for TIA or stroke symptoms Psychiatric: negative for SI or delusions Allergic/Immunologic: negative for hives  Patient Care Team    Relationship Specialty Notifications Start End  [name], MD PCP - General Family Medicine  02/17/18   [name], MD PCP - Cardiology Cardiology  05/27/21   [name], MD Consulting Physician General Surgery  12/30/16  [name], MD Consulting Physician General Surgery  03/23/21     Objective  Vitals: BP (!) 144/74   Pulse 62   Temp 98.5 F (36.9 C)   Ht 5\' 6"  (1.676 m)   Wt 180 lb (81.6 kg)   SpO2 97%   BMI 29.05 kg/m  General:  Well developed, well nourished, no acute distress  Psych:  Alert and orientedx3,normal mood and affect HEENT:  Normocephalic, atraumatic, non-icteric sclera,  supple neck without adenopathy, mass or thyromegaly Cardiovascular:  Normal S1, S2, RRR without gallop, rub or murmur Respiratory:  Good breath sounds bilaterally, CTAB with normal respiratory effort Gastrointestinal: normal bowel sounds, soft, non-tender, no noted masses. No HSM MSK: no deformities, contusions. Joints are without erythema or  swelling.  Skin:  Warm, no rashes or suspicious lesions noted Neurologic:    Mental status is normal. CN 2-11 are normal. Gross motor and sensory exams are normal. Normal gait. No tremor   Commons side effects, risks, benefits, and alternatives for medications and treatment plan prescribed today were discussed, and the patient expressed understanding of the given instructions. Patient is instructed to call or message via MyChart if he/she has any questions or concerns regarding our treatment plan. No barriers to understanding were identified. We discussed Red Flag symptoms and signs in detail. Patient expressed understanding regarding what to do in case of urgent or emergency type symptoms.  Medication list was reconciled, printed and provided to the patient in AVS. Patient instructions and summary information was reviewed with the patient as documented in the AVS. This note was prepared with assistance of Dragon voice recognition software. Occasional wrong-word or sound-a-like substitutions may have occurred due to the inherent limitations of voice recognition software  This visit occurred during the SARS-CoV-2 public health emergency.  Safety protocols were in place, including screening questions prior to the visit, additional usage of staff PPE, and extensive cleaning of exam room while observing appropriate contact time as indicated for disinfecting solutions.

## 2022-02-23 NOTE — Patient Instructions (Signed)

## 2022-02-27 ENCOUNTER — Other Ambulatory Visit: Payer: Self-pay | Admitting: Cardiology

## 2022-02-27 ENCOUNTER — Telehealth: Payer: Self-pay | Admitting: Student in an Organized Health Care Education/Training Program

## 2022-02-27 ENCOUNTER — Telehealth: Payer: Self-pay | Admitting: Cardiology

## 2022-02-27 MED ORDER — APIXABAN 5 MG PO TABS: 5.0000 mg | ORAL_TABLET | Freq: Two times a day (BID) | ORAL | 1 refills | Status: DC

## 2022-02-27 NOTE — Telephone Encounter (Signed)
Patient called to report a visual disturbance that she was concerned may be a side effect of her metoprolol.  She states that she was sitting watching television when she suddenly developed a visual disturbance that she described as " kaleidoscope being of her peripheral vision with C shaped objects."  The visual disturbances were present with her eyes open and close and were in both eyes.  She denies having associated chest pain, shortness of breath, palpitations, headache, weakness, or numbness.  She currently takes metoprolol and Eliquis for A-fib.  Her vision had completely returned to normal by the time that I called her she said that her symptoms lasted for about 5 minutes.  I told her that her visual disturbances are unlikely to be related to her medications are atrial fibrillation.  Given the peculiar description of her visual disturbances I told her that I cannot rule out the possibility of a TIA or embolic phenomenon.  I recommended that she go to the ED to be evaluated, but given that the patient felt back to normal, she declined.  I told her that if her visual disturbances return that she should probably go to the ED.  She verbalized understanding.

## 2022-02-27 NOTE — Telephone Encounter (Signed)
Contacted by the patient who reports that she has been in AF since yesterday evening. She took two doses of metoprolol 25mg  PRN with no improvement along with her daily Toprol XL. She also notes that she has been only taking Toprol XL 12.5mg  due to previous bradycardia. I have instructed her to start taking the Toprol XL 25mg  daily. She may take another dose of metoprolol tartrate 25mg  if needed today. She tells me that her BP and HR have been stable and WNL She reports that she has been under tremendous stress lately which may be precipitating her symptoms. She was previously anticoagulated with Eliquis for 21 days only given a CHA2DS2VASc of 1.  During our initial call, I sent Eliquis to her pharmacy based on her last OV with Dr. who noted that she was taking anticoagulation. The more that I read back into her chart, it appears that she may not need AC given her low CHA2DS2VASc score and low risk for stroke associated with AF. I called her back to discuss this with her although she has already picked up and taken the Eliquis. She also reports that she has converted to NSR and feels much better. She prefers to stay on the Eliquis until she is seen by Dr. on 03/25/22 at 4pm.   I will route this message to her so that she is aware.    Ladona Ridgel NP-C Structural Heart Team  Pager: [phone] Phone: [phone]

## 2022-03-09 ENCOUNTER — Telehealth (HOSPITAL_BASED_OUTPATIENT_CLINIC_OR_DEPARTMENT_OTHER): Payer: Self-pay | Admitting: Cardiology

## 2022-03-09 ENCOUNTER — Ambulatory Visit (HOSPITAL_COMMUNITY)
Admission: RE | Admit: 2022-03-09 | Discharge: 2022-03-09 | Disposition: A | Discharge status: Home / Self Care | Payer: Managed Care, Other (non HMO) | Source: Ambulatory Visit | Attending: Physician Assistant | Admitting: Physician Assistant

## 2022-03-09 ENCOUNTER — Encounter (HOSPITAL_COMMUNITY): Payer: Self-pay | Admitting: Physician Assistant

## 2022-03-09 VITALS — BP 144/80 | HR 64 | Ht 66.0 in | Wt 181.4 lb

## 2022-03-09 DIAGNOSIS — Z7901 Long term (current) use of anticoagulants: Secondary | ICD-10-CM | POA: Diagnosis not present

## 2022-03-09 DIAGNOSIS — G4733 Obstructive sleep apnea (adult) (pediatric): Secondary | ICD-10-CM | POA: Insufficient documentation

## 2022-03-09 DIAGNOSIS — I480 Paroxysmal atrial fibrillation: Secondary | ICD-10-CM | POA: Diagnosis present

## 2022-03-09 MED ORDER — FLECAINIDE ACETATE 50 MG PO TABS: 50.0000 mg | ORAL_TABLET | Freq: Two times a day (BID) | ORAL | 3 refills | Status: DC

## 2022-03-09 NOTE — Telephone Encounter (Signed)
Transferred call from call center, woke up in A. Fib this morning, HR 140s, ate breakfast, took both her metoprolol (long and short acting). Checked again about 5  minutes ago HR 131, still in A. Fib, verified with [facility] device. No shortness of breath, patient just worried about staying in A. Fib.   Will route to Dr. Ladona Ridgel and RN

## 2022-03-09 NOTE — Progress Notes (Signed)
Primary Care Physician: [name], MD Primary Cardiologist: Dr Cristal Deer Primary Electrophysiologist: Dr Ladona Ridgel Referring Physician: Ulyses Southward triage    Ariana Walsh is a 51 y.o. female with a history of OSA and atrial fibrillation who presents for follow up in the [facility].  The patient was initially diagnosed with atrial fibrillation 11/29/19 after presenting to the ED with palpitations, chest discomfort, shortness of breath.  EKG revealed atrial fibrillation.  CBC, BMP, D-dimer, TSH, CXR unremarkable.  She was started on diltiazem drip.  Echo with normal LVEF, no significant valvular abnormalities.  She was started on Eliquis.  Seen in consult by Dr. Ladona Ridgel and give single dose of flecainide which converted her to NSR. Patient has a CHADS2VASC score of 1.   On follow up today, patient has had several episodes of afib since her initial diagnosis. Most recently she had an episode 02/27/22 which lasted for 16 hours. She admits she had been under considerable stress at that time with her son and mother both being ill with COVID. She had another episode this morning which lasted a couple hours. When in afib, she has symptoms of heart racing, fluttering, and feeling anxious. There were no specific triggers for this episode. She is compliant with her CPAP and denies any significant alcohol use.   Today, she denies symptoms of chest pain, shortness of breath, orthopnea, PND, lower extremity edema, dizziness, presyncope, syncope, bleeding, or neurologic sequela. The patient is tolerating medications without difficulties and is otherwise without complaint today.    Atrial Fibrillation Risk Factors:  she does have symptoms or diagnosis of sleep apnea. she is compliant with CPAP therapy. she does not have a history of rheumatic fever. she does not have a history of alcohol use.   she has a BMI of Body mass index is 29.28 kg/m.Marland Kitchen Filed Weights    03/09/22 1342  Weight: 82.3 kg    Family History  Problem Relation Age of Onset   Breast cancer Mother        post-menopausal, DCIS   Endometrial cancer Mother        mid-late 68's   Hypertension Mother    Hypertension Father    Diabetes Father    Heart disease Father        s/p 3V CABG age 44   Mental illness Father        depression   Arthritis Father        bilateral knee replacements   Cancer Father 48       unknown primary, metastatic   Breast cancer Cousin    Mental illness Brother        depression   Healthy Son    Colon cancer Neg Hx      Atrial Fibrillation Management history:  Previous antiarrhythmic drugs: flecainide Previous cardioversions: none Previous ablations: none CHADS2VASC score: 1 Anticoagulation history: Eliquis   Past Medical History:  Diagnosis Date   DJD (degenerative joint disease), lumbar 11/22/2019   Xray 11/2019   Exercise-induced asthma 09/21/2018   Allergy induced also   Hernia, umbilical    Osteoarthritis, hip, bilateral 11/22/2019   xrays 11/2019   Vitamin B12 deficiency 11/20/2019   Past Surgical History:  Procedure Laterality Date   DENTAL SURGERY     HERNIA REPAIR     IVF     egg collection    Current Outpatient Medications  Medication Sig Dispense Refill   apixaban (ELIQUIS) 5 MG TABS tablet Take 1 tablet (  5 mg total) by mouth 2 (two) times daily. 60 tablet 1   metoprolol succinate (TOPROL XL) 25 MG 24 hr tablet Take 1 tablet (25 mg total) by mouth daily. Take with or immediately following a meal. 90 tablet 3   metoprolol tartrate (LOPRESSOR) 25 MG tablet Take 1 tablet (25 mg total) by mouth as needed (atrial fibrillation, palpitations). 30 tablet 1   montelukast (SINGULAIR) 10 MG tablet TAKE ONE TABLET BY MOUTH AT BEDTIME 30 tablet 3   No current facility-administered medications for this encounter.    Allergies  Allergen Reactions   Latex     Social History   Socioeconomic History   Marital status: Single     Spouse name: Martina Sinner   Number of children: 1   Years of education: MFA in Directing   Highest education level: Not on file  Occupational History   Occupation: Adjunct professor     Comment: Sports coach: [employer]  Tobacco Use   Smoking status: Never   Smokeless tobacco: Never   Tobacco comments:    Never smoke 03/09/22  Vaping Use   Vaping Use: Never used  Substance and Sexual Activity   Alcohol use: Yes    Alcohol/week: 0.0 - 3.0 standard drinks of alcohol   Drug use: No   Sexual activity: Yes    Partners: Male    Birth control/protection: Surgical    Comment: Partner has vasectomy   Other Topics Concern   Not on file  Social History Narrative   Estranged from husband since 2011.   Lives with her son.   Her son was conceived by IVF after she and her husband split.   Breast fed x 4 years.   Social Determinants of Health   Financial Resource Strain: Not on file  Food Insecurity: Not on file  Transportation Needs: Not on file  Physical Activity: Not on file  Stress: Not on file  Social Connections: Not on file  Intimate Partner Violence: Not on file     ROS- All systems are reviewed and negative except as per the HPI above.  Physical Exam: Vitals:   03/09/22 1342  BP: (!) 144/80  Pulse: 64  Weight: 82.3 kg  Height: 5\' 6"  (1.676 m)    GEN- The patient is a well appearing female, alert and oriented x 3 today.   Head- normocephalic, atraumatic Eyes-  Sclera clear, conjunctiva pink Ears- hearing intact Oropharynx- clear Neck- supple  Lungs- Clear to ausculation bilaterally, normal work of breathing Heart- Regular rate and rhythm, no murmurs, rubs or gallops  GI- soft, NT, ND, + BS Extremities- no clubbing, cyanosis, or edema MS- no significant deformity or atrophy Skin- no rash or lesion Psych- euthymic mood, full affect Neuro- strength and sensation are intact  Wt Readings from Last 3 Encounters:  03/09/22 82.3 kg   02/23/22 81.6 kg  01/21/22 81.1 kg    EKG today demonstrates  SR Vent. rate 64 BPM PR interval 122 ms QRS duration 80 ms QT/QTcB 372/383 ms  Echo 11/29/21 demonstrated   1. Mitral annulus diastolic velocities suggest normal diastolic function.  Left ventricular ejection fraction, by estimation, is 60 to 65%. The left  ventricle has normal function. The left ventricle has no regional wall  motion abnormalities. Left ventricular diastolic function could not be evaluated.   2. Right ventricular systolic function is normal. The right ventricular  size is normal. There is normal pulmonary artery systolic pressure. The  estimated right ventricular systolic pressure is 22.2 mmHg.   3. The mitral valve is normal in structure. No evidence of mitral valve  regurgitation. No evidence of mitral stenosis.   4. The aortic valve is tricuspid. Aortic valve regurgitation is not  visualized. No aortic stenosis is present.   5. The inferior vena cava is normal in size with greater than 50%  respiratory variability, suggesting right atrial pressure of 3 mmHg.   Epic records are reviewed at length today  CHA2DS2-VASc Score = 1  The patient's score is based upon: CHF History: 0 HTN History: 0 Diabetes History: 0 Stroke History: 0 Vascular Disease History: 0 Age Score: 0 Gender Score: 1       ASSESSMENT AND PLAN: 1. Paroxysmal Atrial Fibrillation (ICD10:  I48.0) The patient's CHA2DS2-VASc score is 1, indicating a 0.6% annual risk of stroke.   General education about afib provided and questions answered. We also discussed her stroke risk and the risks and benefits of anticoagulation. Patient spontaneously converted back to SR. We discussed rhythm control options including AAD and ablation.  She is interested in discussing ablation with EP, will refer.  In the interim, will start flecainide 50 mg BID as a bridge to ablation.  Continue Toprol 25 mg daily with Lopressor 25 mg PRN for heart  racing. Continue Eliquis 5 mg BID for now, may discontinue after 4 weeks post spontaneous conversion given low CV score. She understands she would need to resume this prior to ablation.  [employer] mobile for home monitoring.  2. Obstructive sleep apnea The importance of adequate treatment of sleep apnea was discussed today in order to improve our ability to maintain sinus rhythm long term. Patient reports compliance with CPAP therapy.    Follow up in the AF clinic Friday for ECG. Will also refer to EP for ablation consideration.    Jorja Loa PA-C Afib Clinic [facility] [address] [place], [place] [address] [phone] 03/09/2022 1:49 PM

## 2022-03-09 NOTE — Telephone Encounter (Signed)
Per telephone concern received 03/09/2022:   A-Fib Clinic contacted, and they had an open appointment to address patients A-Fib ( HR 140-130's ) today at 130 pm.    Pt called and confirmed she could make this appointment;  Kennyth Arnold RN made aware of Pt confirmation of appointment.  No f/u required at this time.

## 2022-03-09 NOTE — Patient Instructions (Signed)
Start Flecainide 50mg twice a day 

## 2022-03-09 NOTE — Telephone Encounter (Signed)
Patient c/o Palpitations:  High priority if patient c/o lightheadedness, shortness of breath, or chest pain  How long have you had palpitations/irregular HR/ Afib? Are you having the symptoms now?  She is in Afib right  Are you currently experiencing lightheadedness, SOB or CP?  Little shortness of breath  Do you have a history of afib (atrial fibrillation) or irregular heart rhythm? yes  Have you checked your BP or HR? (document readings if available):  131 the last time she took it   Are you experiencing any other symptoms? No other symptoms

## 2022-03-09 NOTE — Telephone Encounter (Signed)
Patient is concerned that her A-fib has become bothersome recently. She uses her [employer] Mobile to track her HR and it is "all over the place" She says she was told she could drink coffee and to use metoprolol tartrate as needed. She had previously avoided. She ate a lot of chocolate ice cream recently.   Her issue is she does not want to be on Eliquis the rest of her life as she feels it will "kill" her liver as she has fatty liver.    I will forward to Dr.Christopher for advice.

## 2022-03-11 ENCOUNTER — Ambulatory Visit (INDEPENDENT_AMBULATORY_CARE_PROVIDER_SITE_OTHER): Payer: Managed Care, Other (non HMO)

## 2022-03-11 DIAGNOSIS — Z23 Encounter for immunization: Secondary | ICD-10-CM | POA: Diagnosis not present

## 2022-03-11 NOTE — Progress Notes (Signed)
Pt was seen in office today for Shingrix injection. Vaccine administered in left deltoid and pt tolerated well. Pt given information to read up on Shingrix as well at visit.

## 2022-03-12 ENCOUNTER — Ambulatory Visit (HOSPITAL_COMMUNITY)
Admission: RE | Admit: 2022-03-12 | Discharge: 2022-03-12 | Disposition: A | Discharge status: Home / Self Care | Payer: Managed Care, Other (non HMO) | Source: Ambulatory Visit | Attending: Physician Assistant | Admitting: Physician Assistant

## 2022-03-12 VITALS — HR 66

## 2022-03-12 DIAGNOSIS — I480 Paroxysmal atrial fibrillation: Secondary | ICD-10-CM | POA: Insufficient documentation

## 2022-03-12 NOTE — Progress Notes (Signed)
Patient returns for ECG after starting flecainide. ECG shows SR HR 66 PR 136, QRS 84, QTc 392. She has not had any afib since starting the medication and is tolerating flecainide well. Will continue low dose flecainide 50 mg BID. If she does well on present dose, will do TST. Follow up with Dr Cristal Deer and Dr Elberta Fortis as scheduled.

## 2022-03-19 ENCOUNTER — Other Ambulatory Visit (HOSPITAL_COMMUNITY): Payer: Self-pay

## 2022-03-19 MED ORDER — APIXABAN 5 MG PO TABS: 5.0000 mg | ORAL_TABLET | Freq: Two times a day (BID) | ORAL | 0 refills | Status: DC

## 2022-03-24 ENCOUNTER — Ambulatory Visit (HOSPITAL_BASED_OUTPATIENT_CLINIC_OR_DEPARTMENT_OTHER): Payer: Managed Care, Other (non HMO) | Admitting: Cardiology

## 2022-03-25 ENCOUNTER — Ambulatory Visit (HOSPITAL_BASED_OUTPATIENT_CLINIC_OR_DEPARTMENT_OTHER): Payer: Managed Care, Other (non HMO) | Admitting: Cardiology

## 2022-03-25 ENCOUNTER — Encounter (HOSPITAL_BASED_OUTPATIENT_CLINIC_OR_DEPARTMENT_OTHER): Payer: Self-pay | Admitting: Cardiology

## 2022-03-25 VITALS — BP 132/76 | HR 52 | Ht 66.0 in | Wt 179.3 lb

## 2022-03-25 DIAGNOSIS — Z7189 Other specified counseling: Secondary | ICD-10-CM | POA: Diagnosis not present

## 2022-03-25 DIAGNOSIS — Z8616 Personal history of COVID-19: Secondary | ICD-10-CM | POA: Diagnosis not present

## 2022-03-25 DIAGNOSIS — I480 Paroxysmal atrial fibrillation: Secondary | ICD-10-CM | POA: Diagnosis not present

## 2022-03-25 DIAGNOSIS — Z8249 Family history of ischemic heart disease and other diseases of the circulatory system: Secondary | ICD-10-CM | POA: Diagnosis not present

## 2022-03-25 NOTE — Progress Notes (Signed)
Cardiology Office Note:    Date:  03/25/2022   ID:  Ariana Walsh, DOB [birth year], MRN [id number]  PCP:  [name], MD  Cardiologist:  Buford Dresser, MD  Referring MD: [name], MD   CC: follow up  History of Present Illness:    Ariana Walsh is a 51 y.o. female with a hx of paroxysmal atrial fibrillation, asthma who is seen for follow up today. I initially met her 05/25/21 as a new consult at the request of [name], MD for the evaluation and management of palpitations and pre-op clearance for hernia repair.  Family History: Father had CABG x4 in his 25s. "Everyone in her family" has CAD, heart attack, or stroke. CT from 2022 [facility], record scanned in) noted diffuse fatty liver infiltration.  Today: She reports that she is feeling down regarding her heart issues. She is experiencing a lot of anxiety regarding the ongoing pandemic and how safe she needs to be regarding the possibility of infection.    She did well all summer with her symptoms and taking her medications. She had been avoiding caffeine and alcohol. After her outpatient visit, she experienced two episodes of feeling like she was "fluttering". She felt frustrated after these episodes because she thought she could control her atrial fibrillation through her lifestyle changes.   She is concerned about when the best time for her ablation would be, especially in relation to the potential of another COVID infection. She is concerned whether the new COVID vaccination is safe for her or if it could cause more complications given her diagnosis.   She denies any chest pain, shortness of breath, or peripheral edema. No lightheadedness, headaches, syncope, orthopnea, or PND.    Past Medical History:  Diagnosis Date   DJD (degenerative joint disease), lumbar 11/22/2019   Xray 11/2019   Exercise-induced asthma 09/21/2018   Allergy induced also   Hernia, umbilical    Osteoarthritis,  hip, bilateral 11/22/2019   xrays 11/2019   Vitamin B12 deficiency 11/20/2019    Past Surgical History:  Procedure Laterality Date   DENTAL SURGERY     HERNIA REPAIR     IVF     egg collection    Current Medications: Current Outpatient Medications on File Prior to Visit  Medication Sig   apixaban (ELIQUIS) 5 MG TABS tablet Take 1 tablet (5 mg total) by mouth 2 (two) times daily.   Cholecalciferol (VITAMIN D) 50 MCG (2000 UT) CAPS Take 1 capsule by mouth daily at 12 noon.   flecainide (TAMBOCOR) 50 MG tablet Take 1 tablet (50 mg total) by mouth 2 (two) times daily.   metoprolol succinate (TOPROL XL) 25 MG 24 hr tablet Take 1 tablet (25 mg total) by mouth daily. Take with or immediately following a meal.   montelukast (SINGULAIR) 10 MG tablet TAKE ONE TABLET BY MOUTH AT BEDTIME   metoprolol tartrate (LOPRESSOR) 25 MG tablet Take 1 tablet (25 mg total) by mouth as needed (atrial fibrillation, palpitations).   No current facility-administered medications on file prior to visit.     Allergies:   Latex   Social History   Tobacco Use   Smoking status: Never   Smokeless tobacco: Never   Tobacco comments:    Never smoke 03/09/22  Vaping Use   Vaping Use: Never used  Substance Use Topics   Alcohol use: Yes    Alcohol/week: 0.0 - 3.0 standard drinks of alcohol   Drug use: No  Family History: family history includes Arthritis in her father; Breast cancer in her cousin and mother; Cancer (age of onset: 12) in her father; Diabetes in her father; Endometrial cancer in her mother; Healthy in her son; Heart disease in her father; Hypertension in her father and mother; Mental illness in her brother and father. There is no history of Colon cancer.  ROS:   Please see the history of present illness.   (+) palpitations (+) anxiety Additional pertinent ROS otherwise unremarkable.  EKGs/Labs/Other Studies Reviewed:    The following studies were reviewed today:  Coronary Calcium Scoring  12/30/2021: IMPRESSION: Coronary calcium score of 0.  Echo 11/29/2021: IMPRESSIONS    1. Mitral annulus diastolic velocities suggest normal diastolic function.  Left ventricular ejection fraction, by estimation, is 60 to 65%. The left  ventricle has normal function. The left ventricle has no regional wall  motion abnormalities. Left  ventricular diastolic function could not be evaluated.   2. Right ventricular systolic function is normal. The right ventricular  size is normal. There is normal pulmonary artery systolic pressure. The  estimated right ventricular systolic pressure is 94.8 mmHg.   3. The mitral valve is normal in structure. No evidence of mitral valve  regurgitation. No evidence of mitral stenosis.   4. The aortic valve is tricuspid. Aortic valve regurgitation is not  visualized. No aortic stenosis is present.   5. The inferior vena cava is normal in size with greater than 50%  respiratory variability, suggesting right atrial pressure of 3 mmHg.  Monitor 02/2021-03/2021: Patch Wear Time:  3 days and 0 hours    Predominant rhythm was sinus rhythm <1% ventricular and supraventricular ectopy No atrial fibrillation noted 9 SVT episodes, all less than 10 beats Symptoms/triggered events associated with sinus rhythm  EKG:  EKG is personally reviewed.   02/22/2022:sinus bradycardia at 52 bpm, Qt 438 ms 11/11/21: sinus bradycardia at 57 bpm, sinus arrhythmia (normal) 05/25/2021: sinus bradycardia at 56 bpm  Recent Labs: 11/29/2021: ALT 26; B Natriuretic Peptide 208.3; BUN 10; Creatinine, Ser 0.61; Hemoglobin 15.0; Magnesium 1.9; Platelets 267; Potassium 3.5; Sodium 138; TSH 3.785   Recent Lipid Panel    Component Value Date/Time   CHOL 156 02/23/2022 1154   TRIG 57.0 02/23/2022 1154   HDL 60.20 02/23/2022 1154   CHOLHDL 3 02/23/2022 1154   VLDL 11.4 02/23/2022 1154   LDLCALC 84 02/23/2022 1154    Physical Exam:    VS:  BP 132/76 (BP Location: Left Arm, Patient  Position: Sitting, Cuff Size: Large)   Pulse (!) 52   Ht 5' 6" (1.676 m)   Wt 179 lb 4.8 oz (81.3 kg)   LMP 01/25/2019   BMI 28.94 kg/m     Wt Readings from Last 3 Encounters:  03/25/22 179 lb 4.8 oz (81.3 kg)  03/09/22 181 lb 6.4 oz (82.3 kg)  02/23/22 180 lb (81.6 kg)    GEN: Well nourished, well developed in no acute distress HEENT: Normal, moist mucous membranes NECK: No JVD CARDIAC: regular rhythm, normal S1 and S2, no rubs or gallops. No murmur. VASCULAR: Radial and DP pulses 2+ bilaterally. No carotid bruits RESPIRATORY:  Clear to auscultation without rales, wheezing or rhonchi  ABDOMEN: Soft, non-tender, non-distended MUSCULOSKELETAL:  Ambulates independently SKIN: Warm and dry, no edema NEUROLOGIC:  Alert and oriented x 3. No focal neuro deficits noted. PSYCHIATRIC:  Normal affect     ASSESSMENT:    1. Paroxysmal atrial fibrillation ([place])   2. Personal history of COVID-19  3. Family history of heart disease   4. Cardiac risk counseling   5. Counseling on health promotion and disease prevention    PLAN:    Paroxysmal atrial fibrillation -now on flecainide -upcoming appt with Dr. Curt Bears to discuss ablation CHA2DS2/VAS Stroke Risk Points=1  -she is hoping that ultimately she will be able to come off of all medications if ablation is successful -for now, on flecainide for rhythm control pending evaluation for ablation -on metoprolol succinate daily and has PRN metoprolol tartrate for afib episodes -currently on apixaban pending ablation, continue  Family history of heart disease -calcium score 12/2021 = 0  History of fatty liver on CT scan.  -discussed recommendations for management, risk management  Intermittently elevated blood pressure -we discussed normal triggers that can raise blood pressure short term and that long term average is the goal of management -continue to monitor -for now, would not treat unless consistently >140/90. Reassess after  ablation  Covid education: we discussed vaccination timing at length today. There is no clear guidance on this, and her concern is that inflammation from the infection and/or vaccination may trigger her afib. 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 After shared decision making, she will get her covid booster prior to her ablation to try to minimize her risk of infection post ablation.  Cardiac risk counseling and prevention recommendations: -recommend heart healthy/Mediterranean diet, with whole grains, fruits, vegetable, fish, lean meats, nuts, and olive oil. Limit salt. -recommend moderate walking, 3-5 times/week for 30-50 minutes each session. Aim for at least 150 minutes.week. Goal should be pace of 3 miles/hours, or walking 1.5 miles in 30 minutes -recommend avoidance of tobacco products. Avoid excess alcohol. -ASCVD risk score: The 10-year ASCVD risk score (Arnett DK, et al., 2019) is: 1.1%   Values used to calculate the score:     Age: 51 years     Sex: Female     Is Non-Hispanic African American: No     Diabetic: No     Tobacco smoker: No     Systolic Blood Pressure: 469 mmHg     Is BP treated: No     HDL Cholesterol: 60.2 mg/dL     Total Cholesterol: 156 mg/dL    Plan for follow up: 6 months  Buford Dresser, MD, PhD, [facility]    Medication Adjustments/Labs and Tests Ordered: Current medicines are reviewed at length with the patient today.  Concerns regarding medicines are outlined above.   Orders Placed This Encounter  Procedures   EKG 12-Lead    No orders of the defined types were placed in this encounter.   Patient Instructions  Medication Instructions:  Your Physician recommend you continue on your current medication as directed.    *If you need a refill on your cardiac medications before your next appointment, please call your pharmacy*   Lab Work: None ordered  today   Testing/Procedures: None ordered today   Follow-Up: At [facility], you and your health needs are our priority.  As part of our continuing mission to provide you with exceptional heart care, we have created designated Provider Care Teams.  These Care Teams include your primary Cardiologist (physician) and Advanced Practice Providers (APPs -  Physician Assistants and Nurse Practitioners) who all work together to provide you with the care you need, when you need it.  We recommend signing up for the patient portal called "MyChart".  Sign up information is provided  on this After Visit Summary.  MyChart is used to connect with patients for Virtual Visits (Telemedicine).  Patients are able to view lab/test results, encounter notes, upcoming appointments, etc.  Non-urgent messages can be sent to your provider as well.   To learn more about what you can do with MyChart, go to [email or web address].    Your next appointment:   6 month(s)  The format for your next appointment:   In Person  Provider:   Buford Dresser, MD            Burnett Kanaris Ford,acting as a scribe for Buford Dresser, MD.,have documented all relevant documentation on the behalf of Buford Dresser, MD,as directed by  Buford Dresser, MD while in the presence of Buford Dresser, MD.   I, Buford Dresser, MD, have reviewed all documentation for this visit. The documentation on 03/26/22 for the exam, diagnosis, procedures, and orders are all accurate and complete.   Signed, Buford Dresser, MD PhD 03/25/2022     [place]

## 2022-03-25 NOTE — Patient Instructions (Signed)

## 2022-03-26 ENCOUNTER — Encounter (HOSPITAL_BASED_OUTPATIENT_CLINIC_OR_DEPARTMENT_OTHER): Payer: Self-pay | Admitting: Cardiology

## 2022-03-26 ENCOUNTER — Telehealth (HOSPITAL_BASED_OUTPATIENT_CLINIC_OR_DEPARTMENT_OTHER): Payer: Self-pay | Admitting: Cardiology

## 2022-03-26 NOTE — Telephone Encounter (Signed)
Message for patient to call and schedule 6 month follow up with Dr. Cristal Deer

## 2022-03-31 NOTE — Telephone Encounter (Signed)
Called to schedule patient's 6 months follow up with Dr. Harrell Gave

## 2022-04-05 ENCOUNTER — Encounter: Payer: Self-pay | Admitting: *Deleted

## 2022-04-09 NOTE — Telephone Encounter (Signed)
Left message for patient to call and schedule 6 month follow up with Dr. Harrell Gave

## 2022-04-16 NOTE — Telephone Encounter (Signed)
Left message for patient to call and schedule a 6 month follow up appt with Dr. Harrell Gave per 03/25/22 staff message from Isac Caddy, RN

## 2022-04-27 ENCOUNTER — Telehealth (HOSPITAL_BASED_OUTPATIENT_CLINIC_OR_DEPARTMENT_OTHER): Payer: Self-pay | Admitting: Cardiology

## 2022-04-27 NOTE — Telephone Encounter (Signed)
Patient states she will call mid November 2023 to schedule her 6 month follow up with Dr. Harrell Gave for April 2024

## 2022-05-03 ENCOUNTER — Encounter: Payer: Self-pay | Admitting: *Deleted

## 2022-05-03 ENCOUNTER — Ambulatory Visit: Payer: Managed Care, Other (non HMO) | Attending: Cardiology | Admitting: Cardiology

## 2022-05-03 ENCOUNTER — Encounter: Payer: Self-pay | Admitting: Cardiology

## 2022-05-03 VITALS — BP 136/70 | HR 58 | Ht 66.0 in | Wt 184.2 lb

## 2022-05-03 DIAGNOSIS — I480 Paroxysmal atrial fibrillation: Secondary | ICD-10-CM

## 2022-05-03 DIAGNOSIS — I4819 Other persistent atrial fibrillation: Secondary | ICD-10-CM

## 2022-05-03 NOTE — Progress Notes (Signed)
Electrophysiology Office Note   Date:  05/03/2022   ID:  Braylie, [place] [birth year], MRN [id number]  PCP:  [name], MD  Cardiologist:  Harrell Gave Primary Electrophysiologist:  Shakala Marlatt Meredith Leeds, MD    Chief Complaint: AF   History of Present Illness: Ariana Walsh is a 51 y.o. female who is being seen today for the evaluation of AF at the request of Fenton, Clint R, PA. Presenting today for electrophysiology evaluation.  She has a history significant for obstructive sleep apnea and atrial fibrillation.  She was diagnosed with atrial fibrillation 11/29/2019 after presenting to emergency room with palpitations, chest discomfort, shortness of breath.  EKG revealed atrial fibrillation.  She had a single dose of flecainide which converted her to sinus rhythm.  Unfortunately she has continued to have episodes of atrial fibrillation.  She unfortunately continues to have episodes of atrial fibrillation.  She is found no specific triggers.  She is compliant with her CPAP.  She denies alcohol use.  Today, she denies symptoms of palpitations, chest pain, shortness of breath, orthopnea, PND, lower extremity edema, claudication, dizziness, presyncope, syncope, bleeding, or neurologic sequela. The patient is tolerating medications without difficulties.    Past Medical History:  Diagnosis Date   DJD (degenerative joint disease), lumbar 11/22/2019   Xray 11/2019   Exercise-induced asthma 09/21/2018   Allergy induced also   Hernia, umbilical    Osteoarthritis, hip, bilateral 11/22/2019   xrays 11/2019   Vitamin B12 deficiency 11/20/2019   Past Surgical History:  Procedure Laterality Date   DENTAL SURGERY     HERNIA REPAIR     IVF     egg collection     Current Outpatient Medications  Medication Sig Dispense Refill   Cholecalciferol (VITAMIN D) 50 MCG (2000 UT) CAPS Take 1 capsule by mouth daily at 12 noon.     flecainide (TAMBOCOR) 50 MG tablet Take 1 tablet  (50 mg total) by mouth 2 (two) times daily. 60 tablet 3   metoprolol succinate (TOPROL XL) 25 MG 24 hr tablet Take 1 tablet (25 mg total) by mouth daily. Take with or immediately following a meal. 90 tablet 3   metoprolol tartrate (LOPRESSOR) 25 MG tablet Take 1 tablet (25 mg total) by mouth as needed (atrial fibrillation, palpitations). 30 tablet 1   montelukast (SINGULAIR) 10 MG tablet TAKE ONE TABLET BY MOUTH AT BEDTIME 30 tablet 3   apixaban (ELIQUIS) 5 MG TABS tablet Take 1 tablet (5 mg total) by mouth 2 (two) times daily. (Patient not taking: Reported on 05/03/2022) 14 tablet 0   No current facility-administered medications for this visit.    Allergies:   Latex   Social History:  The patient  reports that she has never smoked. She has never used smokeless tobacco. She reports current alcohol use. She reports that she does not use drugs.   Family History:  The patient's family history includes Arthritis in her father; Breast cancer in her cousin and mother; Cancer (age of onset: 21) in her father; Diabetes in her father; Endometrial cancer in her mother; Healthy in her son; Heart disease in her father; Hypertension in her father and mother; Mental illness in her brother and father.    ROS:  Please see the history of present illness.   Otherwise, review of systems is positive for none.   All other systems are reviewed and negative.    PHYSICAL EXAM: VS:  BP 136/70   Pulse (!) 58   Ht  5\' 6"  (1.676 m)   Wt 184 lb 3.2 oz (83.6 kg)   LMP 01/25/2019   SpO2 96%   BMI 29.73 kg/m  , BMI Body mass index is 29.73 kg/m. GEN: Well nourished, well developed, in no acute distress  HEENT: normal  Neck: no JVD, carotid bruits, or masses Cardiac: RRR; no murmurs, rubs, or gallops,no edema  Respiratory:  clear to auscultation bilaterally, normal work of breathing GI: soft, nontender, nondistended, + BS MS: no deformity or atrophy  Skin: warm and dry Neuro:  Strength and sensation are  intact Psych: euthymic mood, full affect  EKG:  EKG is not ordered today. Personal review of the ekg ordered 03/25/22 shows sinus rhythm  Recent Labs: 11/29/2021: ALT 26; B Natriuretic Peptide 208.3; BUN 10; Creatinine, Ser 0.61; Hemoglobin 15.0; Magnesium 1.9; Platelets 267; Potassium 3.5; Sodium 138; TSH 3.785    Lipid Panel     Component Value Date/Time   CHOL 156 02/23/2022 1154   TRIG 57.0 02/23/2022 1154   HDL 60.20 02/23/2022 1154   CHOLHDL 3 02/23/2022 1154   VLDL 11.4 02/23/2022 1154   LDLCALC 84 02/23/2022 1154     Wt Readings from Last 3 Encounters:  05/03/22 184 lb 3.2 oz (83.6 kg)  03/25/22 179 lb 4.8 oz (81.3 kg)  03/09/22 181 lb 6.4 oz (82.3 kg)      Other studies Reviewed: Additional studies/ records that were reviewed today include: TTE 11/29/21  Review of the above records today demonstrates:   1. Mitral annulus diastolic velocities suggest normal diastolic function.  Left ventricular ejection fraction, by estimation, is 60 to 65%. The left  ventricle has normal function. The left ventricle has no regional wall  motion abnormalities. Left  ventricular diastolic function could not be evaluated.   2. Right ventricular systolic function is normal. The right ventricular  size is normal. There is normal pulmonary artery systolic pressure. The  estimated right ventricular systolic pressure is 22.2 mmHg.   3. The mitral valve is normal in structure. No evidence of mitral valve  regurgitation. No evidence of mitral stenosis.   4. The aortic valve is tricuspid. Aortic valve regurgitation is not  visualized. No aortic stenosis is present.   5. The inferior vena cava is normal in size with greater than 50%  respiratory variability, suggesting right atrial pressure of 3 mmHg.    ASSESSMENT AND PLAN:  1.  Paroxysmal atrial fibrillation: CHA2DS2-VASc of 1.  Currently on flecainide 50 mg twice daily, Toprol-XL 25 mg daily.  She would prefer to avoid long-term  antiarrhythmic medications.  Due to that, we Miking Usrey plan for ablation.  Risk and benefits were discussed.  She understands these risks and is agreed to the procedure.  Risk, benefits, and alternatives to EP study and radiofrequency ablation for afib were also discussed in detail today. These risks include but are not limited to stroke, bleeding, vascular damage, tamponade, perforation, damage to the esophagus, lungs, and other structures, pulmonary vein stenosis, worsening renal function, and death. The patient understands these risk and wishes to proceed.  We Lynita Groseclose therefore proceed with catheter ablation at the next available time.  Carto, ICE, anesthesia are requested for the procedure.  Chevi Lim also obtain CT PV protocol prior to the procedure to exclude LAA thrombus and further evaluate atrial anatomy.  2.  Obstructive sleep apnea: CPAP compliance encouraged   Current medicines are reviewed at length with the patient today.   The patient does not have concerns regarding her medicines.  The following changes were made today:  none  Labs/ tests ordered today include:  Orders Placed This Encounter  Procedures   CT CARDIAC MORPH/PULM VEIN W/CM&W/O CA SCORE     Disposition:   FU with Ellyce Lafevers 3 months  Signed, Negin Hegg Jorja Loa, MD  05/03/2022 10:12 AM     [facility] [address] [place] [address] [phone] (office) [phone] (fax)

## 2022-05-03 NOTE — Patient Instructions (Addendum)
Medication Instructions:  Your physician has recommended you make the following change in your medication:  RESTART Eliquis on 09/06/2022  *If you need a refill on your cardiac medications before your next appointment, please call your pharmacy*   Lab Work: Pre procedure labs -- see procedure instruction letter:  BMP & CBC  If you have labs (blood work) drawn today and your tests are completely normal, you will receive your results only by: [place] (if you have MyChart) OR A paper copy in the mail If you have any lab test that is abnormal or we need to change your treatment, we will call you to review the results.   Testing/Procedures: Your physician has requested that you have cardiac CT within 7 days PRIOR to your ablation. Cardiac computed tomography (CT) is a painless test that uses an x-ray machine to take clear, detailed pictures of your heart.  Please follow instruction below located under "other instructions". You will get a call from our office to schedule the date for this test.  Your physician has recommended that you have an ablation. Catheter ablation is a medical procedure used to treat some cardiac arrhythmias (irregular heartbeats). During catheter ablation, a long, thin, flexible tube is put into a blood vessel in your groin (upper thigh), or neck. This tube is called an ablation catheter. It is then guided to your heart through the blood vessel. Radio frequency waves destroy small areas of heart tissue where abnormal heartbeats may cause an arrhythmia to start. Please follow instruction letter given to you today.   Follow-Up: At [facility], you and your health needs are our priority.  As part of our continuing mission to provide you with exceptional heart care, we have created designated Provider Care Teams.  These Care Teams include your primary Cardiologist (physician) and Advanced Practice Providers (APPs -  Physician Assistants and Nurse Practitioners) who all  work together to provide you with the care you need, when you need it.   Your next appointment:   1 month(s) after your ablation  The format for your next appointment:   In Person  Provider:   AFib clinic   Thank you for choosing [facility]!!   Trinidad Curet, RN [phone]    Other Instructions   Cardiac Ablation Cardiac ablation is a procedure to destroy (ablate) some heart tissue that is sending bad signals. These bad signals cause problems in heart rhythm. The heart has many areas that make these signals. If there are problems in these areas, they can make the heart beat in a way that is not normal. Destroying some tissues can help make the heart rhythm normal. Tell your doctor about: Any allergies you have. All medicines you are taking. These include vitamins, herbs, eye drops, creams, and over-the-counter medicines. Any problems you or family members have had with medicines that make you fall asleep (anesthetics). Any blood disorders you have. Any surgeries you have had. Any medical conditions you have, such as kidney failure. Whether you are pregnant or may be pregnant. What are the risks? This is a safe procedure. But problems may occur, including: Infection. Bruising and bleeding. Bleeding into the chest. Stroke or blood clots. Damage to nearby areas of your body. Allergies to medicines or dyes. The need for a pacemaker if the normal system is damaged. Failure of the procedure to treat the problem. What happens before the procedure? Medicines Ask your doctor about: Changing or stopping your normal medicines. This is important. Taking aspirin and  ibuprofen. Do not take these medicines unless your doctor tells you to take them. Taking other medicines, vitamins, herbs, and supplements. General instructions Follow instructions from your doctor about what you cannot eat or drink. Plan to have someone take you home from the hospital or clinic. If you will  be going home right after the procedure, plan to have someone with you for 24 hours. Ask your doctor what steps will be taken to prevent infection. What happens during the procedure?  An IV tube will be put into one of your veins. You will be given a medicine to help you relax. The skin on your neck or groin will be numbed. A cut (incision) will be made in your neck or groin. A needle will be put through your cut and into a large vein. A tube (catheter) will be put into the needle. The tube will be moved to your heart. Dye may be put through the tube. This helps your doctor see your heart. Small devices (electrodes) on the tube will send out signals. A type of energy will be used to destroy some heart tissue. The tube will be taken out. Pressure will be held on your cut. This helps stop bleeding. A bandage will be put over your cut. The exact procedure may vary among doctors and hospitals. What happens after the procedure? You will be watched until you leave the hospital or clinic. This includes checking your heart rate, breathing rate, oxygen, and blood pressure. Your cut will be watched for bleeding. You will need to lie still for a few hours. Do not drive for 24 hours or as long as your doctor tells you. Summary Cardiac ablation is a procedure to destroy some heart tissue. This is done to treat heart rhythm problems. Tell your doctor about any medical conditions you may have. Tell him or her about all medicines you are taking to treat them. This is a safe procedure. But problems may occur. These include infection, bruising, bleeding, and damage to nearby areas of your body. Follow what your doctor tells you about food and drink. You may also be told to change or stop some of your medicines. After the procedure, do not drive for 24 hours or as long as your doctor tells you. This information is not intended to replace advice given to you by your health care provider. Make sure you discuss  any questions you have with your health care provider. Document Revised: 09/18/2021 Document Reviewed: 05/31/2019 Elsevier Patient Education  2023 [employer].

## 2022-05-28 ENCOUNTER — Encounter: Payer: Self-pay | Admitting: Internal Medicine

## 2022-05-28 NOTE — Telephone Encounter (Signed)
No problem at all. Have a good trip !

## 2022-06-14 ENCOUNTER — Other Ambulatory Visit: Payer: Self-pay | Admitting: Family Medicine

## 2022-06-17 ENCOUNTER — Ambulatory Visit (INDEPENDENT_AMBULATORY_CARE_PROVIDER_SITE_OTHER): Payer: Managed Care, Other (non HMO)

## 2022-06-17 DIAGNOSIS — Z23 Encounter for immunization: Secondary | ICD-10-CM

## 2022-06-24 ENCOUNTER — Encounter: Payer: Self-pay | Admitting: *Deleted

## 2022-07-02 ENCOUNTER — Other Ambulatory Visit (HOSPITAL_COMMUNITY): Payer: Self-pay | Admitting: Physician Assistant

## 2022-07-13 ENCOUNTER — Encounter: Payer: Self-pay | Admitting: Family Medicine

## 2022-07-13 ENCOUNTER — Other Ambulatory Visit: Payer: Self-pay | Admitting: Family Medicine

## 2022-07-13 DIAGNOSIS — Z1231 Encounter for screening mammogram for malignant neoplasm of breast: Secondary | ICD-10-CM

## 2022-08-26 ENCOUNTER — Telehealth: Payer: Self-pay | Admitting: Cardiology

## 2022-08-26 MED ORDER — APIXABAN 5 MG PO TABS: 5.0000 mg | ORAL_TABLET | Freq: Two times a day (BID) | ORAL | 1 refills | Status: DC

## 2022-08-26 NOTE — Telephone Encounter (Signed)
Patient c/o Palpitations:  High priority if patient c/o lightheadedness, shortness of breath, or chest pain  How long have you had palpitations/irregular HR/ Afib? Are you having the symptoms now? Started around 8:15 pm last night and carried into this morning until around 9:30 am. Is having symptoms of tachycardia now   Are you currently experiencing lightheadedness, SOB or CP? A little lightheaded, but has been active this morning   Do you have a history of afib (atrial fibrillation) or irregular heart rhythm? Yes  Have you checked your BP or HR? (document readings if available): Is unable to check right now due to being at school   Are you experiencing any other symptoms? Not having any other symptoms

## 2022-08-26 NOTE — Telephone Encounter (Signed)
Spoke with the patient who states that she went into AFIB last night. She was seeing a show at the [facility] and could tell that she was in AFIB. At intermission she used her [facility] which confirmed AFIB with heart rate in the 120s. She took her Lopressor 25 mg. She states that she remained in AFIB through out the night until this morning. She has taken her flecainide and Toprol this morning. She states that it is possible that she missed a dose of these medications yesterday morning but she is not sure. She states that around 9:30am this morning her [name] mobile was no longer showing AFIB, just tachycardia. She states last reading showed heart rate at 121. She has not taken any Lopressor today. Advised patient to go ahead and take one tablet. She is not currently taking Eliquis. She is seeing Dr. Baird Kay on 2/26 and has an ablation scheduled for 3/21. She was going to restart Eliquis at her appointment with Dr. Curt Bears. Advised that if she is willing to go ahead and restart it to be safe. Patient will continue to monitor heart rate and rhythm and let us know if she goes back out of rhythm or if heart rate remains elevated.

## 2022-09-02 ENCOUNTER — Ambulatory Visit: Payer: Managed Care, Other (non HMO)

## 2022-09-06 ENCOUNTER — Ambulatory Visit: Payer: Managed Care, Other (non HMO) | Attending: Cardiology | Admitting: Cardiology

## 2022-09-06 ENCOUNTER — Ambulatory Visit: Payer: Managed Care, Other (non HMO) | Admitting: Cardiology

## 2022-09-06 ENCOUNTER — Encounter: Payer: Self-pay | Admitting: Cardiology

## 2022-09-06 VITALS — BP 140/76 | HR 55 | Ht 66.0 in | Wt 189.0 lb

## 2022-09-06 DIAGNOSIS — I4819 Other persistent atrial fibrillation: Secondary | ICD-10-CM

## 2022-09-06 DIAGNOSIS — G4733 Obstructive sleep apnea (adult) (pediatric): Secondary | ICD-10-CM

## 2022-09-06 DIAGNOSIS — Z01812 Encounter for preprocedural laboratory examination: Secondary | ICD-10-CM | POA: Diagnosis not present

## 2022-09-06 NOTE — Progress Notes (Signed)
Electrophysiology Office Note   Date:  09/06/2022   ID:  Ariana, Walsh [id number], MRN [id number]  PCP:  Ariana Arnt, MD  Cardiologist:  Ariana Walsh Primary Electrophysiologist:  Ariana Walsh Ariana Leeds, MD    Chief Complaint: AF   History of Present Illness: Ariana Walsh is a 52 y.o. female who is being seen today for the evaluation of AF at the request of Ariana Arnt, MD. Presenting today for electrophysiology evaluation.  She has a history significant for obstructive sleep apnea and atrial fibrillation.  She was diagnosed with atrial fibrillation 11/29/2019 after presenting to emergency room with palpitations, chest discomfort, shortness of breath.  EKG revealed atrial fibrillation.  She had a single dose of flecainide which converted to sinus rhythm.  She did continue to have episodes of atrial fibrillation.  She is now scheduled for ablation 09/30/2022.  Today, denies symptoms of palpitations, chest pain, shortness of breath, orthopnea, PND, lower extremity edema, claudication, dizziness, presyncope, syncope, bleeding, or neurologic sequela. The patient is tolerating medications without difficulties.     Past Medical History:  Diagnosis Date   DJD (degenerative joint disease), lumbar 11/22/2019   Xray 11/2019   Exercise-induced asthma 09/21/2018   Allergy induced also   Hernia, umbilical    Osteoarthritis, hip, bilateral 11/22/2019   xrays 11/2019   Vitamin B12 deficiency 11/20/2019   Past Surgical History:  Procedure Laterality Date   DENTAL SURGERY     HERNIA REPAIR     IVF     egg collection     Current Outpatient Medications  Medication Sig Dispense Refill   apixaban (ELIQUIS) 5 MG TABS tablet Take 1 tablet (5 mg total) by mouth 2 (two) times daily. 180 tablet 1   Cholecalciferol (VITAMIN D) 50 MCG (2000 UT) CAPS Take 1 capsule by mouth daily at 12 noon.     flecainide (TAMBOCOR) 50 MG tablet Take 1 tablet (50 mg total) by mouth 2 (two)  times daily. 60 tablet 3   metoprolol succinate (TOPROL XL) 25 MG 24 hr tablet Take 1 tablet (25 mg total) by mouth daily. Take with or immediately following a meal. 90 tablet 3   montelukast (SINGULAIR) 10 MG tablet TAKE ONE TABLET BY MOUTH AT BEDTIME 30 tablet 3   metoprolol tartrate (LOPRESSOR) 25 MG tablet Take 1 tablet (25 mg total) by mouth as needed (atrial fibrillation, palpitations). 30 tablet 1   No current facility-administered medications for this visit.    Allergies:   Latex   Social History:  The patient  reports that she has never smoked. She has never used smokeless tobacco. She reports current alcohol use. She reports that she does not use drugs.   Family History:  The patient's family history includes Arthritis in her father; Breast cancer in her cousin and mother; Cancer (age of onset: 47) in her father; Diabetes in her father; Endometrial cancer in her mother; Healthy in her son; Heart disease in her father; Hypertension in her father and mother; Mental illness in her brother and father.   ROS:  Please see the history of present illness.   Otherwise, review of systems is positive for none.   All other systems are reviewed and negative.   PHYSICAL EXAM: VS:  BP (!) 140/76   Pulse (!) 55   Ht '5\' 6"'$  (1.676 m)   Wt 189 lb (85.7 kg)   LMP 01/25/2019   SpO2 96%   BMI 30.51 kg/m  , BMI Body mass index  is 30.51 kg/m. GEN: Well nourished, well developed, in no acute distress  HEENT: normal  Neck: no JVD, carotid bruits, or masses Cardiac: RRR; no murmurs, rubs, or gallops,no edema  Respiratory:  clear to auscultation bilaterally, normal work of breathing GI: soft, nontender, nondistended, + BS MS: no deformity or atrophy  Skin: warm and dry Neuro:  Strength and sensation are intact Psych: euthymic mood, full affect  EKG:  EKG is ordered today. Personal review of the ekg ordered shows sinus rhythm   Recent Labs: 11/29/2021: ALT 26; B Natriuretic Peptide 208.3; BUN  10; Creatinine, Ser 0.61; Hemoglobin 15.0; Magnesium 1.9; Platelets 267; Potassium 3.5; Sodium 138; TSH 3.785    Lipid Panel     Component Value Date/Time   CHOL 156 02/23/2022 1154   TRIG 57.0 02/23/2022 1154   HDL 60.20 02/23/2022 1154   CHOLHDL 3 02/23/2022 1154   VLDL 11.4 02/23/2022 1154   LDLCALC 84 02/23/2022 1154     Wt Readings from Last 3 Encounters:  09/06/22 189 lb (85.7 kg)  05/03/22 184 lb 3.2 oz (83.6 kg)  03/25/22 179 lb 4.8 oz (81.3 kg)      Other studies Reviewed: Additional studies/ records that were reviewed today include: TTE 11/29/21  Review of the above records today demonstrates:   1. Mitral annulus diastolic velocities suggest normal diastolic function.  Left ventricular ejection fraction, by estimation, is 60 to 65%. The left  ventricle has normal function. The left ventricle has no regional wall  motion abnormalities. Left  ventricular diastolic function could not be evaluated.   2. Right ventricular systolic function is normal. The right ventricular  size is normal. There is normal pulmonary artery systolic pressure. The  estimated right ventricular systolic pressure is [id number] mmHg.   3. The mitral valve is normal in structure. No evidence of mitral valve  regurgitation. No evidence of mitral stenosis.   4. The aortic valve is tricuspid. Aortic valve regurgitation is not  visualized. No aortic stenosis is present.   5. The inferior vena cava is normal in size with greater than 50%  respiratory variability, suggesting right atrial pressure of 3 mmHg.    ASSESSMENT AND PLAN:  1.  Paroxysmal atrial fibrillation: Currently on flecainide, Toprol-XL.  CHA2DS2-VASc of 1.  She has plans for ablation 09/30/2022.  All questions were answered today.  Emelee Rodocker start her on Eliquis prior to ablation.  CT scan has been scheduled.  2.  Obstructive sleep apnea: CPAP compliance encouraged   Current medicines are reviewed at length with the patient today.   The  patient does not have concerns regarding her medicines.  The following changes were made today:  none  Labs/ tests ordered today include:  Orders Placed This Encounter  Procedures   Basic metabolic panel   CBC   EKG 12-Lead     Disposition:   FU 3 months  Signed, Dynastie Knoop Ariana Leeds, MD  09/06/2022 3:06 PM     [facility] [address] [place] [address] [phone] (office) [phone] (fax)

## 2022-09-06 NOTE — Patient Instructions (Addendum)
Medication Instructions:  Your physician recommends that you continue on your current medications as directed. Please refer to the Current Medication list given to you today.  *If you need a refill on your cardiac medications before your next appointment, please call your pharmacy*   Lab Work: Pre procedure labs today: BMET & CBC  If you have labs (blood work) drawn today and your tests are completely normal, you will receive your results only by: [place] (if you have MyChart) OR A paper copy in the mail If you have any lab test that is abnormal or we need to change your treatment, we will call you to review the results.   Testing/Procedures: None ordered   Follow-Up: At [facility], you and your health needs are our priority.  As part of our continuing mission to provide you with exceptional heart care, we have created designated Provider Care Teams.  These Care Teams include your primary Cardiologist (physician) and Advanced Practice Providers (APPs -  Physician Assistants and Nurse Practitioners) who all work together to provide you with the care you need, when you need it.  Your next appointment:   Our scheduler will call you to arrange your post ablation follow up appointments  The format for your next appointment:   In Person  Provider:   Allegra Lai, MD{    Thank you for choosing [facility]!!   Trinidad Curet, RN [phone]

## 2022-09-06 NOTE — H&P (View-Only) (Signed)
 Electrophysiology Office Note   Date:  09/06/2022   ID:  Ariana Walsh, DOB 09/18/1970, MRN [id number]  PCP:  Ariana Walsh, Ariana L, MD  Cardiologist:  Ariana Walsh:  Ariana Byrom Martin Erich Kochan, MD    Chief Complaint: AF   History of Present Illness: Ariana Walsh is a 51 y.o. female who is being seen today for the evaluation of AF at the request of Ariana Walsh, Ariana L, MD. Presenting today for electrophysiology evaluation.  She has a history significant for obstructive sleep apnea and atrial fibrillation.  She was diagnosed with atrial fibrillation 11/29/2019 after presenting to emergency room with palpitations, chest discomfort, shortness of breath.  EKG revealed atrial fibrillation.  She had a single dose of flecainide which converted to sinus rhythm.  She did continue to have episodes of atrial fibrillation.  She is now scheduled for ablation 09/30/2022.  Today, denies symptoms of palpitations, chest pain, shortness of breath, orthopnea, PND, lower extremity edema, claudication, dizziness, presyncope, syncope, bleeding, or neurologic sequela. The patient is tolerating medications without difficulties.     Past Medical History:  Diagnosis Date   DJD (degenerative joint disease), lumbar 11/22/2019   Xray 11/2019   Exercise-induced asthma 09/21/2018   Allergy induced also   Hernia, umbilical    Osteoarthritis, hip, bilateral 11/22/2019   xrays 11/2019   Vitamin B12 deficiency 11/20/2019   Past Surgical History:  Procedure Laterality Date   DENTAL SURGERY     HERNIA REPAIR     IVF     egg collection     Current Outpatient Medications  Medication Sig Dispense Refill   apixaban (ELIQUIS) 5 MG TABS tablet Take 1 tablet (5 mg total) by mouth 2 (two) times daily. 180 tablet 1   Cholecalciferol (VITAMIN D) 50 MCG (2000 UT) CAPS Take 1 capsule by mouth daily at 12 noon.     flecainide (TAMBOCOR) 50 MG tablet Take 1 tablet (50 mg total) by mouth 2 (two)  times daily. 60 tablet 3   metoprolol succinate (TOPROL XL) 25 MG 24 hr tablet Take 1 tablet (25 mg total) by mouth daily. Take with or immediately following a meal. 90 tablet 3   montelukast (SINGULAIR) 10 MG tablet TAKE ONE TABLET BY MOUTH AT BEDTIME 30 tablet 3   metoprolol tartrate (LOPRESSOR) 25 MG tablet Take 1 tablet (25 mg total) by mouth as needed (atrial fibrillation, palpitations). 30 tablet 1   No current facility-administered medications for this visit.    Allergies:   Latex   Social History:  The patient  reports that she has never smoked. She has never used smokeless tobacco. She reports current alcohol use. She reports that she does not use drugs.   Family History:  The patient's family history includes Arthritis in her father; Breast cancer in her cousin and mother; Cancer (age of onset: 82) in her father; Diabetes in her father; Endometrial cancer in her mother; Healthy in her son; Heart disease in her father; Hypertension in her father and mother; Mental illness in her brother and father.   ROS:  Please see the history of present illness.   Otherwise, review of systems is positive for none.   All other systems are reviewed and negative.   PHYSICAL EXAM: VS:  BP (!) 140/76   Pulse (!) 55   Ht 5' 6" (1.676 m)   Wt 189 lb (85.7 kg)   LMP 01/25/2019   SpO2 96%   BMI 30.51 kg/m  , BMI Body mass index   is 30.51 kg/m. GEN: Well nourished, well developed, in no acute distress  HEENT: normal  Neck: no JVD, carotid bruits, or masses Cardiac: RRR; no murmurs, rubs, or gallops,no edema  Respiratory:  clear to auscultation bilaterally, normal work of breathing GI: soft, nontender, nondistended, + BS MS: no deformity or atrophy  Skin: warm and dry Neuro:  Strength and sensation are intact Psych: euthymic mood, full affect  EKG:  EKG is ordered today. Personal review of the ekg ordered shows sinus rhythm   Recent Labs: 11/29/2021: ALT 26; B Natriuretic Peptide 208.3; BUN  10; Creatinine, Ser 0.61; Hemoglobin 15.0; Magnesium 1.9; Platelets 267; Potassium 3.5; Sodium 138; TSH 3.785    Lipid Panel     Component Value Date/Time   CHOL 156 02/23/2022 1154   TRIG 57.0 02/23/2022 1154   HDL 60.20 02/23/2022 1154   CHOLHDL 3 02/23/2022 1154   VLDL 11.4 02/23/2022 1154   LDLCALC 84 02/23/2022 1154     Wt Readings from Last 3 Encounters:  09/06/22 189 lb (85.7 kg)  05/03/22 184 lb 3.2 oz (83.6 kg)  03/25/22 179 lb 4.8 oz (81.3 kg)      Other studies Reviewed: Additional studies/ records that were reviewed today include: TTE 11/29/21  Review of the above records today demonstrates:   1. Mitral annulus diastolic velocities suggest normal diastolic function.  Left ventricular ejection fraction, by estimation, is 60 to 65%. The left  ventricle has normal function. The left ventricle has no regional wall  motion abnormalities. Left  ventricular diastolic function could not be evaluated.   2. Right ventricular systolic function is normal. The right ventricular  size is normal. There is normal pulmonary artery systolic pressure. The  estimated right ventricular systolic pressure is 22.2 mmHg.   3. The mitral valve is normal in structure. No evidence of mitral valve  regurgitation. No evidence of mitral stenosis.   4. The aortic valve is tricuspid. Aortic valve regurgitation is not  visualized. No aortic stenosis is present.   5. The inferior vena cava is normal in size with greater than 50%  respiratory variability, suggesting right atrial pressure of 3 mmHg.    ASSESSMENT AND PLAN:  1.  Paroxysmal atrial fibrillation: Currently on flecainide, Toprol-XL.  CHA2DS2-VASc of 1.  She has plans for ablation 09/30/2022.  All questions were answered today.  Ariana Walsh start her on Eliquis prior to ablation.  CT scan has been scheduled.  2.  Obstructive sleep apnea: CPAP compliance encouraged   Current medicines are reviewed at length with the patient today.   The  patient does not have concerns regarding her medicines.  The following changes were made today:  none  Labs/ tests ordered today include:  Orders Placed This Encounter  Procedures   Basic metabolic panel   CBC   EKG 12-Lead     Disposition:   FU 3 months  Signed, Indigo Barbian Martin Neftali Abair, MD  09/06/2022 3:06 PM     [facility] [address] [place] [address] [phone] (office) [phone] (fax) 

## 2022-09-07 LAB — BASIC METABOLIC PANEL
BUN/Creatinine Ratio: 20 (ref 9–23)
BUN: 14 mg/dL (ref 6–24)
CO2: 24 mmol/L (ref 20–29)
Calcium: 9.7 mg/dL (ref 8.7–10.2)
Chloride: 103 mmol/L (ref 96–106)
Creatinine, Ser: 0.71 mg/dL (ref 0.57–1.00)
Glucose: 85 mg/dL (ref 70–99)
Potassium: 4 mmol/L (ref 3.5–5.2)
Sodium: 142 mmol/L (ref 134–144)
eGFR: 102 mL/min/{1.73_m2} (ref >59)

## 2022-09-07 LAB — CBC
Hematocrit: 42 % (ref 34.0–46.6)
Hemoglobin: 14.2 g/dL (ref 11.1–15.9)
MCH: 32.3 pg (ref 26.6–33.0)
MCHC: 33.8 g/dL (ref 31.5–35.7)
MCV: 96 fL (ref 79–97)
Platelets: 288 10*3/uL (ref 150–450)
RBC: 4.39 x10E6/uL (ref 3.77–5.28)
RDW: 12.3 % (ref 11.7–15.4)
WBC: 5.9 10*3/uL (ref 3.4–10.8)

## 2022-09-08 ENCOUNTER — Ambulatory Visit
Admission: RE | Admit: 2022-09-08 | Discharge: 2022-09-08 | Disposition: A | Discharge status: Home / Self Care | Payer: Managed Care, Other (non HMO) | Source: Ambulatory Visit | Attending: Family Medicine | Admitting: Family Medicine

## 2022-09-08 DIAGNOSIS — Z1231 Encounter for screening mammogram for malignant neoplasm of breast: Secondary | ICD-10-CM

## 2022-09-23 ENCOUNTER — Other Ambulatory Visit: Payer: Self-pay | Admitting: Cardiology

## 2022-09-23 ENCOUNTER — Ambulatory Visit (HOSPITAL_BASED_OUTPATIENT_CLINIC_OR_DEPARTMENT_OTHER): Payer: Managed Care, Other (non HMO)

## 2022-09-23 DIAGNOSIS — I480 Paroxysmal atrial fibrillation: Secondary | ICD-10-CM

## 2022-09-23 NOTE — Telephone Encounter (Signed)
Eliquis '5mg'$  refill request received. Patient is 52 years old, weight-85.7kg, Crea-0.71 on 09/06/22, Diagnosis-Afib, and last seen by Dr. Curt Bears on 09/06/22. Dose is appropriate based on dosing criteria. Will send in refill to requested pharmacy.

## 2022-09-24 ENCOUNTER — Telehealth (HOSPITAL_COMMUNITY): Payer: Self-pay | Admitting: *Deleted

## 2022-09-24 NOTE — Telephone Encounter (Signed)
Reaching out to patient to offer assistance regarding upcoming cardiac imaging study; pt verbalizes understanding of appt date/time, parking situation and where to check in, pre-test NPO status  and verified current allergies; name and call back number provided for further questions should they arise ? ?Berma Harts RN Navigator Cardiac Imaging ?[facility] ?[phone] office ?[phone] cell ? ?

## 2022-09-24 NOTE — Telephone Encounter (Signed)
Attempted to call patient regarding upcoming cardiac CT appointment. °Left message on voicemail with name and callback number ° °Elia Nunley RN Navigator Cardiac Imaging °[facility] °[phone] Office °[phone] Cell ° °

## 2022-09-27 ENCOUNTER — Ambulatory Visit (HOSPITAL_BASED_OUTPATIENT_CLINIC_OR_DEPARTMENT_OTHER)
Admission: RE | Admit: 2022-09-27 | Discharge: 2022-09-27 | Disposition: A | Discharge status: Home / Self Care | Payer: Managed Care, Other (non HMO) | Source: Ambulatory Visit | Attending: Cardiology | Admitting: Cardiology

## 2022-09-27 DIAGNOSIS — I4819 Other persistent atrial fibrillation: Secondary | ICD-10-CM | POA: Diagnosis present

## 2022-09-27 MED ORDER — IOHEXOL 350 MG/ML SOLN
80.0000 mL | Freq: Once | INTRAVENOUS | Status: AC | PRN
Start: 2022-09-27 — End: 2022-09-27
  Administered 2022-09-27: 80 mL via INTRAVENOUS

## 2022-09-29 NOTE — Pre-Procedure Instructions (Signed)
Attempted to call patient regarding procedure instructions.  Left voicemail on the following items: Arrival time 0800 Nothing to eat or drink after midnight No meds AM of procedure Responsible person to drive you home and stay with you for 24 hrs  Have you missed any doses of anti-coagulant Eliquis- if you have missed any doses please let office know right away.  Take both doses today.  Do not take dose in the morning.

## 2022-09-30 ENCOUNTER — Other Ambulatory Visit: Payer: Self-pay

## 2022-09-30 ENCOUNTER — Ambulatory Visit (HOSPITAL_COMMUNITY)
Admission: RE | Admit: 2022-09-30 | Discharge: 2022-09-30 | Disposition: A | Discharge status: Home / Self Care | Payer: Managed Care, Other (non HMO) | Source: Ambulatory Visit | Attending: Cardiology | Admitting: Cardiology

## 2022-09-30 ENCOUNTER — Encounter (HOSPITAL_COMMUNITY): Payer: Self-pay | Admitting: Cardiology

## 2022-09-30 ENCOUNTER — Ambulatory Visit (HOSPITAL_COMMUNITY): Payer: Managed Care, Other (non HMO) | Admitting: Anesthesiology

## 2022-09-30 ENCOUNTER — Ambulatory Visit (HOSPITAL_BASED_OUTPATIENT_CLINIC_OR_DEPARTMENT_OTHER): Payer: Managed Care, Other (non HMO) | Admitting: Anesthesiology

## 2022-09-30 ENCOUNTER — Encounter (HOSPITAL_COMMUNITY)
Admission: RE | Disposition: A | Discharge status: Home / Self Care | Payer: Managed Care, Other (non HMO) | Source: Ambulatory Visit | Attending: Cardiology

## 2022-09-30 DIAGNOSIS — Z7901 Long term (current) use of anticoagulants: Secondary | ICD-10-CM | POA: Diagnosis not present

## 2022-09-30 DIAGNOSIS — G4733 Obstructive sleep apnea (adult) (pediatric): Secondary | ICD-10-CM | POA: Diagnosis not present

## 2022-09-30 DIAGNOSIS — I480 Paroxysmal atrial fibrillation: Secondary | ICD-10-CM | POA: Insufficient documentation

## 2022-09-30 DIAGNOSIS — J45909 Unspecified asthma, uncomplicated: Secondary | ICD-10-CM | POA: Diagnosis not present

## 2022-09-30 DIAGNOSIS — I4891 Unspecified atrial fibrillation: Secondary | ICD-10-CM | POA: Diagnosis not present

## 2022-09-30 DIAGNOSIS — Z8249 Family history of ischemic heart disease and other diseases of the circulatory system: Secondary | ICD-10-CM | POA: Insufficient documentation

## 2022-09-30 DIAGNOSIS — Z79899 Other long term (current) drug therapy: Secondary | ICD-10-CM | POA: Insufficient documentation

## 2022-09-30 HISTORY — PX: ATRIAL FIBRILLATION ABLATION: EP1191

## 2022-09-30 LAB — POCT ACTIVATED CLOTTING TIME
Activated Clotting Time: 298 seconds
Activated Clotting Time: 347 seconds

## 2022-09-30 SURGERY — ATRIAL FIBRILLATION ABLATION
Anesthesia: General

## 2022-09-30 MED ORDER — LIDOCAINE 2% (20 MG/ML) 5 ML SYRINGE
INTRAMUSCULAR | Status: DC | PRN
  Administered 2022-09-30: 40 mg via INTRAVENOUS

## 2022-09-30 MED ORDER — SUGAMMADEX SODIUM 200 MG/2ML IV SOLN
INTRAVENOUS | Status: DC | PRN
  Administered 2022-09-30: 200 mg via INTRAVENOUS

## 2022-09-30 MED ORDER — SODIUM CHLORIDE 0.9 % IV SOLN: INTRAVENOUS | Status: DC

## 2022-09-30 MED ORDER — DOBUTAMINE INFUSION FOR EP/ECHO/NUC (1000 MCG/ML)
INTRAVENOUS | Status: DC | PRN
  Administered 2022-09-30: 20 ug/kg/min via INTRAVENOUS

## 2022-09-30 MED ORDER — HEPARIN SODIUM (PORCINE) 1000 UNIT/ML IJ SOLN
INTRAMUSCULAR | Status: DC | PRN
  Administered 2022-09-30: 14000 [IU] via INTRAVENOUS
  Administered 2022-09-30: 4000 [IU] via INTRAVENOUS

## 2022-09-30 MED ORDER — PROTAMINE SULFATE 10 MG/ML IV SOLN
INTRAVENOUS | Status: DC | PRN
  Administered 2022-09-30: 20 mg via INTRAVENOUS
  Administered 2022-09-30: 10 mg via INTRAVENOUS

## 2022-09-30 MED ORDER — HEPARIN SODIUM (PORCINE) 1000 UNIT/ML IJ SOLN
INTRAMUSCULAR | Status: AC
  Filled 2022-09-30: qty 10

## 2022-09-30 MED ORDER — ACETAMINOPHEN 325 MG PO TABS: 650.0000 mg | ORAL_TABLET | ORAL | Status: DC | PRN

## 2022-09-30 MED ORDER — DEXAMETHASONE SODIUM PHOSPHATE 10 MG/ML IJ SOLN
INTRAMUSCULAR | Status: DC | PRN
  Administered 2022-09-30: 10 mg via INTRAVENOUS

## 2022-09-30 MED ORDER — ONDANSETRON HCL 4 MG/2ML IJ SOLN: 4.0000 mg | Freq: Four times a day (QID) | INTRAMUSCULAR | Status: DC | PRN

## 2022-09-30 MED ORDER — HEPARIN SODIUM (PORCINE) 1000 UNIT/ML IJ SOLN
INTRAMUSCULAR | Status: DC | PRN
  Administered 2022-09-30: 1000 [IU] via INTRAVENOUS

## 2022-09-30 MED ORDER — MIDAZOLAM HCL 2 MG/2ML IJ SOLN
INTRAMUSCULAR | Status: DC | PRN
  Administered 2022-09-30: 2 mg via INTRAVENOUS

## 2022-09-30 MED ORDER — ONDANSETRON HCL 4 MG/2ML IJ SOLN
INTRAMUSCULAR | Status: DC | PRN
  Administered 2022-09-30: 4 mg via INTRAVENOUS

## 2022-09-30 MED ORDER — DOBUTAMINE INFUSION FOR EP/ECHO/NUC (1000 MCG/ML)
INTRAVENOUS | Status: AC
  Filled 2022-09-30: qty 250

## 2022-09-30 MED ORDER — FENTANYL CITRATE (PF) 250 MCG/5ML IJ SOLN
INTRAMUSCULAR | Status: DC | PRN
  Administered 2022-09-30 (×2): 50 ug via INTRAVENOUS

## 2022-09-30 MED ORDER — ACETAMINOPHEN 500 MG PO TABS
1000.0000 mg | ORAL_TABLET | Freq: Once | ORAL | Status: AC
  Administered 2022-09-30: 1000 mg via ORAL
  Filled 2022-09-30: qty 2

## 2022-09-30 MED ORDER — PHENYLEPHRINE 80 MCG/ML (10ML) SYRINGE FOR IV PUSH (FOR BLOOD PRESSURE SUPPORT)
PREFILLED_SYRINGE | INTRAVENOUS | Status: DC | PRN
  Administered 2022-09-30: 80 ug via INTRAVENOUS

## 2022-09-30 MED ORDER — SODIUM CHLORIDE 0.9% FLUSH: 3.0000 mL | INTRAVENOUS | Status: DC | PRN

## 2022-09-30 MED ORDER — SODIUM CHLORIDE 0.9 % IV SOLN: 250.0000 mL | INTRAVENOUS | Status: DC | PRN

## 2022-09-30 MED ORDER — HEPARIN (PORCINE) IN NACL 1000-0.9 UT/500ML-% IV SOLN
INTRAVENOUS | Status: DC | PRN
  Administered 2022-09-30 (×4): 500 mL

## 2022-09-30 MED ORDER — SODIUM CHLORIDE 0.9% FLUSH: 3.0000 mL | Freq: Two times a day (BID) | INTRAVENOUS | Status: DC

## 2022-09-30 MED ORDER — ROCURONIUM BROMIDE 10 MG/ML (PF) SYRINGE
PREFILLED_SYRINGE | INTRAVENOUS | Status: DC | PRN
  Administered 2022-09-30: 60 mg via INTRAVENOUS
  Administered 2022-09-30: 20 mg via INTRAVENOUS

## 2022-09-30 MED ORDER — PROPOFOL 10 MG/ML IV BOLUS
INTRAVENOUS | Status: DC | PRN
  Administered 2022-09-30: 200 mg via INTRAVENOUS

## 2022-09-30 SURGICAL SUPPLY — 19 items
BAG SNAP BAND KOVER 36X36 (MISCELLANEOUS) IMPLANT
CATH ABLAT QDOT MICRO BI TC DF (CATHETERS) IMPLANT
CATH OCTARAY 2.0 F 3-3-3-3-3 (CATHETERS) IMPLANT
CATH PIGTAIL STEERABLE D1 8.7 (WIRE) IMPLANT
CATH S-M CIRCA TEMP PROBE (CATHETERS) IMPLANT
CATH SOUNDSTAR ECO 8FR (CATHETERS) IMPLANT
CATH WEB BI DIR CSDF CRV REPRO (CATHETERS) IMPLANT
CLOSURE PERCLOSE PROSTYLE (VASCULAR PRODUCTS) IMPLANT
COVER SWIFTLINK CONNECTOR (BAG) ×1 IMPLANT
PACK EP LATEX FREE (CUSTOM PROCEDURE TRAY) ×1
PACK EP LF (CUSTOM PROCEDURE TRAY) ×1 IMPLANT
PAD DEFIB RADIO PHYSIO CONN (PAD) ×1 IMPLANT
PATCH CARTO3 (PAD) IMPLANT
SHEATH CARTO VIZIGO SM CVD (SHEATH) IMPLANT
SHEATH PINNACLE 7F 10CM (SHEATH) IMPLANT
SHEATH PINNACLE 8F 10CM (SHEATH) IMPLANT
SHEATH PINNACLE 9F 10CM (SHEATH) IMPLANT
SHEATH PROBE COVER 6X72 (BAG) IMPLANT
TUBING SMART ABLATE COOLFLOW (TUBING) IMPLANT

## 2022-09-30 NOTE — Transfer of Care (Signed)
Immediate Anesthesia Transfer of Care Note  Patient: Ariana Walsh  Procedure(s) Performed: ATRIAL FIBRILLATION ABLATION  Patient Location: Cath Lab  Anesthesia Type:General  Level of Consciousness: awake, alert , and oriented  Airway & Oxygen Therapy: Patient Spontanous Breathing  Post-op Assessment: Report given to RN and Post -op Vital signs reviewed and stable  Post vital signs: Reviewed and stable  Last Vitals:  Vitals Value Taken Time  BP 119/70 09/30/22 1130  Temp    Pulse 59 09/30/22 1131  Resp 11 09/30/22 1131  SpO2 96 % 09/30/22 1131  Vitals shown include unvalidated device data.  Last Pain:  Vitals:   09/30/22 0832  TempSrc: Oral  PainSc: 0-No pain         Complications: There were no known notable events for this encounter.

## 2022-09-30 NOTE — Anesthesia Preprocedure Evaluation (Signed)
Anesthesia Evaluation  Patient identified by MRN, date of birth, ID band Patient awake    Reviewed: Allergy & Precautions, NPO status , Patient's Chart, lab work & pertinent test results  Airway Mallampati: II  TM Distance: >3 FB Neck ROM: Full    Dental  (+) Dental Advisory Given   Pulmonary asthma , sleep apnea and Continuous Positive Airway Pressure Ventilation    breath sounds clear to auscultation       Cardiovascular hypertension, Pt. on home beta blockers + dysrhythmias Atrial Fibrillation  Rhythm:Regular Rate:Normal     Neuro/Psych negative neurological ROS     GI/Hepatic negative GI ROS, Neg liver ROS,,,  Endo/Other  negative endocrine ROS    Renal/GU negative Renal ROS     Musculoskeletal  (+) Arthritis ,    Abdominal   Peds  Hematology negative hematology ROS (+)   Anesthesia Other Findings   Reproductive/Obstetrics                             Anesthesia Physical Anesthesia Plan  ASA: 2  Anesthesia Plan: General   Post-op Pain Management: Tylenol PO (pre-op)*   Induction: Intravenous  PONV Risk Score and Plan: 3 and Dexamethasone, Ondansetron, Midazolam and Treatment may vary due to age or medical condition  Airway Management Planned: Oral ETT  Additional Equipment:   Intra-op Plan:   Post-operative Plan: Extubation in OR  Informed Consent: I have reviewed the patients History and Physical, chart, labs and discussed the procedure including the risks, benefits and alternatives for the proposed anesthesia with the patient or authorized representative who has indicated his/her understanding and acceptance.     Dental advisory given  Plan Discussed with: CRNA  Anesthesia Plan Comments:        Anesthesia Quick Evaluation

## 2022-09-30 NOTE — Anesthesia Postprocedure Evaluation (Signed)
Anesthesia Post Note  Patient: Ariana Walsh  Procedure(s) Performed: ATRIAL FIBRILLATION ABLATION     Patient location during evaluation: PACU Anesthesia Type: General Level of consciousness: awake and alert Pain management: pain level controlled Vital Signs Assessment: post-procedure vital signs reviewed and stable Respiratory status: spontaneous breathing, nonlabored ventilation, respiratory function stable and patient connected to nasal cannula oxygen Cardiovascular status: blood pressure returned to baseline and stable Postop Assessment: no apparent nausea or vomiting Anesthetic complications: no  There were no known notable events for this encounter.  Last Vitals:  Vitals:   09/30/22 1401 09/30/22 1500  BP: 125/73 137/87  Pulse: 63 66  Resp: 19 14  Temp:    SpO2: 95% 96%    Last Pain:  Vitals:   09/30/22 1216  TempSrc:   PainSc: 0-No pain                 Tiajuana Amass

## 2022-09-30 NOTE — Discharge Instructions (Signed)
Post procedure care instructions No driving for 4 days. No lifting over 5 lbs for 1 week. No vigorous or sexual activity for 1 week. You may return to work/your usual activities on 10/08/22. Keep procedure site clean & dry. If you notice increased pain, swelling, bleeding or pus, call/return!  You may shower after 24 hours, but no soaking in baths/hot tubs/pools for 1 week.   You have an appointment set up with the [facility].  Multiple studies have shown that being followed by a dedicated atrial fibrillation clinic in addition to the standard care you receive from your other physicians improves health. We believe that enrollment in the atrial fibrillation clinic will allow [place] to better care for you.   The phone number to the [facility] is [phone]. The clinic is staffed Monday through Friday from 8:30am to 5pm.  Directions: The clinic is located in the [facility], [place] the hospital at the MAIN ENTRANCE "A", use [address] to the 6th floor.  Registration desk to the right of elevators on 6th floor  If you have any trouble locating the clinic, please don't hesitate to call [phone].

## 2022-09-30 NOTE — Interval H&P Note (Signed)
History and Physical Interval Note:  09/30/2022 8:57 AM  Ariana Walsh  has presented today for surgery, with the diagnosis of afib.  The various methods of treatment have been discussed with the patient and family. After consideration of risks, benefits and other options for treatment, the patient has consented to  Procedure(s): ATRIAL FIBRILLATION ABLATION (N/A) as a surgical intervention.  The patient's history has been reviewed, patient examined, no change in status, stable for surgery.  I have reviewed the patient's chart and labs.  Questions were answered to the patient's satisfaction.     Curley Hogen [employer]

## 2022-09-30 NOTE — Anesthesia Procedure Notes (Signed)
Procedure Name: Intubation Date/Time: 09/30/2022 9:48 AM  Performed by: Carolan Clines, CRNAPre-anesthesia Checklist: Patient identified, Emergency Drugs available, Suction available and Patient being monitored Patient Re-evaluated:Patient Re-evaluated prior to induction Oxygen Delivery Method: Circle System Utilized Preoxygenation: Pre-oxygenation with 100% oxygen Induction Type: IV induction Ventilation: Mask ventilation without difficulty Laryngoscope Size: Mac and 3 Grade View: Grade I Tube type: Oral Tube size: 7.0 mm Number of attempts: 1 Airway Equipment and Method: Stylet Placement Confirmation: ETT inserted through vocal cords under direct vision, positive ETCO2 and breath sounds checked- equal and bilateral Secured at: 22 cm Tube secured with: Tape Dental Injury: Teeth and Oropharynx as per pre-operative assessment

## 2022-10-01 ENCOUNTER — Encounter (HOSPITAL_COMMUNITY): Payer: Self-pay | Admitting: Cardiology

## 2022-10-02 ENCOUNTER — Telehealth: Payer: Self-pay | Admitting: Physician Assistant

## 2022-10-02 NOTE — Telephone Encounter (Signed)
Received call from patient. S/P Afib ablation 09/30/22. She did well initially, but is now having palpitations that do not feel like her Afib. She states her heart feels fuzzy and like its hiccupping. She did check with her kardia device that said NSR. The next check said undetermined rhythm, but HR in the normal range. She has a history of SVT on prior monitor. Her heart rate is not fast. She has not checked her BP yet. She is on eliquis. We discussed possibilities, including return of Afib and ectopy. Low suspicion for a life-threatening ventricular arrhythmia given she denies any other symptoms and generally feels well otherwise.   If her HR becomes tachycardic or her BP uncontrolled, she would be best served with an ER evaluation. Otherwise, can continue to monitor at home. She expressed understanding of the plan.    Ledora Bottcher, PA-C 10/02/2022, 10:34 AM [facility] [address] [place], [place] [address]

## 2022-10-14 ENCOUNTER — Other Ambulatory Visit: Payer: Self-pay | Admitting: Family Medicine

## 2022-10-28 ENCOUNTER — Encounter (HOSPITAL_COMMUNITY): Payer: Self-pay | Admitting: Physician Assistant

## 2022-10-28 ENCOUNTER — Ambulatory Visit (HOSPITAL_COMMUNITY)
Admission: RE | Admit: 2022-10-28 | Discharge: 2022-10-28 | Disposition: A | Discharge status: Home / Self Care | Payer: Managed Care, Other (non HMO) | Source: Ambulatory Visit | Attending: Physician Assistant | Admitting: Physician Assistant

## 2022-10-28 VITALS — BP 150/100 | HR 59 | Ht 66.0 in | Wt 193.0 lb

## 2022-10-28 DIAGNOSIS — I480 Paroxysmal atrial fibrillation: Secondary | ICD-10-CM | POA: Diagnosis not present

## 2022-10-28 DIAGNOSIS — Z79899 Other long term (current) drug therapy: Secondary | ICD-10-CM | POA: Diagnosis not present

## 2022-10-28 DIAGNOSIS — G4733 Obstructive sleep apnea (adult) (pediatric): Secondary | ICD-10-CM | POA: Insufficient documentation

## 2022-10-28 DIAGNOSIS — R030 Elevated blood-pressure reading, without diagnosis of hypertension: Secondary | ICD-10-CM | POA: Diagnosis not present

## 2022-10-28 DIAGNOSIS — Z5181 Encounter for therapeutic drug level monitoring: Secondary | ICD-10-CM

## 2022-10-28 DIAGNOSIS — Z7901 Long term (current) use of anticoagulants: Secondary | ICD-10-CM | POA: Diagnosis not present

## 2022-10-28 NOTE — Progress Notes (Signed)
Primary Care Physician: [name], MD Primary Cardiologist: Dr Cristal Deer Primary Electrophysiologist: Dr Taylor/Dr Elberta Fortis  Referring Physician: Ulyses Southward triage    Ariana Walsh is a 52 y.o. female with a history of OSA and atrial fibrillation who presents for follow up in the [facility].  The patient was initially diagnosed with atrial fibrillation 11/29/19 after presenting to the ED with palpitations, chest discomfort, shortness of breath.  EKG revealed atrial fibrillation.  CBC, BMP, D-dimer, TSH, CXR unremarkable.  She was started on diltiazem drip.  Echo with normal LVEF, no significant valvular abnormalities.  She was started on Eliquis.  Seen in consult by Dr. Ladona Ridgel and give single dose of flecainide which converted her to NSR. Patient has a CHADS2VASC score of 1.   Patient had several episodes of afib since her initial diagnosis. When in afib, she has symptoms of heart racing, fluttering, and feeling anxious. She is compliant with her CPAP and denies any significant alcohol use. Patient was started on flecainide as a bridge to ablation. She underwent afib ablation with Dr Elberta Fortis on 09/30/22.  On follow up today, patient reports that overall she has done well since the ablation. She has noted some brief palpitations at night when she lays on her left side, does not feel like her previous afib. She denies chest pain, swallowing pain, or groin issues.   Today, she denies symptoms of chest pain, shortness of breath, orthopnea, PND, lower extremity edema, dizziness, presyncope, syncope, bleeding, or neurologic sequela. The patient is tolerating medications without difficulties and is otherwise without complaint today.    Atrial Fibrillation Risk Factors:  she does have symptoms or diagnosis of sleep apnea. she is compliant with CPAP therapy. she does not have a history of rheumatic fever. she does not have a history of alcohol use.   she  has a BMI of Body mass index is 31.15 kg/m.Marland Kitchen Filed Weights   10/28/22 1534  Weight: 87.5 kg   Family History  Problem Relation Age of Onset   Breast cancer Mother        post-menopausal, DCIS   Endometrial cancer Mother        mid-late 17's   Hypertension Mother    Hypertension Father    Diabetes Father    Heart disease Father        s/p 3V CABG age 39   Mental illness Father        depression   Arthritis Father        bilateral knee replacements   Cancer Father 52       unknown primary, metastatic   Breast cancer Cousin    Mental illness Brother        depression   Healthy Son    Colon cancer Neg Hx      Atrial Fibrillation Management history:  Previous antiarrhythmic drugs: flecainide Previous cardioversions: none Previous ablations: 09/30/22 CHADS2VASC score: 1 Anticoagulation history: Eliquis   Past Medical History:  Diagnosis Date   DJD (degenerative joint disease), lumbar 11/22/2019   Xray 11/2019   Exercise-induced asthma 09/21/2018   Allergy induced also   Hernia, umbilical    Osteoarthritis, hip, bilateral 11/22/2019   xrays 11/2019   Vitamin B12 deficiency 11/20/2019   Past Surgical History:  Procedure Laterality Date   ATRIAL FIBRILLATION ABLATION N/A 09/30/2022   Procedure: ATRIAL FIBRILLATION ABLATION;  Surgeon: Regan Lemming, MD;  Location: [facility] INVASIVE CV LAB;  Service: Cardiovascular;  Laterality: N/A;  DENTAL SURGERY     HERNIA REPAIR     IVF     egg collection    Current Outpatient Medications  Medication Sig Dispense Refill   apixaban (ELIQUIS) 5 MG TABS tablet Take 1 tablet (5 mg total) by mouth 2 (two) times daily. 60 tablet 5   Cholecalciferol (VITAMIN D) 50 MCG (2000 UT) CAPS Take 2,000 Units by mouth at bedtime.     Cyanocobalamin (B-12) 1000 MCG SUBL Place 1 spray under the tongue daily.     flecainide (TAMBOCOR) 50 MG tablet Take 1 tablet (50 mg total) by mouth 2 (two) times daily. 60 tablet 3   metoprolol succinate (TOPROL  XL) 25 MG 24 hr tablet Take 1 tablet (25 mg total) by mouth daily. Take with or immediately following a meal. 90 tablet 3   montelukast (SINGULAIR) 10 MG tablet TAKE ONE TABLET BY MOUTH AT BEDTIME 30 tablet 3   No current facility-administered medications for this encounter.    Allergies  Allergen Reactions   Latex Itching    Social History   Socioeconomic History   Marital status: Single    Spouse name: Martina Sinner   Number of children: 1   Years of education: MFA in Directing   Highest education level: Not on file  Occupational History   Occupation: Adjunct professor     Comment: Sports coach: [employer]  Tobacco Use   Smoking status: Never   Smokeless tobacco: Never   Tobacco comments:    Never smoke 03/09/22  Vaping Use   Vaping Use: Never used  Substance and Sexual Activity   Alcohol use: Yes    Alcohol/week: 0.0 - 3.0 standard drinks of alcohol   Drug use: No   Sexual activity: Yes    Partners: Male    Birth control/protection: Surgical    Comment: Partner has vasectomy   Other Topics Concern   Not on file  Social History Narrative   Estranged from husband since 2011.   Lives with her son.   Her son was conceived by IVF after she and her husband split.   Breast fed x 4 years.   Social Determinants of Health   Financial Resource Strain: Not on file  Food Insecurity: Not on file  Transportation Needs: Not on file  Physical Activity: Not on file  Stress: Not on file  Social Connections: Not on file  Intimate Partner Violence: Not on file     ROS- All systems are reviewed and negative except as per the HPI above.  Physical Exam: Vitals:   10/28/22 1534  BP: (!) 150/100  Pulse: (!) 59  Weight: 87.5 kg  Height:  (1.676 m)     GEN- The patient is a well appearing female, alert and oriented x 3 today.   HEENT-head normocephalic, atraumatic, sclera clear, conjunctiva pink, hearing intact, trachea midline. Lungs- Clear  to ausculation bilaterally, normal work of breathing Heart- Regular rate and rhythm, no murmurs, rubs or gallops  GI- soft, NT, ND, + BS Extremities- no clubbing, cyanosis, or edema MS- no significant deformity or atrophy Skin- no rash or lesion Psych- euthymic mood, full affect Neuro- strength and sensation are intact   Wt Readings from Last 3 Encounters:  10/28/22 87.5 kg  09/30/22 83.5 kg  09/06/22 85.7 kg    EKG today demonstrates  SB Vent. rate 59 BPM PR interval 144 ms QRS duration 86 ms QT/QTcB 414/409 ms  Echo 11/29/21 demonstrated  1. Mitral annulus diastolic velocities suggest normal diastolic function.  Left ventricular ejection fraction, by estimation, is 60 to 65%. The left  ventricle has normal function. The left ventricle has no regional wall  motion abnormalities. Left ventricular diastolic function could not be evaluated.   2. Right ventricular systolic function is normal. The right ventricular  size is normal. There is normal pulmonary artery systolic pressure. The  estimated right ventricular systolic pressure is 22.2 mmHg.   3. The mitral valve is normal in structure. No evidence of mitral valve  regurgitation. No evidence of mitral stenosis.   4. The aortic valve is tricuspid. Aortic valve regurgitation is not  visualized. No aortic stenosis is present.   5. The inferior vena cava is normal in size with greater than 50%  respiratory variability, suggesting right atrial pressure of 3 mmHg.   Epic records are reviewed at length today  CHA2DS2-VASc Score = 1  The patient's score is based upon: CHF History: 0 HTN History: 0 Diabetes History: 0 Stroke History: 0 Vascular Disease History: 0 Age Score: 0 Gender Score: 1       ASSESSMENT AND PLAN: 1. Paroxysmal Atrial Fibrillation (ICD10:  I48.0) The patient's CHA2DS2-VASc score is 1, indicating a 0.6% annual risk of stroke.   S/p afib ablation 09/30/22 Patient appears to be maintaining SR.   Continue flecainide 50 mg BID for now. Continue Toprol 25 mg daily with Lopressor 25 mg PRN for heart racing. Continue Eliquis 5 mg BID with no missed doses for 3 months post ablation.  Kardia mobile for home monitoring.  2. Obstructive sleep apnea Encouraged compliance with CPAP therapy.   3. Elevated BP Her BP is elevated today without a history of HTN, better controlled at home. Will not make any changes today. Patient will keep check with home BP machine.    Follow up with Dr Elberta Fortis as scheduled.    Jorja Loa PA-C Afib Clinic [facility] [address] [place], [place] [address] [phone] 10/28/2022 4:04 PM

## 2022-11-04 ENCOUNTER — Other Ambulatory Visit (HOSPITAL_COMMUNITY): Payer: Self-pay | Admitting: Physician Assistant

## 2022-12-01 ENCOUNTER — Ambulatory Visit: Payer: Managed Care, Other (non HMO) | Admitting: Family Medicine

## 2022-12-01 ENCOUNTER — Telehealth: Payer: Self-pay | Admitting: Family Medicine

## 2022-12-01 ENCOUNTER — Encounter: Payer: Self-pay | Admitting: Family Medicine

## 2022-12-01 VITALS — BP 120/74 | HR 57 | Temp 98.6°F | Ht 66.0 in | Wt 191.0 lb

## 2022-12-01 DIAGNOSIS — H60332 Swimmer's ear, left ear: Secondary | ICD-10-CM

## 2022-12-01 MED ORDER — CIPROFLOXACIN-DEXAMETHASONE 0.3-0.1 % OT SUSP: 4.0000 [drp] | Freq: Two times a day (BID) | OTIC | 0 refills | Status: DC

## 2022-12-01 NOTE — Telephone Encounter (Signed)
Pt would like a call back with clarifications on the ear drops that were Rxed, she was told 3 times a day but the RX states 2 times a day. Please advise.

## 2022-12-01 NOTE — Patient Instructions (Signed)
Please follow up as scheduled for your next visit with me: 02/28/2023   If you have any questions or concerns, please don't hesitate to send me a message via MyChart or call the office at [phone]. Thank you for visiting with Ariana Walsh today! It's our pleasure caring for you.   Otitis Externa  Otitis externa is an infection of the outer ear canal. The outer ear canal is the area between the outside of the ear and the eardrum. Otitis externa is sometimes called swimmer's ear. What are the causes? Common causes of this condition include: Swimming in dirty water. Moisture in the ear. An injury to the inside of the ear. An object stuck in the ear. A cut or scrape on the outside of the ear or in the ear canal. What increases the risk? You are more likely to develop this condition if you go swimming often. What are the signs or symptoms? The first symptom of this condition is often itching in the ear. Later symptoms of the condition include: Swelling of the ear. Redness in the ear. Ear pain. The pain may get worse when you pull on your ear. Pus coming from the ear. How is this diagnosed? This condition may be diagnosed by examining the ear and testing fluid from the ear for bacteria and funguses. How is this treated? This condition may be treated with: Antibiotic ear drops. These are often given for 10-14 days. Medicines to reduce itching and swelling. Follow these instructions at home: If you were prescribed antibiotic ear drops, use them as told by your health care provider. Do not stop using the antibiotic even if you start to feel better. Take over-the-counter and prescription medicines only as told by your health care provider. Avoid getting water in your ears as told by your health care provider. This may include avoiding swimming or water sports for a few days. Keep all follow-up visits. This is important. How is this prevented? Keep your ears dry. Use the corner of a towel to dry your  ears after you swim or bathe. Avoid scratching or putting things in your ear. Doing these things can damage the ear canal or remove the protective wax that lines it, which makes it easier for bacteria and funguses to grow. Avoid swimming in lakes, polluted water, or swimming pools that may not have enough chlorine. Contact a health care provider if: You have a fever. Your ear is still red, swollen, painful, or draining pus after 3 days. Your redness, swelling, or pain gets worse. You have a severe headache. Get help right away if: You have redness, swelling, and pain or tenderness in the area behind your ear. Summary Otitis externa is an infection of the outer ear canal. Common causes include swimming in dirty water, moisture in the ear, or a cut or scrape in the ear. Symptoms include pain, redness, and swelling of the ear canal. If you were prescribed antibiotic ear drops, use them as told by your health care provider. Do not stop using the antibiotic even if you start to feel better. This information is not intended to replace advice given to you by your health care provider. Make sure you discuss any questions you have with your health care provider. Document Revised: 09/10/2020 Document Reviewed: 09/10/2020 Elsevier Patient Education  2023 [employer].

## 2022-12-01 NOTE — Telephone Encounter (Signed)
Message sent thru MyChart for instructions on ear medication

## 2022-12-01 NOTE — Progress Notes (Signed)
Subjective  CC:  Chief Complaint  Patient presents with   Ear Pain    Pt stated that she has had some Lt ear pain for the pass 3 days and it has gotten worse    Jaw Pain    Pt has been having trouble bitting down    HPI: Ariana Walsh is a 52 y.o. female who presents to the office today to address the problems listed above in the chief complaint. 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  Started 2 days ago.  Feels like there is a boil in her ear.  Tender, painful, using Tylenol with fair relief.  No drainage.  No URI symptoms.  No fevers.  No trauma.  Pain radiates to jaw.  Assessment  1. Acute swimmer's ear of left side      Plan  Otitis externa: Education given.  Ciprodex otic solution twice daily for 7 days, also use over-the-counter antibacterial ointment to pinna.  Tylenol.  Follow up: As scheduled 02/28/2023  No orders of the defined types were placed in this encounter.  Meds ordered this encounter  Medications   ciprofloxacin-dexamethasone (CIPRODEX) OTIC suspension    Sig: Place 4 drops into the left ear 2 (two) times daily.    Dispense:  7.5 mL    Refill:  0      I reviewed the patients updated PMH, FH, and SocHx.    Patient Active Problem List   Diagnosis Date Noted   Paroxysmal atrial fibrillation with rapid ventricular response (HCC) 11/29/2021    Priority: High   Family history of coronary artery disease 08/13/2021    Priority: High   OSA on CPAP 10/04/2019    Priority: High   Fatty infiltration of liver 11/11/2021    Priority: Medium    Osteoarthritis of spine with radiculopathy, cervical region 08/28/2021    Priority: Medium    Osteoarthritis of right acromioclavicular joint 08/28/2021    Priority: Medium    Rotator cuff arthropathy of right shoulder 08/28/2021    Priority: Medium    Neuroforaminal stenosis of cervical spine 08/28/2021    Priority: Medium    Mild intermittent asthma 06/02/2020    Priority:  Medium    DJD (degenerative joint disease), lumbar 11/22/2019    Priority: Medium    Osteoarthritis, hip, bilateral 11/22/2019    Priority: Medium    Exercise-induced asthma 09/21/2018    Priority: Medium    Umbilical hernia 12/30/2016    Priority: Medium    Vitamin B12 deficiency 11/20/2019    Priority: Low   Current Meds  Medication Sig   apixaban (ELIQUIS) 5 MG TABS tablet Take 1 tablet (5 mg total) by mouth 2 (two) times daily.   Cholecalciferol (VITAMIN D) 50 MCG (2000 UT) CAPS Take 2,000 Units by mouth at bedtime.   ciprofloxacin-dexamethasone (CIPRODEX) OTIC suspension Place 4 drops into the left ear 2 (two) times daily.   Cyanocobalamin (B-12) 1000 MCG SUBL Place 1 spray under the tongue daily.   flecainide (TAMBOCOR) 50 MG tablet Take 1 tablet (50 mg total) by mouth 2 (two) times daily.   metoprolol succinate (TOPROL XL) 25 MG 24 hr tablet Take 1 tablet (25 mg total) by mouth daily. Take with or immediately following a meal.   montelukast (SINGULAIR) 10 MG tablet TAKE ONE TABLET BY MOUTH AT BEDTIME    Allergies: Patient is allergic to latex. Family History: Patient family history includes Arthritis in her father; Breast cancer in  her cousin and mother; Cancer (age of onset: 26) in her father; Diabetes in her father; Endometrial cancer in her mother; Healthy in her son; Heart disease in her father; Hypertension in her father and mother; Mental illness in her brother and father. Social History:  Patient  reports that she has never smoked. She has never used smokeless tobacco. She reports current alcohol use. She reports that she does not use drugs.  Review of Systems: Constitutional: Negative for fever malaise or anorexia Cardiovascular: negative for chest pain Respiratory: negative for SOB or persistent cough Gastrointestinal: negative for abdominal pain  Objective  Vitals: BP 120/74   Pulse (!) 57   Temp 98.6 F (37 C)   Ht 5\' 6"  (1.676 m)   Wt 191 lb (86.6 kg)    LMP 01/25/2019   SpO2 96%   BMI 30.83 kg/m  General: no acute distress , A&Ox3 Left external auditory canal is red and edematous and tender, small pin like swollen area at concha upon entrance to external auditory canal.  Tender.  No fluctuance, normal TM  Commons side effects, risks, benefits, and alternatives for medications and treatment plan prescribed today were discussed, and the patient expressed understanding of the given instructions. Patient is instructed to call or message via MyChart if he/she has any questions or concerns regarding our treatment plan. No barriers to understanding were identified. We discussed Red Flag symptoms and signs in detail. Patient expressed understanding regarding what to do in case of urgent or emergency type symptoms.  Medication list was reconciled, printed and provided to the patient in AVS. Patient instructions and summary information was reviewed with the patient as documented in the AVS. This note was prepared with assistance of Dragon voice recognition software. Occasional wrong-word or sound-a-like substitutions may have occurred due to the inherent limitations of voice recognition software

## 2022-12-03 ENCOUNTER — Encounter: Payer: Self-pay | Admitting: Family Medicine

## 2022-12-03 MED ORDER — CEPHALEXIN 500 MG PO CAPS: 500.0000 mg | ORAL_CAPSULE | Freq: Two times a day (BID) | ORAL | 0 refills | Status: DC

## 2022-12-11 NOTE — Progress Notes (Signed)
HPI F never smoker followed for OSA, complicated by  Asthma triggered by colds and exercise. Mild Seasonal Rhinitis. HST 11/14/19- AHI 17.3/ hr, desaturation to 80%, body weight 179 lbs  ------------------------------------------------------------------------------------------   12/15/21- 51 yoF never smoker followed for OSA, complicated by  Asthma triggered by colds and exercise. Mild Seasonal Rhinitis, AFib,  CPAP auto 5-15/  Adapt Download- compliance 93%, AHI 1.3/ hr Body weight today-176 lbs Covid vax-4 [employer] Developed paroxysmal atrial fibrillation after COVID.  Working with cardiology.  Now on Eliquis and metoprolol. Very compliant with CPAP.  Download reviewed.  Has a battery so she can go camping.  We discussed potential association between untreated OSA and A-fib. Asthma has been comfortable and well controlled. CXR 1V 11/28/21--  IMPRESSION: No active disease.  12/14/22-  84 yoF never smoker followed for OSA, complicated by  Asthma triggered by colds and exercise. Mild Seasonal Rhinitis, AFib, Old Granulomatous Disease,  CPAP auto 5-15/  Adapt Download- compliance 97%, AHI 1.8/ hr Body weight today-191 lbs Download reviewed.  Doing fine with CPAP.  Goes camping.  Had A-fib ablation done-so far successful. Cardiac CT showed old granulomatous disease which we discussed.  Not aware of any history of histoplasmosis exposure or TB but we will do a QuantiFERON test.  ROS-see HPI   + = positive Constitutional:    weight loss, night sweats, fevers, chills, fatigue, lassitude. HEENT:    headaches, difficulty swallowing, tooth/dental problems, sore throat,       sneezing, itching, ear ache, nasal congestion, post nasal drip, snoring CV:    chest pain, orthopnea, PND, swelling in lower extremities, anasarca,                                   dizziness, palpitations Resp:   shortness of breath with exertion or at rest.                productive cough,   non-productive cough, coughing up of  blood.              change in color of mucus.  wheezing.   Skin:    rash or lesions. GI:  No-   heartburn, indigestion, abdominal pain, nausea, vomiting, diarrhea,                 change in bowel habits, loss of appetite GU: dysuria, change in color of urine, no urgency or frequency.   flank pain. MS:   joint pain, stiffness, decreased range of motion, back pain. Neuro-     nothing unusual Psych:  change in mood or affect.  depression or anxiety.   memory loss.  OBJ- Physical Exam General- Alert, Oriented, Affect-appropriate, Distress- none acute, not obese Skin- rash-none, lesions- none, excoriation- none Lymphadenopathy- none Head- atraumatic            Eyes- Gross vision intact, PERRLA, conjunctivae and secretions clear            Ears- Hearing, canals-normal            Nose-  +Clear mucus, no-Septal dev, mucus, polyps, erosion, perforation             Throat- Mallampati II-III , mucosa clear , drainage- none, tonsils- atrophic Neck- flexible , trachea midline, no stridor , thyroid nl, carotid no bruit Chest - symmetrical excursion , unlabored           Heart/CV- RRR , no murmur ,  no gallop  , no rub, nl s1 s2                           - JVD- none , edema- none, stasis changes- none, varices- none           Lung- clear to P&A, wheeze- none, cough- none , dullness-none, rub- none           Chest wall-  Abd-  Br/ Gen/ Rectal- Not done, not indicated Extrem- cyanosis- none, clubbing, none, atrophy- none, strength- nl Neuro- grossly intact to observation

## 2022-12-14 ENCOUNTER — Ambulatory Visit: Payer: Managed Care, Other (non HMO) | Admitting: Internal Medicine

## 2022-12-14 ENCOUNTER — Encounter: Payer: Self-pay | Admitting: Internal Medicine

## 2022-12-14 VITALS — BP 136/82 | HR 57 | Ht 65.0 in | Wt 191.2 lb

## 2022-12-14 DIAGNOSIS — G4733 Obstructive sleep apnea (adult) (pediatric): Secondary | ICD-10-CM | POA: Diagnosis not present

## 2022-12-14 DIAGNOSIS — D71 Functional disorders of polymorphonuclear neutrophils: Secondary | ICD-10-CM | POA: Diagnosis not present

## 2022-12-14 DIAGNOSIS — R911 Solitary pulmonary nodule: Secondary | ICD-10-CM | POA: Diagnosis not present

## 2022-12-14 NOTE — Patient Instructions (Signed)
We can continue CPAP auto 5-15  Order- lab- Quantiferon TB Gold assay     dx lung nodules

## 2022-12-15 ENCOUNTER — Ambulatory Visit (HOSPITAL_BASED_OUTPATIENT_CLINIC_OR_DEPARTMENT_OTHER): Payer: Managed Care, Other (non HMO) | Admitting: Cardiology

## 2022-12-15 ENCOUNTER — Encounter (HOSPITAL_BASED_OUTPATIENT_CLINIC_OR_DEPARTMENT_OTHER): Payer: Self-pay | Admitting: Cardiology

## 2022-12-15 VITALS — BP 122/72 | HR 55 | Ht 65.0 in | Wt 190.2 lb

## 2022-12-15 DIAGNOSIS — Z8616 Personal history of COVID-19: Secondary | ICD-10-CM

## 2022-12-15 DIAGNOSIS — Z7189 Other specified counseling: Secondary | ICD-10-CM | POA: Diagnosis not present

## 2022-12-15 DIAGNOSIS — Z8249 Family history of ischemic heart disease and other diseases of the circulatory system: Secondary | ICD-10-CM | POA: Diagnosis not present

## 2022-12-15 DIAGNOSIS — Z6831 Body mass index (BMI) 31.0-31.9, adult: Secondary | ICD-10-CM

## 2022-12-15 DIAGNOSIS — E6609 Other obesity due to excess calories: Secondary | ICD-10-CM | POA: Insufficient documentation

## 2022-12-15 DIAGNOSIS — I480 Paroxysmal atrial fibrillation: Secondary | ICD-10-CM

## 2022-12-15 NOTE — Patient Instructions (Addendum)
Medication Instructions:  Your physician recommends that you continue on your current medications as directed. Please refer to the Current Medication list given to you today.  *If you need a refill on your cardiac medications before your next appointment, please call your pharmacy*  Lab Work: NONE  Testing/Procedures: NONE  Follow-Up: At [facility], you and your health needs are our priority.  As part of our continuing mission to provide you with exceptional heart care, we have created designated Provider Care Teams.  These Care Teams include your primary Cardiologist (physician) and Advanced Practice Providers (APPs -  Physician Assistants and Nurse Practitioners) who all work together to provide you with the care you need, when you need it.  We recommend signing up for the patient portal called "MyChart".  Sign up information is provided on this After Visit Summary.  MyChart is used to connect with patients for Virtual Visits (Telemedicine).  Patients are able to view lab/test results, encounter notes, upcoming appointments, etc.  Non-urgent messages can be sent to your provider as well.   To learn more about what you can do with MyChart, go to [email or web address].    Your next appointment:   12 month(s)  The format for your next appointment:   In Person  Provider:   Bridgette Christopher, MD     

## 2022-12-15 NOTE — Progress Notes (Signed)
Cardiology Office Note:    Date:  12/15/2022   ID:  [name], DOB [birth year], MRN [id number]  PCP:  [name], MD  Cardiologist:  [name], MD  Referring MD: [name], MD   CC: follow up  History of Present Illness:    Ariana Walsh is a 52 y.o. female with a hx of paroxysmal atrial fibrillation, asthma who is seen for follow up today. I initially met her 05/25/21 as a new consult at the request of [name], MD for the evaluation and management of palpitations and pre-op clearance for hernia repair.  Family History: Father had CABG x4 in his 22s. "Everyone in her family" has CAD, heart attack, or stroke. CT from 2022 [facility], record scanned in) noted diffuse fatty liver infiltration.  She underwent atrial fibrillation ablation 09/30/2022 with Dr. Elberta Fortis. At her follow-up 4/18 she was doing well with only brief nocturnal palpitations while lying on her left side; they didn't feel like her previous Afib. Continued on her antiarrhythmic medications. Her blood pressure was elevated to 150/100 in the office, but was better controlled at home.  Today, she notes having palpitations the first few nights after her ablation. Lately she has to be very still to be able to notice any palpitations or abnormalities in her heart rhythm. Prior orthostatic symptoms have also been very rare. Her main concern today is how a future Covid-19 infection could affect her heart rhythm. She has EP follow up scheduled for 6/21.  Yesterday she had ordered a decaf macchiato but had been given caffeinated by mistake. She notes that although this helped her realize she is truly sensitive to caffeine, she did not experience any significant palpitations at the time.   In light of her recent health issues and since her Covid infection, she states that she has regained the weight that she had lost (previously down to 160 lbs). She continues to work on weight loss. Also  she believes she is now in full menopause.  She denies any chest pain, shortness of breath, peripheral edema, headaches, syncope, orthopnea, or PND.   Past Medical History:  Diagnosis Date   DJD (degenerative joint disease), lumbar 11/22/2019   Xray 11/2019   Exercise-induced asthma 09/21/2018   Allergy induced also   Hernia, umbilical    Osteoarthritis, hip, bilateral 11/22/2019   xrays 11/2019   Vitamin B12 deficiency 11/20/2019    Past Surgical History:  Procedure Laterality Date   ATRIAL FIBRILLATION ABLATION N/A 09/30/2022   Procedure: ATRIAL FIBRILLATION ABLATION;  Surgeon: Regan Lemming, MD;  Location: [facility];  Service: Cardiovascular;  Laterality: N/A;   DENTAL SURGERY     HERNIA REPAIR     IVF     egg collection    Current Medications: Current Outpatient Medications on File Prior to Visit  Medication Sig   apixaban (ELIQUIS) 5 MG TABS tablet Take 1 tablet (5 mg total) by mouth 2 (two) times daily.   Cholecalciferol (VITAMIN D) 50 MCG (2000 UT) CAPS Take 2,000 Units by mouth at bedtime.   Cyanocobalamin (B-12) 1000 MCG SUBL Place 1 spray under the tongue daily.   flecainide (TAMBOCOR) 50 MG tablet Take 1 tablet (50 mg total) by mouth 2 (two) times daily.   metoprolol succinate (TOPROL XL) 25 MG 24 hr tablet Take 1 tablet (25 mg total) by mouth daily. Take with or immediately following a meal.   montelukast (SINGULAIR) 10 MG tablet TAKE ONE TABLET BY MOUTH  AT BEDTIME   No current facility-administered medications on file prior to visit.     Allergies:   Latex   Social History   Tobacco Use   Smoking status: Never   Smokeless tobacco: Never   Tobacco comments:    Never smoke 03/09/22  Vaping Use   Vaping Use: Never used  Substance Use Topics   Alcohol use: Yes    Alcohol/week: 0.0 - 3.0 standard drinks of alcohol   Drug use: No    Family History: family history includes Arthritis in her father; Breast cancer in her cousin and mother; Cancer  (age of onset: 68) in her father; Diabetes in her father; Endometrial cancer in her mother; Healthy in her son; Heart disease in her father; Hypertension in her father and mother; Mental illness in her brother and father. There is no history of Colon cancer.  ROS:   Please see the history of present illness.   (+) Rare, mild palpitations Additional pertinent ROS otherwise unremarkable.  EKGs/Labs/Other Studies Reviewed:    The following studies were reviewed today:  Atrial Fibrillation Ablation  09/30/2022: CONCLUSIONS: 1. Sinus rhythm upon presentation.   2. Successful electrical isolation and anatomical encircling of all four pulmonary veins with radiofrequency current. 3. No inducible arrhythmias following ablation both on and off of dobutamine 4. No early apparent complications.  Cardiac CT  09/27/2022: IMPRESSION: 1.  Mild bi atrial enlargement with no LAA thrombus   2.  No PFO/ASD   3.  Normal ascending thoracic aorta 2.8 cm   4.  Normal PV anatomy see measurements above Some motion artifact   5.  No pericardial effusion   6.  Calcium score 0  Coronary Calcium Scoring 12/30/2021: IMPRESSION: Coronary calcium score of 0.  Echo 11/29/2021: IMPRESSIONS    1. Mitral annulus diastolic velocities suggest normal diastolic function.  Left ventricular ejection fraction, by estimation, is 60 to 65%. The left  ventricle has normal function. The left ventricle has no regional wall  motion abnormalities. Left  ventricular diastolic function could not be evaluated.   2. Right ventricular systolic function is normal. The right ventricular  size is normal. There is normal pulmonary artery systolic pressure. The  estimated right ventricular systolic pressure is 22.2 mmHg.   3. The mitral valve is normal in structure. No evidence of mitral valve  regurgitation. No evidence of mitral stenosis.   4. The aortic valve is tricuspid. Aortic valve regurgitation is not  visualized. No  aortic stenosis is present.   5. The inferior vena cava is normal in size with greater than 50%  respiratory variability, suggesting right atrial pressure of 3 mmHg.  Monitor 02/2021-03/2021: Patch Wear Time:  3 days and 0 hours    Predominant rhythm was sinus rhythm <1% ventricular and supraventricular ectopy No atrial fibrillation noted 9 SVT episodes, all less than 10 beats Symptoms/triggered events associated with sinus rhythm  EKG:  EKG is personally reviewed.   12/15/2022:  sinus bradycardia at 55 bpm 02/22/2022:sinus bradycardia at 52 bpm, Qt 438 ms 11/11/21: sinus bradycardia at 57 bpm, sinus arrhythmia (normal) 05/25/2021: sinus bradycardia at 56 bpm  Recent Labs: 09/06/2022: BUN 14; Creatinine, Ser 0.71; Hemoglobin 14.2; Platelets 288; Potassium 4.0; Sodium 142   Recent Lipid Panel    Component Value Date/Time   CHOL 156 02/23/2022 1154   TRIG 57.0 02/23/2022 1154   HDL 60.20 02/23/2022 1154   CHOLHDL 3 02/23/2022 1154   VLDL 11.4 02/23/2022 1154   LDLCALC 84 02/23/2022 1154  Physical Exam:    VS:  BP 122/72 (BP Location: Left Arm, Patient Position: Sitting, Cuff Size: Large)   Pulse (!) 55   Ht 5\' 5"  (1.651 m)   Wt 190 lb 3.2 oz (86.3 kg)   LMP 01/25/2019   BMI 31.65 kg/m     Wt Readings from Last 3 Encounters:  12/15/22 190 lb 3.2 oz (86.3 kg)  12/14/22 191 lb 3.2 oz (86.7 kg)  12/01/22 191 lb (86.6 kg)    GEN: Well nourished, well developed in no acute distress HEENT: Normal, moist mucous membranes NECK: No JVD CARDIAC: regular rhythm, normal S1 and S2, no rubs or gallops. No murmur. VASCULAR: Radial and DP pulses 2+ bilaterally. No carotid bruits RESPIRATORY:  Clear to auscultation without rales, wheezing or rhonchi  ABDOMEN: Soft, non-tender, non-distended MUSCULOSKELETAL:  Ambulates independently SKIN: Warm and dry, no edema NEUROLOGIC:  Alert and oriented x 3. No focal neuro deficits noted. PSYCHIATRIC:  Normal affect     ASSESSMENT:    1.  Paroxysmal atrial fibrillation (HCC)   2. Personal history of COVID-19   3. Family history of heart disease   4. Cardiac risk counseling   5. Class 1 obesity due to excess calories without serious comorbidity with body mass index (BMI) of 31.0 to 31.9 in adult   6. Counseling on health promotion and disease prevention     PLAN:    Paroxysmal atrial fibrillation -s/p ablation CHA2DS2/VAS Stroke Risk Points=1 , not on anticoagulation -on metoprolol succinate daily, consider stopping if symptoms completely resolve  Family history of heart disease -calcium score 12/2021 = 0  History of fatty liver on CT scan.  -we have discussed recommendations for management, risk management  Intermittently elevated blood pressure -we discussed normal triggers that can raise blood pressure short term and that long term average is the goal of management -at goal today  Covid education: we discussed vaccination timing at length today. There is no clear guidance on this, and her concern is that inflammation from the infection and/or vaccination may trigger her afib. 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  Cardiac risk counseling and prevention recommendations: -recommend heart healthy/Mediterranean diet, with whole grains, fruits, vegetable, fish, lean meats, nuts, and olive oil. Limit salt. -recommend moderate walking, 3-5 times/week for 30-50 minutes each session. Aim for at least 150 minutes.week. Goal should be pace of 3 miles/hours, or walking 1.5 miles in 30 minutes -recommend avoidance of tobacco products. Avoid excess alcohol. -ASCVD risk score: The 10-year ASCVD risk score (Arnett DK, et al., 2019) is: 1%   Values used to calculate the score:     Age: 16 years     Sex: Female     Is Non-Hispanic African American: No     Diabetic: No     Tobacco smoker: No     Systolic Blood Pressure: 122 mmHg     Is BP treated: No     HDL Cholesterol: 60.2 mg/dL      Total Cholesterol: 156 mg/dL    Plan for follow up: 1 year or sooner as needed.  [name], MD, PhD, [facility]  [facility]    I,Mathew Stumpf,acting as a scribe for [name], MD.,have documented all relevant documentation on the behalf of [name], MD,as directed by  [name], MD while in the presence of [name], MD.  I, [name], MD, have reviewed all documentation for this visit. The documentation on 12/15/22 for  the exam, diagnosis, procedures, and orders are all accurate and complete.   Signed, [name], MD PhD 12/15/2022     [facility]

## 2022-12-16 ENCOUNTER — Ambulatory Visit: Payer: Managed Care, Other (non HMO) | Admitting: Internal Medicine

## 2022-12-16 LAB — QUANTIFERON-TB GOLD PLUS
Mitogen-NIL: >10 IU/mL
NIL: 0.04 IU/mL
QuantiFERON-TB Gold Plus: NEGATIVE
TB1-NIL: <0 IU/mL
TB2-NIL: <0 IU/mL

## 2022-12-31 ENCOUNTER — Encounter: Payer: Self-pay | Admitting: Cardiology

## 2022-12-31 ENCOUNTER — Encounter: Payer: Self-pay | Admitting: Family Medicine

## 2022-12-31 ENCOUNTER — Ambulatory Visit: Payer: Managed Care, Other (non HMO) | Attending: Cardiology | Admitting: Cardiology

## 2022-12-31 ENCOUNTER — Ambulatory Visit (INDEPENDENT_AMBULATORY_CARE_PROVIDER_SITE_OTHER): Payer: Managed Care, Other (non HMO) | Admitting: Family Medicine

## 2022-12-31 VITALS — BP 138/76 | HR 54 | Temp 98.2°F | Ht 65.0 in | Wt 191.8 lb

## 2022-12-31 VITALS — BP 124/72 | HR 57 | Ht 65.0 in | Wt 193.0 lb

## 2022-12-31 DIAGNOSIS — I4819 Other persistent atrial fibrillation: Secondary | ICD-10-CM | POA: Diagnosis not present

## 2022-12-31 DIAGNOSIS — H01001 Unspecified blepharitis right upper eyelid: Secondary | ICD-10-CM

## 2022-12-31 DIAGNOSIS — H1013 Acute atopic conjunctivitis, bilateral: Secondary | ICD-10-CM

## 2022-12-31 DIAGNOSIS — G4733 Obstructive sleep apnea (adult) (pediatric): Secondary | ICD-10-CM | POA: Diagnosis not present

## 2022-12-31 DIAGNOSIS — L281 Prurigo nodularis: Secondary | ICD-10-CM | POA: Diagnosis not present

## 2022-12-31 MED ORDER — ERYTHROMYCIN 5 MG/GM OP OINT: 1.0000 | TOPICAL_OINTMENT | Freq: Three times a day (TID) | OPHTHALMIC | 0 refills | Status: DC

## 2022-12-31 MED ORDER — CETIRIZINE HCL 10 MG PO TABS: 10.0000 mg | ORAL_TABLET | Freq: Every day | ORAL | 11 refills | Status: DC

## 2022-12-31 NOTE — Progress Notes (Signed)
  Electrophysiology Office Note:   Date:  12/31/2022  ID:  Ariana Walsh [place], DOB July 03, 1971, MRN [id number]  Primary Cardiologist: [name], MD Electrophysiologist: Regan Lemming, MD      History of Present Illness:   Ariana Walsh is a 52 y.o. female with h/o atrial fibrillation seen today for routine electrophysiology followup.  Since last being seen in our clinic the patient reports doing.  She previously had a distinct redness of her heart and how it was beating.  Since ablation, this is gone away.  She said her heart is much more "relaxed".  She has more energy and is doing her daily activities without restriction.  she denies chest pain, palpitations, dyspnea, PND, orthopnea, nausea, vomiting, dizziness, syncope, edema, weight gain, or early satiety.     History of obstructive sleep apnea and atrial fibrillation.  She presented to emergency room 11/29/2019 with palpitations and was found to be in atrial fibrillation.  She had more frequent episodes of atrial fibrillation and is post ablation 09/30/2022.     Review of systems complete and found to be negative unless listed in HPI.   Studies Reviewed:    EKG is ordered today. Personal review as below.  EKG Interpretation  Date/Time:  Friday December 31 2022 15:38:55 EDT Ventricular Rate:  57 PR Interval:  140 QRS Duration: 82 QT Interval:  418 QTC Calculation: 406 R Axis:   86 Text Interpretation: Sinus bradycardia When compared with ECG of 28-Oct-2022 15:36, No significant change was found Confirmed by Devondre Guzzetta ([id number]) on 12/31/2022 3:45:26 PM    Risk Assessment/Calculations:    CHA2DS2-VASc Score = 1   This indicates a 0.6% annual risk of stroke. The patient's score is based upon: CHF History: 0 HTN History: 0 Diabetes History: 0 Stroke History: 0 Vascular Disease History: 0 Age Score: 0 Gender Score: 1              Physical Exam:   VS:  BP 124/72   Pulse (!) 57   Ht 5\' 5"   (1.651 m)   Wt 193 lb (87.5 kg)   LMP 01/25/2019   SpO2 97%   BMI 32.12 kg/m    Wt Readings from Last 3 Encounters:  12/31/22 193 lb (87.5 kg)  12/31/22 191 lb 12.8 oz (87 kg)  12/15/22 190 lb 3.2 oz (86.3 kg)     GEN: Well nourished, well developed in no acute distress NECK: No JVD; No carotid bruits CARDIAC: Regular rate and rhythm, no murmurs, rubs, gallops RESPIRATORY:  Clear to auscultation without rales, wheezing or rhonchi  ABDOMEN: Soft, non-tender, non-distended EXTREMITIES:  No edema; No deformity   ASSESSMENT AND PLAN:    1.  Paroxysmal atrial fibrillation: Currently on flecainide, Eliquis, Toprol-XL.  Status post ablation 09/30/2022.  He has had no further episodes of atrial fibrillation since ablation.  She is quite happy with her control.  Due to that, we Lean Jaeger stop both Eliquis and flecainide today.  If she remains in sinus rhythm at her next visit, could potentially stop metoprolol.  2.  Obstructive sleep apnea: CPAP compliance encouraged  Follow up with Afib Clinic in 6 months  Signed, Kiano Terrien Ariana Loa, MD

## 2022-12-31 NOTE — Patient Instructions (Signed)
Medication Instructions:  Your physician has recommended you make the following change in your medication:  STOP Eliquis STOP Flecainide  *If you need a refill on your cardiac medications before your next appointment, please call your pharmacy*   Lab Work: None ordered   Testing/Procedures: None ordered   Follow-Up: At [facility], you and your health needs are our priority.  As part of our continuing mission to provide you with exceptional heart care, we have created designated Provider Care Teams.  These Care Teams include your primary Cardiologist (physician) and Advanced Practice Providers (APPs -  Physician Assistants and Nurse Practitioners) who all work together to provide you with the care you need, when you need it.  Your next appointment:   6 month(s)  The format for your next appointment:   In Person  Provider:   You will follow up in the Atrial Fibrillation Clinic located at [facility]. Your provider will be: Clint R. [place], PA-C  0r  Landry Mellow, [place]  Thank you for choosing [facility]!!   Dory Horn, RN [phone]

## 2022-12-31 NOTE — Patient Instructions (Signed)
Please follow up as scheduled for your next visit with me: 02/28/2023   If you have any questions or concerns, please don't hesitate to send me a message via MyChart or call the office at [phone]. Thank you for visiting with [place] today! It's our pleasure caring for you.   Blepharitis Blepharitis refers to inflammation of the eyelids. It is a common condition and can cause dryness or grittiness in the eyes. Other symptoms may include: Reddish, scaly skin around the scalp and eyebrows. Burning or itching of the eyelids. Eye discharge at night that causes the eyelashes to stick together in the morning. Eyelashes that fall out. Redness of the eyes. Sensitivity to light. Follow these instructions at home: Pay attention to any changes in how your eyes look or feel. Tell your health care provider about any changes. Follow these instructions to help with your condition. Keeping clean Wash your hands often with soap and water for at least 20 seconds. Clean your eyes and wash the edges of your eyelids with diluted baby shampoo or commercial eyelid wipes. Do this 2 or more times a day. Wash your face and eyebrows at least once a day. Use a clean towel each time you dry your eyelids. Do not use this towel to clean or dry other areas of your body. Do not share your towel with anyone. General instructions Avoid wearing makeup until you get better. Do not share makeup with anyone. Avoid rubbing your eyes. Use warm compresses on the eyes for 5-10 minutes. Do this 1 or 2 times a day, or as told by your health care provider. You can use warm water on a towel, but a microwaveable heating pad often stays warm longer. The pad should be very warm but not hot enough to burn the skin. If you were prescribed an antibiotic ointment or steroid drops, apply or use the medicine as told by your health care provider. Do not stop using the medicine even if you feel better. Keep all follow-up visits. This is  important. Contact a health care provider if: Your eyelids feel hot. You have blisters or a rash on your eyelids. The inflammation gets worse or does not go away in 2-4 days. Get help right away if: You have pain or redness that gets worse or spreads to other parts of your face. Your vision changes. You have pain when looking at lights or moving objects. You have a fever. Summary Blepharitis refers to inflammation of the eyelids. It can cause dryness and grittiness in the eyes. Pay attention to any changes in how your eyes look or feel. Tell your health care provider about any changes. Follow home care instructions as told by your health care provider. Wash your hands often with soap and water for at least 20 seconds. Avoid wearing makeup. Do not rub your eyes. If you were prescribed an antibiotic ointment or steroid drops, apply or use the medicine as told by your health care provider. Get help right away if you have a fever, vision changes, pain or redness that gets worse or spreads to other parts of your face, or pain when looking at lights or moving objects. This information is not intended to replace advice given to you by your health care provider. Make sure you discuss any questions you have with your health care provider. Document Revised: 07/30/2020 Document Reviewed: 07/30/2020 Elsevier Patient Education  2024 [employer].

## 2022-12-31 NOTE — Progress Notes (Signed)
Subjective  CC:  Chief Complaint  Patient presents with   Stye    HPI: Ariana Walsh is a 52 y.o. female who presents to the office today to address the problems listed above in the chief complaint. Complains of red swollen upper eyelid.  Reviewed urgent care note from several weeks ago.  Treated for preorbital cellulitis and conjunctivitis.  Patient only used topical drops and symptoms improved.  She reports that 4 to 5 days ago she developed a red swollen upper eyelid again.  Has noted crusting on the eyelashes.  No red conjunctiva.  Some itching.  Does have allergies.  No longer on oral antihistamine.  Does take Singulair.  No visual disturbance or eye pain. Tends to pick at her skin when she is bored, has some red nodules.  Wants to be sure they are not infected.  Uses a nonbacterial soap.  No fevers.  No drainage  Assessment  1. Blepharitis of right upper eyelid, unspecified type   2. Allergic conjunctivitis of both eyes   3. Picker's nodules      Plan  Blepharitis and allergic conjunctivitis: Reassured.  Erythromycin ointment to be used as needed.  Restart Zyrtec.  Follow-up if unimproved.  She may wear contacts and swim.  She was concerned about these things Picker's nodules: Discussed skin care, start antibacterial soap for the next month.  Avoid picking if possible.  Vaseline or Neosporin if needed.  Follow up: For complete physical 02/28/2023  No orders of the defined types were placed in this encounter.  Meds ordered this encounter  Medications   erythromycin ophthalmic ointment    Sig: Place 1 Application into the right eye 3 (three) times daily.    Dispense:  3.5 g    Refill:  0   cetirizine (ZYRTEC) 10 MG tablet    Sig: Take 1 tablet (10 mg total) by mouth daily.    Dispense:  30 tablet    Refill:  11      I reviewed the patients updated PMH, FH, and SocHx.    Patient Active Problem List   Diagnosis Date Noted   Paroxysmal atrial fibrillation  (HCC) 11/29/2021    Priority: High   Family history of coronary artery disease 08/13/2021    Priority: High   OSA on CPAP 10/04/2019    Priority: High   Fatty infiltration of liver 11/11/2021    Priority: Medium    Osteoarthritis of spine with radiculopathy, cervical region 08/28/2021    Priority: Medium    Osteoarthritis of right acromioclavicular joint 08/28/2021    Priority: Medium    Rotator cuff arthropathy of right shoulder 08/28/2021    Priority: Medium    Neuroforaminal stenosis of cervical spine 08/28/2021    Priority: Medium    Mild intermittent asthma 06/02/2020    Priority: Medium    DJD (degenerative joint disease), lumbar 11/22/2019    Priority: Medium    Osteoarthritis, hip, bilateral 11/22/2019    Priority: Medium    Exercise-induced asthma 09/21/2018    Priority: Medium    Umbilical hernia 12/30/2016    Priority: Medium    Vitamin B12 deficiency 11/20/2019    Priority: Low   Picker's nodules 12/31/2022   Class 1 obesity due to excess calories without serious comorbidity with body mass index (BMI) of 31.0 to 31.9 in adult 12/15/2022   Current Meds  Medication Sig   apixaban (ELIQUIS) 5 MG TABS tablet Take 1 tablet (5 mg total) by mouth 2 (two)  times daily.   cetirizine (ZYRTEC) 10 MG tablet Take 1 tablet (10 mg total) by mouth daily.   Cholecalciferol (VITAMIN D) 50 MCG (2000 UT) CAPS Take 2,000 Units by mouth at bedtime.   Cyanocobalamin (B-12) 1000 MCG SUBL Place 1 spray under the tongue daily.   erythromycin ophthalmic ointment Place 1 Application into the right eye 3 (three) times daily.   flecainide (TAMBOCOR) 50 MG tablet Take 1 tablet (50 mg total) by mouth 2 (two) times daily.   metoprolol succinate (TOPROL XL) 25 MG 24 hr tablet Take 1 tablet (25 mg total) by mouth daily. Take with or immediately following a meal.   montelukast (SINGULAIR) 10 MG tablet TAKE ONE TABLET BY MOUTH AT BEDTIME    Allergies: Patient is allergic to latex. Family  History: Patient family history includes Arthritis in her father; Breast cancer in her cousin and mother; Cancer (age of onset: 36) in her father; Diabetes in her father; Endometrial cancer in her mother; Healthy in her son; Heart disease in her father; Hypertension in her father and mother; Mental illness in her brother and father. Social History:  Patient  reports that she has never smoked. She has never used smokeless tobacco. She reports current alcohol use. She reports that she does not use drugs.  Review of Systems: Constitutional: Negative for fever malaise or anorexia Cardiovascular: negative for chest pain Respiratory: negative for SOB or persistent cough Gastrointestinal: negative for abdominal pain  Objective  Vitals: BP 138/76   Pulse (!) 54   Temp 98.2 F (36.8 C)   Ht 5\' 5"  (1.651 m)   Wt 191 lb 12.8 oz (87 kg)   LMP 01/25/2019   SpO2 97%   BMI 31.92 kg/m  General: no acute distress , A&Ox3 HEENT: PEERL, conjunctiva normal, right upper eyelid with some erythema and swelling, crusting noted on upper eyelashes, minimal redness of conjunctive bilaterally neck is supple Skin:  Warm, no rashes, bilateral posterior upper shoulders/skin with picker's nodules present.  No drainage  Commons side effects, risks, benefits, and alternatives for medications and treatment plan prescribed today were discussed, and the patient expressed understanding of the given instructions. Patient is instructed to call or message via MyChart if he/she has any questions or concerns regarding our treatment plan. No barriers to understanding were identified. We discussed Red Flag symptoms and signs in detail. Patient expressed understanding regarding what to do in case of urgent or emergency type symptoms.  Medication list was reconciled, printed and provided to the patient in AVS. Patient instructions and summary information was reviewed with the patient as documented in the AVS. This note was prepared  with assistance of Dragon voice recognition software. Occasional wrong-word or sound-a-like substitutions may have occurred due to the inherent limitations of voice recognition software

## 2023-01-26 ENCOUNTER — Encounter: Payer: Self-pay | Admitting: Internal Medicine

## 2023-01-26 DIAGNOSIS — D71 Functional disorders of polymorphonuclear neutrophils: Secondary | ICD-10-CM | POA: Insufficient documentation

## 2023-01-26 NOTE — Assessment & Plan Note (Signed)
 Benefits from CPAP with good compliance and control Plan- continue auto 5-15 

## 2023-01-26 NOTE — Assessment & Plan Note (Signed)
Disease.  No concern about current activity.  No history to suggest exposure source. Plan-QuantiFERON-TB gold assay

## 2023-01-29 ENCOUNTER — Other Ambulatory Visit: Payer: Self-pay | Admitting: Internal Medicine

## 2023-01-29 DIAGNOSIS — I480 Paroxysmal atrial fibrillation: Secondary | ICD-10-CM

## 2023-02-01 ENCOUNTER — Encounter (HOSPITAL_BASED_OUTPATIENT_CLINIC_OR_DEPARTMENT_OTHER): Payer: Self-pay | Admitting: Cardiology

## 2023-02-15 ENCOUNTER — Other Ambulatory Visit: Payer: Self-pay | Admitting: Family Medicine

## 2023-02-28 ENCOUNTER — Encounter: Payer: Managed Care, Other (non HMO) | Admitting: Family Medicine

## 2023-04-13 ENCOUNTER — Encounter: Payer: Self-pay | Admitting: Family Medicine

## 2023-04-13 ENCOUNTER — Telehealth: Payer: Self-pay | Admitting: Family Medicine

## 2023-04-13 ENCOUNTER — Ambulatory Visit (INDEPENDENT_AMBULATORY_CARE_PROVIDER_SITE_OTHER): Payer: Managed Care, Other (non HMO) | Admitting: Family Medicine

## 2023-04-13 VITALS — BP 138/84 | HR 68 | Temp 98.3°F | Ht 65.0 in | Wt 192.4 lb

## 2023-04-13 DIAGNOSIS — L858 Other specified epidermal thickening: Secondary | ICD-10-CM

## 2023-04-13 DIAGNOSIS — D71 Functional disorders of polymorphonuclear neutrophils: Secondary | ICD-10-CM | POA: Diagnosis not present

## 2023-04-13 DIAGNOSIS — E538 Deficiency of other specified B group vitamins: Secondary | ICD-10-CM

## 2023-04-13 DIAGNOSIS — G4733 Obstructive sleep apnea (adult) (pediatric): Secondary | ICD-10-CM

## 2023-04-13 DIAGNOSIS — I480 Paroxysmal atrial fibrillation: Secondary | ICD-10-CM

## 2023-04-13 DIAGNOSIS — L281 Prurigo nodularis: Secondary | ICD-10-CM

## 2023-04-13 DIAGNOSIS — Z0001 Encounter for general adult medical examination with abnormal findings: Secondary | ICD-10-CM

## 2023-04-13 DIAGNOSIS — Z13220 Encounter for screening for lipoid disorders: Secondary | ICD-10-CM | POA: Diagnosis not present

## 2023-04-13 DIAGNOSIS — K760 Fatty (change of) liver, not elsewhere classified: Secondary | ICD-10-CM

## 2023-04-13 DIAGNOSIS — D719 Functional disorders of polymorphonuclear neutrophils, unspecified: Secondary | ICD-10-CM

## 2023-04-13 LAB — CBC WITH DIFFERENTIAL/PLATELET
Basophils Absolute: 0 10*3/uL (ref 0.0–0.1)
Basophils Relative: 0.8 % (ref 0.0–3.0)
Eosinophils Absolute: 0.1 10*3/uL (ref 0.0–0.7)
Eosinophils Relative: 2.4 % (ref 0.0–5.0)
HCT: 39.7 % (ref 36.0–46.0)
Hemoglobin: 13.7 g/dL (ref 12.0–15.0)
Lymphocytes Relative: 33.6 % (ref 12.0–46.0)
Lymphs Abs: 1.4 10*3/uL (ref 0.7–4.0)
MCHC: 34.5 g/dL (ref 30.0–36.0)
MCV: 93.4 fL (ref 78.0–100.0)
Monocytes Absolute: 0.3 10*3/uL (ref 0.1–1.0)
Monocytes Relative: 6.9 % (ref 3.0–12.0)
Neutro Abs: 2.4 10*3/uL (ref 1.4–7.7)
Neutrophils Relative %: 56.3 % (ref 43.0–77.0)
Platelets: 261 10*3/uL (ref 150.0–400.0)
RBC: 4.25 Mil/uL (ref 3.87–5.11)
RDW: 12.3 % (ref 11.5–15.5)
WBC: 4.3 10*3/uL (ref 4.0–10.5)

## 2023-04-13 LAB — COMPREHENSIVE METABOLIC PANEL
ALT: 30 U/L (ref 0–35)
AST: 18 U/L (ref 0–37)
Albumin: 4.6 g/dL (ref 3.5–5.2)
Alkaline Phosphatase: 64 U/L (ref 39–117)
BUN: 11 mg/dL (ref 6–23)
CO2: 26 meq/L (ref 19–32)
Calcium: 9.4 mg/dL (ref 8.4–10.5)
Chloride: 106 meq/L (ref 96–112)
Creatinine, Ser: 0.66 mg/dL (ref 0.40–1.20)
GFR: 100.14 mL/min (ref >60.00)
Glucose, Bld: 107 mg/dL — ABNORMAL HIGH (ref 70–99)
Potassium: 3.8 meq/L (ref 3.5–5.1)
Sodium: 140 meq/L (ref 135–145)
Total Bilirubin: 0.6 mg/dL (ref 0.2–1.2)
Total Protein: 6.8 g/dL (ref 6.0–8.3)

## 2023-04-13 LAB — LIPID PANEL
Cholesterol: 143 mg/dL (ref 0–200)
HDL: 46.3 mg/dL (ref >39.00)
LDL Cholesterol: 80 mg/dL (ref 0–99)
NonHDL: 96.63
Total CHOL/HDL Ratio: 3
Triglycerides: 84 mg/dL (ref 0.0–149.0)
VLDL: 16.8 mg/dL (ref 0.0–40.0)

## 2023-04-13 LAB — TSH: TSH: 2.09 u[IU]/mL (ref 0.35–5.50)

## 2023-04-13 LAB — VITAMIN D 25 HYDROXY (VIT D DEFICIENCY, FRACTURES): VITD: 39.25 ng/mL (ref 30.00–100.00)

## 2023-04-13 LAB — VITAMIN B12: Vitamin B-12: 467 pg/mL (ref 211–911)

## 2023-04-13 NOTE — Progress Notes (Signed)
Subjective  Chief Complaint  Patient presents with   Annual Exam    Pt here for annual exam - fasting     HPI: Ariana Walsh is a 52 y.o. female who presents to [facility] [place] today for a Female Wellness Visit. She also has the concerns and/or needs as listed above in the chief complaint. These will be addressed in addition to the Health Maintenance Visit.   Wellness Visit: annual visit with health maintenance review and exam  Health maintenance: All screens are current.  Overall doing well.  Keeps very busy.  Would like to lose weight. Chronic disease f/u and/or acute problem visit: (deemed necessary to be done in addition to the wellness visit): PAF: Reviewed cardiology notes.  Has been stable since ablation.  Stopped Eliquis and flecainide.  Maintained on low-dose metoprolol with rare palpitation Prehypertension on metoprolol. Sleep apnea on CPAP is well-controlled. Complains of nodules on her skin, admits to picking at them.  No discharge.  No masses.  No atypical moles  Assessment  1. Encounter for well adult exam with abnormal findings   2. Paroxysmal atrial fibrillation (HCC)   3. Granulomatous disease (HCC)   4. Fatty infiltration of liver   5. OSA on CPAP   6. Vitamin B12 deficiency   7. Picker's nodules   8. Keratosis pilaris      Plan  Female Wellness Visit: Age appropriate Health Maintenance and Prevention measures were discussed with patient. Included topics are cancer screening recommendations, ways to keep healthy (see AVS) including dietary and exercise recommendations, regular eye and dental care, use of seat belts, and avoidance of moderate alcohol use and tobacco use.  Screens are current BMI: discussed patient's BMI and encouraged positive lifestyle modifications to help get to or maintain a target BMI. HM needs and immunizations were addressed and ordered. See below for orders. See HM and immunization section for updates.   Defers flu shot Routine labs and screening tests ordered including cmp, cbc and lipids where appropriate. Discussed recommendations regarding Vit D and calcium supplementation (see AVS)  Chronic disease management visit and/or acute problem visit: PAF is now controlled status post ablation.  In sinus rhythm.  Monitor Recommend monitoring home blood pressures.  Continue metoprolol for now. Continue CPAP for sleep apnea Monitor electrolytes, thyroid and liver tests along with B12 levels. Discussed skin care. Follow up: 6 months for blood pressure recheck Orders Placed This Encounter  Procedures   VITAMIN D 25 Hydroxy (Vit-D Deficiency, Fractures)   CBC with Differential/Platelet   Comprehensive metabolic panel   Lipid panel   TSH   Vitamin B12   No orders of the defined types were placed in this encounter.     Body mass index is 32.02 kg/m. Wt Readings from Last 3 Encounters:  04/13/23 192 lb 6.4 oz (87.3 kg)  12/31/22 193 lb (87.5 kg)  12/31/22 191 lb 12.8 oz (87 kg)     Patient Active Problem List   Diagnosis Date Noted Date Diagnosed   Paroxysmal atrial fibrillation (HCC) 11/29/2021     Priority: High    S/p ablation 09/2022; stopped flecainaide and eloquis 02/2023. Considering stopping bb if remains in sinus. Dr. Elberta Fortis    Family history of coronary artery disease 08/13/2021     Priority: High    Father and many many family members    OSA on CPAP 10/04/2019     Priority: High    HST 11/14/19- AHI 17.3/ hr, desaturation to  80%, body weight 179 lbs    Fatty infiltration of liver 11/11/2021     Priority: Medium    Osteoarthritis of spine with radiculopathy, cervical region 08/28/2021     Priority: Medium    Osteoarthritis of right acromioclavicular joint 08/28/2021     Priority: Medium    Rotator cuff arthropathy of right shoulder 08/28/2021     Priority: Medium    Neuroforaminal stenosis of cervical spine 08/28/2021     Priority: Medium    Mild intermittent  asthma 06/02/2020     Priority: Medium    DJD (degenerative joint disease), lumbar 11/22/2019     Priority: Medium     Xray 11/2019    Osteoarthritis, hip, bilateral 11/22/2019     Priority: Medium     xrays 11/2019    Exercise-induced asthma 09/21/2018     Priority: Medium     Allergy induced also    Umbilical hernia 12/30/2016     Priority: Medium     Dr. Rayburn Ma. Patient is waiting for a better time to have the surgery.    Vitamin B12 deficiency 11/20/2019     Priority: Low   Granulomatous disease (HCC) 01/26/2023     Cardiac CT 09/29/2022    Picker's nodules 12/31/2022    Class 1 obesity due to excess calories without serious comorbidity with body mass index (BMI) of 31.0 to 31.9 in adult 12/15/2022    Keratosis pilaris 01/26/2021    Health Maintenance  Topic Date Due   COVID-19 Vaccine (6 - 2023-24 season) 04/29/2023 (Originally 03/13/2023)   MAMMOGRAM  09/09/2023   Fecal DNA (Cologuard)  11/01/2023   Cervical Cancer Screening (HPV/Pap Cotest)  11/15/2024   DTaP/Tdap/Td (2 - Td or Tdap) 02/24/2032   INFLUENZA VACCINE  Completed   Hepatitis C Screening  Completed   HIV Screening  Completed   Zoster Vaccines- Shingrix  Completed   HPV VACCINES  Aged Out   Immunization History  Administered Date(s) Administered   Influenza-Unspecified 03/26/2020, 03/25/2021, 04/07/2023   Moderna Sars-Covid-2 Vaccination 12/11/2020   PFIZER(Purple Top)SARS-COV-2 Vaccination 09/08/2019, 09/29/2019, 04/05/2020   Pfizer Covid-19 Vaccine Bivalent Booster 23yrs & up 08/05/2021   Tdap 02/23/2022   Zoster Recombinant(Shingrix) 03/11/2022, 06/17/2022   We updated and reviewed the patient's past history in detail and it is documented below. Allergies: Patient is allergic to latex. Past Medical History Patient  has a past medical history of DJD (degenerative joint disease), lumbar (11/22/2019), Exercise-induced asthma (09/21/2018), Hernia, umbilical, Osteoarthritis, hip, bilateral (11/22/2019),  and Vitamin B12 deficiency (11/20/2019). Past Surgical History Patient  has a past surgical history that includes Dental surgery; IVF; Hernia repair; and ATRIAL FIBRILLATION ABLATION (N/A, 09/30/2022). Family History: Patient family history includes Arthritis in her father; Breast cancer in her cousin and mother; Cancer (age of onset: 21) in her father; Diabetes in her father; Endometrial cancer in her mother; Healthy in her son; Heart disease in her father; Hypertension in her father and mother; Mental illness in her brother and father. Social History:  Patient  reports that she has never smoked. She has never used smokeless tobacco. She reports current alcohol use. She reports that she does not use drugs.  Review of Systems: Constitutional: negative for fever or malaise Ophthalmic: negative for photophobia, double vision or loss of vision Cardiovascular: negative for chest pain, dyspnea on exertion, or new LE swelling Respiratory: negative for SOB or persistent cough Gastrointestinal: negative for abdominal pain, change in bowel habits or melena Genitourinary: negative for dysuria or gross hematuria,  no abnormal uterine bleeding or disharge Musculoskeletal: negative for new gait disturbance or muscular weakness Integumentary: negative for new or persistent rashes, no breast lumps Neurological: negative for TIA or stroke symptoms Psychiatric: negative for SI or delusions Allergic/Immunologic: negative for hives  Patient Care Team    Relationship Specialty Notifications Start End  [name], MD PCP - General Family Medicine  02/17/18   [name], MD PCP - Cardiology Cardiology  03/26/22   Regan Lemming, MD PCP - Electrophysiology Cardiology  05/03/22   [name], MD Consulting Physician General Surgery  12/30/16   [name], MD Consulting Physician General Surgery  03/23/21   Waymon Budge, MD Consulting Physician Pulmonary Disease  01/26/23      Objective  Vitals: BP 138/84   Pulse 68   Temp 98.3 F (36.8 C)   Ht 5\' 5"  (1.651 m)   Wt 192 lb 6.4 oz (87.3 kg)   LMP 01/25/2019   SpO2 99%   BMI 32.02 kg/m  General:  Well developed, well nourished, no acute distress  Psych:  Alert and orientedx3,normal mood and affect HEENT:  Normocephalic, atraumatic, non-icteric sclera,  supple neck without adenopathy, mass or thyromegaly Cardiovascular:  Normal S1, S2, RRR without gallop, rub or murmur Respiratory:  Good breath sounds bilaterally, CTAB with normal respiratory effort Gastrointestinal: normal bowel sounds, soft, non-tender, no noted masses. No HSM MSK: extremities without edema, joints without erythema or swelling Neurologic:    Mental status is normal.  Gross motor and sensory exams are normal.  No tremor Skin: Several papules on torso and upper back.  Keratosis pilaris upper arms  Commons side effects, risks, benefits, and alternatives for medications and treatment plan prescribed today were discussed, and the patient expressed understanding of the given instructions. Patient is instructed to call or message via MyChart if he/she has any questions or concerns regarding our treatment plan. No barriers to understanding were identified. We discussed Red Flag symptoms and signs in detail. Patient expressed understanding regarding what to do in case of urgent or emergency type symptoms.  Medication list was reconciled, printed and provided to the patient in AVS. Patient instructions and summary information was reviewed with the patient as documented in the AVS. This note was prepared with assistance of Dragon voice recognition software. Occasional wrong-word or sound-a-like substitutions may have occurred due to the inherent limitations of voice recognition software

## 2023-04-13 NOTE — Telephone Encounter (Signed)
Pt has been notified that form is ready to pick up and has been advised that form will be placed up front at check in.

## 2023-04-13 NOTE — Telephone Encounter (Signed)
Patient dropped off document  verification of todays 04/13/23 CPE appt , to be signed by provider. Patient requested to send it back via Call Patient to pick up within ASAP. Document is located in providers tray at front office.Please advise at Mobile [phone] (mobile)

## 2023-04-13 NOTE — Telephone Encounter (Signed)
Form placed in providers box 

## 2023-04-17 NOTE — Progress Notes (Signed)
Labs reviewed.  The 10-year ASCVD risk score (Arnett DK, et al., 2019) is: 1.5%   Values used to calculate the score:     Age: 52 years     Sex: Female     Is Non-Hispanic African American: No     Diabetic: No     Tobacco smoker: No     Systolic Blood Pressure: 138 mmHg     Is BP treated: No     HDL Cholesterol: 46.3 mg/dL     Total Cholesterol: 143 mg/dL  Dear Ms. Ariana Walsh, Thank you for allowing me to care for you at your recent office visit.  I wanted to let you know that I have reviewed your lab test results and am happy to report that they are normal outside of a mildly elevated fasting glucose at 107. Please keep to a low sugar diet and I will monitor this for you.  Everything else looks good!  Sincerely, Dr. Mardelle Matte

## 2023-06-07 ENCOUNTER — Other Ambulatory Visit: Payer: Self-pay | Admitting: Family Medicine

## 2023-06-30 ENCOUNTER — Encounter (HOSPITAL_COMMUNITY): Payer: Self-pay | Admitting: Physician Assistant

## 2023-06-30 ENCOUNTER — Ambulatory Visit (HOSPITAL_COMMUNITY)
Admission: RE | Admit: 2023-06-30 | Discharge: 2023-06-30 | Disposition: A | Discharge status: Home / Self Care | Payer: Managed Care, Other (non HMO) | Source: Ambulatory Visit | Attending: Physician Assistant | Admitting: Physician Assistant

## 2023-06-30 VITALS — BP 136/88 | HR 65 | Ht 65.0 in | Wt 193.0 lb

## 2023-06-30 DIAGNOSIS — G4733 Obstructive sleep apnea (adult) (pediatric): Secondary | ICD-10-CM | POA: Insufficient documentation

## 2023-06-30 DIAGNOSIS — I480 Paroxysmal atrial fibrillation: Secondary | ICD-10-CM | POA: Diagnosis present

## 2023-06-30 DIAGNOSIS — Z79899 Other long term (current) drug therapy: Secondary | ICD-10-CM | POA: Insufficient documentation

## 2023-06-30 MED ORDER — METOPROLOL SUCCINATE ER 25 MG PO TB24: 25.0000 mg | ORAL_TABLET | Freq: Every day | ORAL | 3 refills | Status: DC

## 2023-06-30 NOTE — Progress Notes (Signed)
Primary Care Physician: [name], MD Primary Cardiologist: Dr Cristal Deer Primary Electrophysiologist: Dr Taylor/Dr Elberta Fortis  Referring Physician: Ulyses Southward triage    Ariana Walsh is a 52 y.o. female with a history of OSA and atrial fibrillation who presents for follow up in the [facility].  The patient was initially diagnosed with atrial fibrillation 11/29/19 after presenting to the ED with palpitations, chest discomfort, shortness of breath.  EKG revealed atrial fibrillation.  CBC, BMP, D-dimer, TSH, CXR unremarkable.  She was started on diltiazem drip.  Echo with normal LVEF, no significant valvular abnormalities.  She was started on Eliquis.  Seen in consult by Dr. Ladona Ridgel and give single dose of flecainide which converted her to NSR. Patient has a CHADS2VASC score of 1.   Patient had several episodes of afib since her initial diagnosis. When in afib, she has symptoms of heart racing, fluttering, and feeling anxious. She is compliant with her CPAP and denies any significant alcohol use. Patient was started on flecainide as a bridge to ablation. She underwent afib ablation with Dr Elberta Fortis on 09/30/22.  On follow up today, patient reports that she has done well since her last visit. She did have one episode of afib that lasted about 10 minutes (Kardia strips personally reviewed today).   Today, she denies symptoms of chest pain, shortness of breath, orthopnea, PND, lower extremity edema, dizziness, presyncope, syncope, bleeding, or neurologic sequela. The patient is tolerating medications without difficulties and is otherwise without complaint today.    Atrial Fibrillation Risk Factors:  she does have symptoms or diagnosis of sleep apnea. she is compliant with CPAP therapy. she does not have a history of rheumatic fever. she does not have a history of alcohol use.   Atrial Fibrillation Management history:  Previous antiarrhythmic drugs:  flecainide Previous cardioversions: none Previous ablations: 09/30/22 Anticoagulation history: Eliquis   Past Medical History:  Diagnosis Date   DJD (degenerative joint disease), lumbar 11/22/2019   Xray 11/2019   Exercise-induced asthma 09/21/2018   Allergy induced also   Hernia, umbilical    Osteoarthritis, hip, bilateral 11/22/2019   xrays 11/2019   Vitamin B12 deficiency 11/20/2019    Current Outpatient Medications  Medication Sig Dispense Refill   Cyanocobalamin (B-12) 1000 MCG SUBL Place 1 spray under the tongue daily.     metoprolol succinate (TOPROL-XL) 25 MG 24 hr tablet Take 1 tablet (25 mg total) by mouth daily. Take with or immediately following a meal. 90 tablet 3   montelukast (SINGULAIR) 10 MG tablet TAKE 1 TABLET BY MOUTH AT BEDTIME 60 tablet 0   No current facility-administered medications for this encounter.    ROS- All systems are reviewed and negative except as per the HPI above.  Physical Exam: Vitals:   06/30/23 1410  BP: 136/88  Pulse: 65  Weight: 87.5 kg  Height: 5\' 5"  (1.651 m)     GEN: Well nourished, well developed in no acute distress NECK: No JVD; No carotid bruits CARDIAC: Regular rate and rhythm, no murmurs, rubs, gallops RESPIRATORY:  Clear to auscultation without rales, wheezing or rhonchi  ABDOMEN: Soft, non-tender, non-distended EXTREMITIES:  No edema; No deformity    Wt Readings from Last 3 Encounters:  06/30/23 87.5 kg  04/13/23 87.3 kg  12/31/22 87.5 kg    EKG today demonstrates  SR Vent. rate 65 BPM PR interval 126 ms QRS duration 84 ms QT/QTcB 400/416 ms   Echo 11/29/21 demonstrated   1. Mitral annulus diastolic  velocities suggest normal diastolic function.  Left ventricular ejection fraction, by estimation, is 60 to 65%. The left  ventricle has normal function. The left ventricle has no regional wall  motion abnormalities. Left ventricular diastolic function could not be evaluated.   2. Right ventricular systolic  function is normal. The right ventricular  size is normal. There is normal pulmonary artery systolic pressure. The  estimated right ventricular systolic pressure is 22.2 mmHg.   3. The mitral valve is normal in structure. No evidence of mitral valve  regurgitation. No evidence of mitral stenosis.   4. The aortic valve is tricuspid. Aortic valve regurgitation is not  visualized. No aortic stenosis is present.   5. The inferior vena cava is normal in size with greater than 50%  respiratory variability, suggesting right atrial pressure of 3 mmHg.   Epic records are reviewed at length today  CHA2DS2-VASc Score = 1  The patient's score is based upon: CHF History: 0 HTN History: 0 Diabetes History: 0 Stroke History: 0 Vascular Disease History: 0 Age Score: 0 Gender Score: 1       ASSESSMENT AND PLAN: Paroxysmal Atrial Fibrillation (ICD10:  I48.0) The patient's CHA2DS2-VASc score is 1, indicating a 0.6% annual risk of stroke.   S/p afib ablation 09/30/22, now off flecainide  Patient appears to be maintaining SR Continue Toprol 25 mg daily. Patient would like to continue this medication which I think is reasonable.  Kardia mobile for home monitoring.  OSA  Encouraged nightly CPAP    Follow up in the AF clinic in 6 months.    Jorja Loa PA-C Afib Clinic [facility] [address] [place], [place] [address] [phone] 06/30/2023 2:18 PM

## 2023-07-01 ENCOUNTER — Ambulatory Visit (HOSPITAL_COMMUNITY): Payer: Managed Care, Other (non HMO) | Admitting: Physician Assistant

## 2023-07-04 ENCOUNTER — Ambulatory Visit (HOSPITAL_COMMUNITY): Payer: Managed Care, Other (non HMO) | Admitting: Physician Assistant

## 2023-08-23 ENCOUNTER — Other Ambulatory Visit: Payer: Self-pay | Admitting: Family Medicine

## 2023-09-20 ENCOUNTER — Ambulatory Visit: Payer: Self-pay | Admitting: Family Medicine

## 2023-09-20 ENCOUNTER — Telehealth: Payer: Self-pay

## 2023-09-20 NOTE — Telephone Encounter (Signed)
 Copied from CRM [id number]. Topic: Clinical - Red Word Triage >> Sep 20, 2023  8:34 AM Adele Barthel wrote: Red Word that prompted transfer to Nurse Triage:   Symptoms started Saturday Right ear is painful, currently at 7/10 after taking Tylenol at 2 AM 9/10 prior to medication Ear canal is swollen  Difficulty hearing in ear.  Unable to bite correctly due to jaw swelling and pain   Chief Complaint: Right ear pain/swelling Symptoms: ear pain, ear swelling per patient, some decreased hearing Frequency: 3 days Pertinent Negatives: Patient denies runny nose, cough, headache, stiff neck, dizziness, vomiting, discharge Disposition: [] ED /[x] Urgent Care (no appt availability in office) / [] Appointment(In office/virtual)/ []  [facility]/ [] Home Care/ [] Refused Recommended Disposition /[] [facility]/ []  Follow-up with PCP Additional Notes: Patient called and advised that for the past 3 days she has been having pain in her right ear.  She also states that the hearing has decreased slightly and she notes some swelling in this ear.  She denies any runny nose, cough, headache, stiff neck, dizziness, vomiting, or discharge from her ear. Patient states that she believes she has ear infections in both ears around this time last year.  She is advised that the earliest appointment in her PCP office is tomorrow but she states that she is going to go to an urgent care at this time to go ahead and get it looked and treated. She is advised that these notes would still be sent to her PCP for their review. Patient verbalized understanding.    Reason for Disposition  Earache  (Exceptions: brief ear pain of < 60 minutes duration, earache occurring during air travel  Answer Assessment - Initial Assessment Questions 1. LOCATION: "Which ear is involved?"     Right 2. ONSET: "When did the ear start hurting"      3 days ago 3. SEVERITY: "How bad is the pain?"  (Scale 1-10; mild, moderate or severe)    - MILD (1-3): doesn't interfere with normal activities    - MODERATE (4-7): interferes with normal activities or awakens from sleep    - SEVERE (8-10): excruciating pain, unable to do any normal activities      7/10 with tylenol  and 9/10 without any medication 4. URI SYMPTOMS: "Do you have a runny nose or cough?"     No 5. FEVER: "Do you have a fever?" If Yes, ask: "What is your temperature, how was it measured, and when did it start?"     unknown 6. CAUSE: "Have you been swimming recently?", "How often do you use Q-TIPS?", "Have you had any recent air travel or scuba diving?"     No 7. OTHER SYMPTOMS: "Do you have any other symptoms?" (e.g., headache, stiff neck, dizziness, vomiting, runny nose, decreased hearing)     Decreased hearing 8. PREGNANCY: "Is there any chance you are pregnant?" "When was your last menstrual period?"     No  Protocols used: Davina Poke

## 2023-09-20 NOTE — Telephone Encounter (Signed)
 Copied from CRM [id number]. Topic: General - Other >> Sep 20, 2023 11:07 AM Ariana Walsh wrote: Reason for CRM: Patient called in regards to the missed call, stated she has already went to urgent care and doesn't need to be seen anymore  Noted

## 2023-10-11 ENCOUNTER — Other Ambulatory Visit: Payer: Self-pay | Admitting: Family Medicine

## 2023-10-11 DIAGNOSIS — Z1231 Encounter for screening mammogram for malignant neoplasm of breast: Secondary | ICD-10-CM

## 2023-10-12 ENCOUNTER — Ambulatory Visit: Payer: Managed Care, Other (non HMO) | Admitting: Family Medicine

## 2023-10-12 ENCOUNTER — Other Ambulatory Visit: Payer: Self-pay | Admitting: Family Medicine

## 2023-10-12 ENCOUNTER — Encounter: Payer: Self-pay | Admitting: Family Medicine

## 2023-10-12 VITALS — BP 149/88 | HR 58 | Temp 98.1°F | Ht 65.0 in | Wt 186.8 lb

## 2023-10-12 DIAGNOSIS — Z1211 Encounter for screening for malignant neoplasm of colon: Secondary | ICD-10-CM | POA: Diagnosis not present

## 2023-10-12 DIAGNOSIS — Z1212 Encounter for screening for malignant neoplasm of rectum: Secondary | ICD-10-CM

## 2023-10-12 DIAGNOSIS — H6993 Unspecified Eustachian tube disorder, bilateral: Secondary | ICD-10-CM

## 2023-10-12 DIAGNOSIS — R030 Elevated blood-pressure reading, without diagnosis of hypertension: Secondary | ICD-10-CM | POA: Insufficient documentation

## 2023-10-12 DIAGNOSIS — E669 Obesity, unspecified: Secondary | ICD-10-CM

## 2023-10-12 DIAGNOSIS — J4520 Mild intermittent asthma, uncomplicated: Secondary | ICD-10-CM

## 2023-10-12 DIAGNOSIS — R7301 Impaired fasting glucose: Secondary | ICD-10-CM | POA: Diagnosis not present

## 2023-10-12 MED ORDER — MONTELUKAST SODIUM 10 MG PO TABS: 10.0000 mg | ORAL_TABLET | Freq: Every day | ORAL | 3 refills | Status: DC

## 2023-10-12 MED ORDER — TIRZEPATIDE-WEIGHT MANAGEMENT 2.5 MG/0.5ML ~~LOC~~ SOLN: 2.5000 mg | SUBCUTANEOUS | 1 refills | Status: DC

## 2023-10-12 NOTE — Progress Notes (Signed)
 Subjective  CC:  Chief Complaint  Patient presents with   Hypertension    HPI: Ariana Walsh is a 53 y.o. female who presents to the office today to address the problems listed above in the chief complaint. Discussed the use of AI scribe software for clinical note transcription with the patient, who gave verbal consent to proceed. Discussed the use of AI scribe software for clinical note transcription with the patient, who gave verbal consent to proceed.  History of Present Illness   Ariana Walsh is a 53 year old female with hypertension and impaired fasting glucose who presents with concerns about blood pressure and weight management.  She is concerned about her blood pressure readings at home, which have been between 130-140/75-85 mmHg. She recently closed a show on Sunday, which may have contributed to her elevated readings. She has not been fasting as she was unsure if it was required for the visit.  She is worried about her blood sugar levels, noting a previous fasting glucose of 107 mg/dL, which she describes as 'a little bit high'. She has not had an A1c test done before.  She is actively trying to lose weight and has lost some, but finds it challenging, especially postmenopausal. She previously lost 40 pounds a couple of years ago but has since regained some weight. Her partner, Jesusita Oka, is on a medication for weight loss and has had success, but she is concerned about the cost and potential side effects of such treatments.  She has a history of an ear infection that was treated with ear drops. She feels that the ear is not quite right and experiences some hearing loss. No current symptoms of ear infection, but there is some hearing loss and fluid sensation in the ear.  She takes Singulair for allergies and has previously used cetirizine. She describes morning congestion and plans to restart cetirizine or use Flonase to address these symptoms.      History of  Present Illness    Assessment  1. Obesity (BMI 30-39.9)   2. Prehypertension   3. Screening for colorectal cancer   4. IFG (impaired fasting glucose)   5. ETD (Eustachian tube dysfunction), bilateral   6. Mild intermittent asthma without complication      Plan  Assessment and Plan Assessment & Plan  Assessment and Plan    Obesity Discussed GLP-1 receptor agonists for weight loss, including benefits and risks. - Send prescription for GLP-1 receptor agonist to [facility] for cost-effective access. -Zepbound 2.5 mg weekly for 1 month.  She will message me, and titrate up if tolerated. - Plan follow-up in 3 months to assess progress.  Hypertension Blood pressure slightly elevated. Discussed lifestyle changes for management. - Monitor blood pressure at home.  Continue metoprolol.  No further medication adjustment at this time.  Work on weight loss which should help. - Encourage lifestyle modifications including diet and exercise.  Impaired Fasting Glucose Fasting glucose level slightly elevated. Discussed weight loss for metabolic improvement. - Consider weight loss options to improve glucose levels.  Eustachian Tube Dysfunction Fluid in Eustachian tube likely due to allergies. - Start cetirizine or Flonase to address allergy-related fluid in the Eustachian tube.     Mild intermittent asthma: Eligible for Prevnar 20.  Unfortunately we are out.  Will update vaccination at next visit.   I spent a total of 45 minutes for this patient encounter. Time spent included preparation, face-to-face counseling with the patient and coordination of care, review  of chart and records, and documentation of the encounter.    Orders Placed This Encounter  Procedures   Cologuard   Meds ordered this encounter  Medications   tirzepatide (ZEPBOUND) 2.5 MG/0.5ML injection vial    Sig: Inject 2.5 mg into the skin once a week.    Dispense:  2 mL    Refill:  1     I reviewed the  patients updated PMH, FH, and SocHx.    Patient Active Problem List   Diagnosis Date Noted   Class 1 obesity due to excess calories without serious comorbidity with body mass index (BMI) of 31.0 to 31.9 in adult 12/15/2022    Priority: High   Paroxysmal atrial fibrillation (HCC) 11/29/2021    Priority: High   Family history of coronary artery disease 08/13/2021    Priority: High   OSA on CPAP 10/04/2019    Priority: High   Granulomatous disease (HCC) 01/26/2023    Priority: Medium    Fatty infiltration of liver 11/11/2021    Priority: Medium    Osteoarthritis of spine with radiculopathy, cervical region 08/28/2021    Priority: Medium    Osteoarthritis of right acromioclavicular joint 08/28/2021    Priority: Medium    Rotator cuff arthropathy of right shoulder 08/28/2021    Priority: Medium    Neuroforaminal stenosis of cervical spine 08/28/2021    Priority: Medium    Mild intermittent asthma 06/02/2020    Priority: Medium    DJD (degenerative joint disease), lumbar 11/22/2019    Priority: Medium    Osteoarthritis, hip, bilateral 11/22/2019    Priority: Medium    Exercise-induced asthma 09/21/2018    Priority: Medium    Umbilical hernia 12/30/2016    Priority: Medium    Picker's nodules 12/31/2022    Priority: Low   Keratosis pilaris 01/26/2021    Priority: Low   Vitamin B12 deficiency 11/20/2019    Priority: Low   IFG (impaired fasting glucose) 10/12/2023   Prehypertension 10/12/2023   Obesity (BMI 30-39.9) 01/26/2021   Current Meds  Medication Sig   Cyanocobalamin (B-12) 1000 MCG SUBL Place 1 spray under the tongue daily.   metoprolol succinate (TOPROL-XL) 25 MG 24 hr tablet Take 1 tablet (25 mg total) by mouth daily. Take with or immediately following a meal.   montelukast (SINGULAIR) 10 MG tablet TAKE 1 TABLET BY MOUTH AT BEDTIME   tirzepatide (ZEPBOUND) 2.5 MG/0.5ML injection vial Inject 2.5 mg into the skin once a week.    Allergies: Patient is allergic to  latex. Family History: Patient family history includes Arthritis in her father; Breast cancer in her cousin and mother; Cancer (age of onset: 19) in her father; Diabetes in her father; Endometrial cancer in her mother; Healthy in her son; Heart disease in her father; Hypertension in her father and mother; Mental illness in her brother and father. Social History:  Patient  reports that she has never smoked. She has never used smokeless tobacco. She reports current alcohol use. She reports that she does not use drugs.  Review of Systems: Constitutional: Negative for fever malaise or anorexia Cardiovascular: negative for chest pain Respiratory: negative for SOB or persistent cough Gastrointestinal: negative for abdominal pain  Objective  Vitals: BP (!) 149/88   Pulse (!) 58   Temp 98.1 F (36.7 C)   Ht 5\' 5"  (1.651 m)   Wt 186 lb 12.8 oz (84.7 kg)   LMP 01/25/2019   SpO2 97%   BMI 31.09 kg/m  General: no  acute distress , A&Ox3 HEENT: PEERL, conjunctiva normal, neck is supple, right TM with air-fluid level present.  Left TM clear Cardiovascular:  RRR without murmur or gallop.  Respiratory:  Good breath sounds bilaterally, CTAB with normal respiratory effort Skin:  Warm, no rashes  Commons side effects, risks, benefits, and alternatives for medications and treatment plan prescribed today were discussed, and the patient expressed understanding of the given instructions. Patient is instructed to call or message via MyChart if he/she has any questions or concerns regarding our treatment plan. No barriers to understanding were identified. We discussed Red Flag symptoms and signs in detail. Patient expressed understanding regarding what to do in case of urgent or emergency type symptoms.  Medication list was reconciled, printed and provided to the patient in AVS. Patient instructions and summary information was reviewed with the patient as documented in the AVS. This note was prepared with  assistance of Dragon voice recognition software. Occasional wrong-word or sound-a-like substitutions may have occurred due to the inherent limitations of voice recognition software

## 2023-10-12 NOTE — Telephone Encounter (Signed)
 Copied from CRM [id number]. Topic: Clinical - Prescription Issue >> Oct 12, 2023  3:44 PM Denese Killings wrote: Reason for CRM: Patient stated that her prescription for montelukast (SINGULAIR) 10 MG tablet was sent to the wrong Pharmacy, Prescription should be sent to [facility]. She said that [facility] is not covered under her insurance anymore. She wants her preferred pharmacy to [facility].

## 2023-10-12 NOTE — Patient Instructions (Signed)
 Please return in 3 months for recheck.   If you have any questions or concerns, please don't hesitate to send me a message via MyChart or call the office at [phone]. Thank you for visiting with Ariana Walsh today! It's our pleasure caring for you.   VISIT SUMMARY:  During your visit, we discussed your concerns about blood pressure, blood sugar levels, weight management, and ear discomfort. We reviewed your home blood pressure readings, your previous fasting glucose level, and your weight loss efforts. We also addressed your ear symptoms and allergy management.  YOUR PLAN:  -OBESITY: Obesity means having an excessive amount of body fat. We discussed the use of GLP-1 receptor agonists for weight loss, which can help reduce appetite and improve blood sugar control. A prescription will be sent to [facility] for a cost-effective option. We will follow up in 3 months to assess your progress.  -HYPERTENSION: Hypertension means high blood pressure. Your blood pressure readings at home have been slightly elevated. We discussed the importance of lifestyle changes, such as a healthy diet and regular exercise, to help manage your blood pressure. Please continue to monitor your blood pressure at home.  -IMPAIRED FASTING GLUCOSE: Impaired fasting glucose means having higher than normal blood sugar levels after fasting, but not high enough to be classified as diabetes. We discussed that weight loss can help improve your blood sugar levels. Consider weight loss options to help manage this condition.  -EUSTACHIAN TUBE DYSFUNCTION: Eustachian tube dysfunction means there is a problem with the tube that connects your middle ear to the back of your nose, often due to allergies. This can cause fluid buildup and hearing issues. We recommend starting cetirizine or Flonase to help reduce the fluid in your ear caused by allergies.  INSTRUCTIONS:  Please follow up in 3 months to assess your progress with weight loss and  blood pressure management. Continue to monitor your blood pressure at home and consider weight loss options to improve your blood sugar levels. Start taking cetirizine or Flonase for your ear symptoms.

## 2023-10-13 MED ORDER — MONTELUKAST SODIUM 10 MG PO TABS: 10.0000 mg | ORAL_TABLET | Freq: Every day | ORAL | 3 refills | Status: DC

## 2023-11-02 ENCOUNTER — Ambulatory Visit
Admission: RE | Admit: 2023-11-02 | Discharge: 2023-11-02 | Disposition: A | Discharge status: Home / Self Care | Source: Ambulatory Visit | Attending: Family Medicine | Admitting: Family Medicine

## 2023-11-02 DIAGNOSIS — Z1231 Encounter for screening mammogram for malignant neoplasm of breast: Secondary | ICD-10-CM

## 2023-11-14 ENCOUNTER — Ambulatory Visit (INDEPENDENT_AMBULATORY_CARE_PROVIDER_SITE_OTHER)

## 2023-11-14 ENCOUNTER — Encounter: Payer: Self-pay | Admitting: Physician Assistant

## 2023-11-14 ENCOUNTER — Ambulatory Visit: Admitting: Physician Assistant

## 2023-11-14 VITALS — BP 142/76 | HR 63 | Temp 98.0°F | Ht 65.0 in | Wt 195.4 lb

## 2023-11-14 DIAGNOSIS — R0781 Pleurodynia: Secondary | ICD-10-CM | POA: Diagnosis not present

## 2023-11-14 DIAGNOSIS — R1011 Right upper quadrant pain: Secondary | ICD-10-CM

## 2023-11-14 MED ORDER — KETOROLAC TROMETHAMINE 60 MG/2ML IM SOLN
60.0000 mg | Freq: Once | INTRAMUSCULAR | Status: AC
  Administered 2023-11-14: 60 mg via INTRAMUSCULAR

## 2023-11-14 MED ORDER — NAPROXEN 500 MG PO TABS: 500.0000 mg | ORAL_TABLET | Freq: Two times a day (BID) | ORAL | 0 refills | Status: AC

## 2023-11-14 NOTE — Progress Notes (Unsigned)
 Patient ID: Ariana Walsh, female    DOB: 02-Mar-1971, 53 y.o.   MRN: [id number]   Assessment & Plan:  There are no diagnoses linked to this encounter.    Assessment and Plan Assessment & Plan       No follow-ups on file.    Subjective:    Chief Complaint  Patient presents with   Flank Pain    Pt in office c/o right side pain near ribs; pt states feels like a stabbing pain; pt admits cold started three weeks ago and had bad cough and admits all over achy; pt states the pain in URQ near ribs is just unbearable; is having to prop and sleep in positions to help;     HPI Discussed the use of AI scribe software for clinical note transcription with the patient, who gave verbal consent to proceed.  History of Present Illness      Past Medical History:  Diagnosis Date   DJD (degenerative joint disease), lumbar 11/22/2019   Xray 11/2019   Exercise-induced asthma 09/21/2018   Allergy induced also   Hernia, umbilical    Osteoarthritis, hip, bilateral 11/22/2019   xrays 11/2019   Vitamin B12 deficiency 11/20/2019    Past Surgical History:  Procedure Laterality Date   ATRIAL FIBRILLATION ABLATION N/A 09/30/2022   Procedure: ATRIAL FIBRILLATION ABLATION;  Surgeon: Lei Pump, MD;  Location: [facility] INVASIVE CV [facility];  Service: Cardiovascular;  Laterality: N/A;   DENTAL SURGERY     HERNIA REPAIR     IVF     egg collection    Family History  Problem Relation Age of Onset   Breast cancer Mother        post-menopausal, DCIS   Endometrial cancer Mother        mid-late 105's   Hypertension Mother    Hypertension Father    Diabetes Father    Heart disease Father        s/p 3V CABG age 73   Mental illness Father        depression   Arthritis Father        bilateral knee replacements   Cancer Father 20       unknown primary, metastatic   Breast cancer Cousin    Mental illness Brother        depression   Healthy Son    Colon cancer Neg Hx     Social  History   Tobacco Use   Smoking status: Never   Smokeless tobacco: Never   Tobacco comments:    Never smoke 03/09/22  Vaping Use   Vaping status: Never Used  Substance Use Topics   Alcohol use: Yes    Alcohol/week: 0.0 - 3.0 standard drinks of alcohol   Drug use: No     Allergies  Allergen Reactions   Latex Itching    Review of Systems NEGATIVE UNLESS OTHERWISE INDICATED IN HPI      Objective:     BP (!) 142/76 (BP Location: Left Arm, Patient Position: Sitting, Cuff Size: Normal) Comment: Pt states normal reading for her  Pulse 63   Temp 98 F (36.7 C) (Temporal)   Ht 5\' 5"  (1.651 m)   Wt 195 lb 6.4 oz (88.6 kg)   LMP 01/25/2019   SpO2 98%   BMI 32.52 kg/m   Wt Readings from Last 3 Encounters:  11/14/23 195 lb 6.4 oz (88.6 kg)  10/12/23 186 lb 12.8 oz (84.7 kg)  06/30/23 193 lb (  87.5 kg)    BP Readings from Last 3 Encounters:  11/14/23 (!) 142/76  10/12/23 (!) 149/88  06/30/23 136/88     Physical Exam          Eian Vandervelden M Letizia Hook, PA-C

## 2023-11-15 NOTE — Patient Instructions (Signed)
  VISIT SUMMARY: You visited us  today due to right upper quadrant and rib pain following a severe coughing episode. X-rays confirmed nondisplaced fractures of your eighth and ninth ribs.  YOUR PLAN: NONDISPLACED RIB FRACTURES: You have nondisplaced fractures of your eighth and ninth ribs, which are causing significant pain, especially with movement. -Take naproxen 500 mg twice daily with food. -You can also take acetaminophen  up to 1000 mg three times daily for pain. -Use a small pillow for support during sneezing or coughing. -Lay on the opposite side for comfort. -Follow up with sports medicine or Dr. Jonelle Neri if your symptoms worsen or change.                      Contains text generated by Abridge.                                 Contains text generated by Abridge.

## 2023-11-16 ENCOUNTER — Encounter: Payer: Self-pay | Admitting: Family Medicine

## 2023-11-16 LAB — COLOGUARD: COLOGUARD: NEGATIVE

## 2023-11-16 NOTE — Progress Notes (Signed)
Negative cologuard. Mc note sent

## 2023-11-18 ENCOUNTER — Encounter: Payer: Self-pay | Admitting: Family Medicine

## 2023-12-06 ENCOUNTER — Ambulatory Visit: Admitting: Family Medicine

## 2023-12-06 ENCOUNTER — Encounter: Payer: Self-pay | Admitting: Family Medicine

## 2023-12-06 ENCOUNTER — Other Ambulatory Visit (HOSPITAL_COMMUNITY)
Admission: RE | Admit: 2023-12-06 | Discharge: 2023-12-06 | Disposition: A | Discharge status: Home / Self Care | Source: Ambulatory Visit | Attending: Family Medicine | Admitting: Family Medicine

## 2023-12-06 VITALS — BP 138/83 | HR 73 | Temp 98.0°F | Ht 65.0 in | Wt 188.4 lb

## 2023-12-06 DIAGNOSIS — S2249XD Multiple fractures of ribs, unspecified side, subsequent encounter for fracture with routine healing: Secondary | ICD-10-CM

## 2023-12-06 DIAGNOSIS — R030 Elevated blood-pressure reading, without diagnosis of hypertension: Secondary | ICD-10-CM | POA: Diagnosis not present

## 2023-12-06 DIAGNOSIS — Z113 Encounter for screening for infections with a predominantly sexual mode of transmission: Secondary | ICD-10-CM | POA: Insufficient documentation

## 2023-12-06 DIAGNOSIS — N950 Postmenopausal bleeding: Secondary | ICD-10-CM | POA: Insufficient documentation

## 2023-12-06 DIAGNOSIS — E66811 Obesity, class 1: Secondary | ICD-10-CM | POA: Diagnosis not present

## 2023-12-06 DIAGNOSIS — E6609 Other obesity due to excess calories: Secondary | ICD-10-CM

## 2023-12-06 DIAGNOSIS — Z6831 Body mass index (BMI) 31.0-31.9, adult: Secondary | ICD-10-CM

## 2023-12-06 NOTE — Patient Instructions (Addendum)
 Please follow up as scheduled for your next visit with me: 01/11/2024   If you have any questions or concerns, please don't hesitate to send me a message via MyChart or call the office at [phone]. Thank you for visiting with us  today! It's our pleasure caring for you.    VISIT SUMMARY: During your visit, we discussed your recent episode of postmenopausal bleeding, your rib fractures, and your ongoing treatment for obesity. We have outlined a plan to address each of these issues.  YOUR PLAN: -POSTMENOPAUSAL BLEEDING: Postmenopausal bleeding is any vaginal bleeding that occurs after menopause. It can be caused by low estrogen levels, polyps, or fibroids. We will perform a pelvic ultrasound to check for any abnormalities in the endometrial lining, polyps, or fibroids.  -RIB FRACTURES: You have confirmed fractures in your eighth and ninth ribs from coughing, and you suspect another fracture on the opposite side. We will conduct a bone density test to check for osteoporosis, which can make bones more prone to fractures.  -OBESITY: Obesity is being significantly overweight, which can affect your overall health. You are currently managing this with Zepbound and have lost seven pounds. Continue taking Zepbound at 2.5 mg for the current month, and if you tolerate it well, we will increase the dose to 5 mg next month.  INSTRUCTIONS: Please schedule a pelvic ultrasound to assess your endometrial lining, polyps, or fibroids. Additionally, schedule a bone density test to evaluate for osteoporosis. Continue taking Zepbound at 2.5 mg for this month, and we will consider increasing the dose to 5 mg next month if you tolerate it well.                      Contains text generated by Abridge.                                 Contains text generated by Abridge.

## 2023-12-06 NOTE — Progress Notes (Signed)
 Subjective  CC:  Chief Complaint  Patient presents with   Vaginal Bleeding    Vaginal bleeding for one day which was last Tuesday and it was watery with a little bit of brown on the tampon    HPI: Ariana Walsh is a 53 y.o. female who presents to the office today to address the problems listed above in the chief complaint. Discussed the use of AI scribe software for clinical note transcription with the patient, who gave verbal consent to proceed.  History of Present Illness Ariana Walsh is a 53 year old female who presents with postmenopausal bleeding.  She experienced a single episode of postmenopausal bleeding, described as 'barely brown' spotting with a sensation of dripping. Initially, she thought it was significant and used a tampon, which was uncomfortable, and noted a 'pinky red' color upon removal. She wore an overnight pad that was half to three-quarters full, but the bleeding resolved by the next day, returning to her usual panty liner use. The discharge was watery and not viscous. No current active bleeding.  She has been experiencing more frequent hot flashes recently. No risk of STDs.  She has a history of rib fractures due to coughing. Three weeks ago, she experienced significant pain, leading to an x-ray that confirmed fractures of the eighth and ninth ribs. She suspects another rib fracture on the opposite side, although this was not confirmed by imaging. She has a history of pulling intercostal muscles and possibly cracking ribs from coughing in the past.I reviewed notes.  She is currently on Zepbound, with a dose of 2.5 mg, and feels 'a little bit bloated'. She has completed the first month of treatment and has lost seven pounds. She initially started on a lower dose due to a misunderstanding with the syringe measurement.   Assessment  1. Postmenopausal bleeding   2. Closed fracture of multiple ribs with routine healing, unspecified laterality,  subsequent encounter   3. Prehypertension      Plan  Assessment and Plan Assessment & Plan Postmenopausal bleeding Single episode of postmenopausal spotting and discharge. Differential includes low estrogen, polyp, or fibroid, infection or other.  - Order pelvic ultrasound to assess endometrial lining, polyps, or fibroids. Check vaginal swab for infection. Normal exam today  Rib fractures Confirmed fractures on eighth and ninth ribs from coughing. Possible additional fracture not confirmed. Bone density test recommended. - Order bone density test to assess for osteoporosis.  Obesity Obesity managed with Zepbound. Reports bloating but lost seven pounds. Currently on 2.5 mg, plan to increase to 5 mg if tolerated. - Continue Zepbound at 2.5 mg for current month. - Increase Zepbound to 5 mg after current month if tolerated.  Prehypertension: continue to monitor; weight loss    Follow up: as scheduled.  Orders Placed This Encounter  Procedures   US  PELVIC COMPLETE WITH TRANSVAGINAL   DG Bone Density   No orders of the defined types were placed in this encounter.    I reviewed the patients updated PMH, FH, and SocHx.  Patient Active Problem List   Diagnosis Date Noted   IFG (impaired fasting glucose) 10/12/2023    Priority: High   Class 1 obesity due to excess calories without serious comorbidity with body mass index (BMI) of 31.0 to 31.9 in adult 12/15/2022    Priority: High   Paroxysmal atrial fibrillation (HCC) 11/29/2021    Priority: High   Family history of coronary artery disease 08/13/2021    Priority: High  OSA on CPAP 10/04/2019    Priority: High   Prehypertension 10/12/2023    Priority: Medium    Granulomatous disease (HCC) 01/26/2023    Priority: Medium    Fatty infiltration of liver 11/11/2021    Priority: Medium    Osteoarthritis of spine with radiculopathy, cervical region 08/28/2021    Priority: Medium    Osteoarthritis of right acromioclavicular joint  08/28/2021    Priority: Medium    Rotator cuff arthropathy of right shoulder 08/28/2021    Priority: Medium    Neuroforaminal stenosis of cervical spine 08/28/2021    Priority: Medium    Mild intermittent asthma 06/02/2020    Priority: Medium    DJD (degenerative joint disease), lumbar 11/22/2019    Priority: Medium    Osteoarthritis, hip, bilateral 11/22/2019    Priority: Medium    Exercise-induced asthma 09/21/2018    Priority: Medium    Umbilical hernia 12/30/2016    Priority: Medium    Picker's nodules 12/31/2022    Priority: Low   Keratosis pilaris 01/26/2021    Priority: Low   Vitamin B12 deficiency 11/20/2019    Priority: Low   Obesity (BMI 30-39.9) 01/26/2021   Current Meds  Medication Sig   Cyanocobalamin  (B-12) 1000 MCG SUBL Place 1 spray under the tongue daily.   metoprolol  succinate (TOPROL -XL) 25 MG 24 hr tablet Take 1 tablet (25 mg total) by mouth daily. Take with or immediately following a meal.   montelukast  (SINGULAIR ) 10 MG tablet Take 1 tablet (10 mg total) by mouth at bedtime.   tirzepatide (ZEPBOUND) 2.5 MG/0.5ML injection vial Inject 2.5 mg into the skin once a week.   Allergies: Patient is allergic to latex. Family History: Patient family history includes Arthritis in her father; Breast cancer in her cousin and mother; Cancer (age of onset: 81) in her father; Diabetes in her father; Endometrial cancer in her mother; Healthy in her son; Heart disease in her father; Hypertension in her father and mother; Mental illness in her brother and father. Social History:  Patient  reports that she has never smoked. She has never used smokeless tobacco. She reports current alcohol use. She reports that she does not use drugs.  Review of Systems: Constitutional: Negative for fever malaise or anorexia Cardiovascular: negative for chest pain Respiratory: negative for SOB or persistent cough Gastrointestinal: negative for abdominal pain  Objective  Vitals: BP 138/83    Pulse 73   Temp 98 F (36.7 C)   Ht 5\' 5"  (1.651 m)   Wt 188 lb 6.4 oz (85.5 kg)   LMP 01/25/2019   SpO2 96%   BMI 31.35 kg/m  General: no acute distress , A&Ox3 Pelvic Exam: Normal external genitalia, no vulvar or vaginal lesions present. Clear cervix w/o CMT. Bimanual exam reveals a nontender fundus w/o masses, nl size. No adnexal masses present. No inguinal adenopathy. A PAP smear was not performed.   Commons side effects, risks, benefits, and alternatives for medications and treatment plan prescribed today were discussed, and the patient expressed understanding of the given instructions. Patient is instructed to call or message via MyChart if he/she has any questions or concerns regarding our treatment plan. No barriers to understanding were identified. We discussed Red Flag symptoms and signs in detail. Patient expressed understanding regarding what to do in case of urgent or emergency type symptoms.  Medication list was reconciled, printed and provided to the patient in AVS. Patient instructions and summary information was reviewed with the patient as documented in the  AVS. This note was prepared with assistance of Dragon voice recognition software. Occasional wrong-word or sound-a-like substitutions may have occurred due to the inherent limitations of voice recognition software

## 2023-12-07 ENCOUNTER — Ambulatory Visit
Admission: RE | Admit: 2023-12-07 | Discharge: 2023-12-07 | Disposition: A | Discharge status: Home / Self Care | Source: Ambulatory Visit | Attending: Family Medicine | Admitting: Family Medicine

## 2023-12-07 ENCOUNTER — Ambulatory Visit: Payer: Self-pay | Admitting: Family Medicine

## 2023-12-07 DIAGNOSIS — N950 Postmenopausal bleeding: Secondary | ICD-10-CM

## 2023-12-07 DIAGNOSIS — M8580 Other specified disorders of bone density and structure, unspecified site: Secondary | ICD-10-CM

## 2023-12-07 LAB — CERVICOVAGINAL ANCILLARY ONLY
Bacterial Vaginitis (gardnerella): NEGATIVE
Candida Glabrata: NEGATIVE
Candida Vaginitis: NEGATIVE
Chlamydia: NEGATIVE
Comment: NEGATIVE
Comment: NEGATIVE
Comment: NEGATIVE
Comment: NEGATIVE
Comment: NEGATIVE
Comment: NORMAL
Neisseria Gonorrhea: NEGATIVE
Trichomonas: NEGATIVE

## 2023-12-07 NOTE — Progress Notes (Signed)
 See mychart note Dear Ms. Ruud, No infection is present.  Sincerely, Dr. Jonelle Neri

## 2023-12-08 ENCOUNTER — Other Ambulatory Visit: Payer: Self-pay | Admitting: Family Medicine

## 2023-12-11 NOTE — Progress Notes (Deleted)
 HPI F never smoker followed for OSA, complicated by  Asthma triggered by colds and exercise. Mild Seasonal Rhinitis. HST 11/14/19- AHI 17.3/ hr, desaturation to 80%, body weight 179 lbs  ------------------------------------------------------------------------------------------   12/14/22-  52 yoF never smoker followed for OSA, complicated by  Asthma triggered by colds and exercise. Mild Seasonal Rhinitis, AFib, Old Granulomatous Disease,  CPAP auto 5-15/  Adapt Download- compliance 97%, AHI 1.8/ hr Body weight today-191 lbs Download reviewed.  Doing fine with CPAP.  Goes camping.  Had A-fib ablation done-so far successful. Cardiac CT showed old granulomatous disease which we discussed.  Not aware of any history of histoplasmosis exposure or TB but we will do a QuantiFERON test.  12/13/23- 53 yoF never smoker followed for OSA, complicated by  Asthma triggered by colds and exercise. Mild Seasonal Rhinitis, AFib, Old Granulomatous Disease,  CPAP auto 5-15/  Adapt Download- compliance Body weight today- Started Zepbound  from Dr Karma Oz    ROS-see HPI   + = positive Constitutional:    weight loss, night sweats, fevers, chills, fatigue, lassitude. HEENT:    headaches, difficulty swallowing, tooth/dental problems, sore throat,       sneezing, itching, ear ache, nasal congestion, post nasal drip, snoring CV:    chest pain, orthopnea, PND, swelling in lower extremities, anasarca,                                   dizziness, palpitations Resp:   shortness of breath with exertion or at rest.                productive cough,   non-productive cough, coughing up of blood.              change in color of mucus.  wheezing.   Skin:    rash or lesions. GI:  No-   heartburn, indigestion, abdominal pain, nausea, vomiting, diarrhea,                 change in bowel habits, loss of appetite GU: dysuria, change in color of urine, no urgency or frequency.   flank pain. MS:   joint pain, stiffness, decreased  range of motion, back pain. Neuro-     nothing unusual Psych:  change in mood or affect.  depression or anxiety.   memory loss.  OBJ- Physical Exam General- Alert, Oriented, Affect-appropriate, Distress- none acute, not obese Skin- rash-none, lesions- none, excoriation- none Lymphadenopathy- none Head- atraumatic            Eyes- Gross vision intact, PERRLA, conjunctivae and secretions clear            Ears- Hearing, canals-normal            Nose-  +Clear mucus, no-Septal dev, mucus, polyps, erosion, perforation             Throat- Mallampati II-III , mucosa clear , drainage- none, tonsils- atrophic Neck- flexible , trachea midline, no stridor , thyroid  nl, carotid no bruit Chest - symmetrical excursion , unlabored           Heart/CV- RRR , no murmur , no gallop  , no rub, nl s1 s2                           - JVD- none , edema- none, stasis changes- none, varices- none  Lung- clear to P&A, wheeze- none, cough- none , dullness-none, rub- none           Chest wall-  Abd-  Br/ Gen/ Rectal- Not done, not indicated Extrem- cyanosis- none, clubbing, none, atrophy- none, strength- nl Neuro- grossly intact to observation

## 2023-12-13 ENCOUNTER — Other Ambulatory Visit: Payer: Self-pay

## 2023-12-13 ENCOUNTER — Ambulatory Visit: Payer: Managed Care, Other (non HMO) | Admitting: Internal Medicine

## 2023-12-13 ENCOUNTER — Encounter: Payer: Self-pay | Admitting: Internal Medicine

## 2023-12-13 ENCOUNTER — Encounter: Payer: Self-pay | Admitting: Family Medicine

## 2023-12-13 DIAGNOSIS — S2242XD Multiple fractures of ribs, left side, subsequent encounter for fracture with routine healing: Secondary | ICD-10-CM

## 2023-12-13 MED ORDER — ZEPBOUND 5 MG/0.5ML ~~LOC~~ SOAJ
5.0000 mg | SUBCUTANEOUS | 3 refills | Status: DC
Start: 2023-12-13 — End: 2023-12-19

## 2023-12-15 NOTE — Progress Notes (Signed)
 See mychart note Dear Ms. Mayall, Your ultrasound results show a small fibroid and a normal endometrial lining. This is reassuring. Nothing further is needed at this time.  Sincerely, Dr. Jonelle Neri

## 2023-12-19 ENCOUNTER — Other Ambulatory Visit: Payer: Self-pay

## 2023-12-19 MED ORDER — TIRZEPATIDE-WEIGHT MANAGEMENT 5 MG/0.5ML ~~LOC~~ SOLN: 5.0000 mg | SUBCUTANEOUS | 3 refills | Status: DC

## 2023-12-19 NOTE — Telephone Encounter (Signed)
 Rx sent to [facility]

## 2023-12-27 ENCOUNTER — Ambulatory Visit (HOSPITAL_COMMUNITY)
Admission: RE | Admit: 2023-12-27 | Discharge: 2023-12-27 | Disposition: A | Discharge status: Home / Self Care | Source: Ambulatory Visit | Attending: Physician Assistant | Admitting: Physician Assistant

## 2023-12-27 ENCOUNTER — Encounter (HOSPITAL_COMMUNITY): Payer: Self-pay | Admitting: Physician Assistant

## 2023-12-27 VITALS — BP 146/88 | HR 67 | Ht 65.0 in | Wt 189.0 lb

## 2023-12-27 DIAGNOSIS — I480 Paroxysmal atrial fibrillation: Secondary | ICD-10-CM

## 2023-12-27 NOTE — Progress Notes (Signed)
 Primary Care Physician: Luevenia Saha, MD Primary Cardiologist: Dr Veryl Gottron Primary Electrophysiologist: Dr Taylor/Dr Lawana Pray  Referring Physician: Myrtie Atkinson triage    Ariana Walsh is a 53 y.o. female with a history of OSA and atrial fibrillation who presents for follow up in the [facility].  The patient was initially diagnosed with atrial fibrillation 11/29/19 after presenting to the ED with palpitations, chest discomfort, shortness of breath.  EKG revealed atrial fibrillation.  CBC, BMP, D-dimer, TSH, CXR unremarkable.  She was started on diltiazem  drip.  Echo with normal LVEF, no significant valvular abnormalities.  She was started on Eliquis .  Seen in consult by Dr. Carolynne Citron and give single dose of flecainide  which converted her to NSR.   Patient had several episodes of afib since her initial diagnosis. When in afib, she has symptoms of heart racing, fluttering, and feeling anxious. She is compliant with her CPAP and denies any significant alcohol use. Patient was started on flecainide  as a bridge to ablation. She underwent afib ablation with Dr Lawana Pray on 09/30/22.  Patient returns for follow up for atrial fibrillation. She reports that she has done well since her last visit. She had one episode of tachypalpitations lasting 1-2 minutes, Kardia mobile strips reviewed. No bleeding issues on anticoagulation. She has just been started on Zepbound  to assist in weight loss.   Today, she  denies symptoms of palpitations, chest pain, shortness of breath, orthopnea, PND, lower extremity edema, dizziness, presyncope, syncope, bleeding, or neurologic sequela. The patient is tolerating medications without difficulties and is otherwise without complaint today.    Atrial Fibrillation Risk Factors:  she does have symptoms or diagnosis of sleep apnea. she does not have a history of rheumatic fever. she does not have a history of alcohol use.   Atrial  Fibrillation Management history:  Previous antiarrhythmic drugs: flecainide  Previous cardioversions: none Previous ablations: 09/30/22 Anticoagulation history: Eliquis    Past Medical History:  Diagnosis Date   DJD (degenerative joint disease), lumbar 11/22/2019   Xray 11/2019   Exercise-induced asthma 09/21/2018   Allergy induced also   Hernia, umbilical    Osteoarthritis, hip, bilateral 11/22/2019   xrays 11/2019   Vitamin B12 deficiency 11/20/2019    Current Outpatient Medications  Medication Sig Dispense Refill   Cyanocobalamin  (B-12) 1000 MCG SUBL Place 1 spray under the tongue daily.     metoprolol  succinate (TOPROL -XL) 25 MG 24 hr tablet Take 1 tablet (25 mg total) by mouth daily. Take with or immediately following a meal. 90 tablet 3   montelukast  (SINGULAIR ) 10 MG tablet Take 1 tablet (10 mg total) by mouth at bedtime. 90 tablet 3   tirzepatide  5 MG/0.5ML injection vial Inject 5 mg into the skin once a week. 2 mL 3   No current facility-administered medications for this encounter.    ROS- All systems are reviewed and negative except as per the HPI above.  Physical Exam: Vitals:   12/27/23 1040  BP: (!) 146/88  Pulse: 67  Weight: 85.7 kg  Height: 5' 5 (1.651 m)     GEN: Well nourished, well developed in no acute distress CARDIAC: Regular rate and rhythm, no murmurs, rubs, gallops RESPIRATORY:  Clear to auscultation without rales, wheezing or rhonchi  ABDOMEN: Soft, non-tender, non-distended EXTREMITIES:  No edema; No deformity    Wt Readings from Last 3 Encounters:  12/27/23 85.7 kg  12/06/23 85.5 kg  11/14/23 88.6 kg    EKG today demonstrates  SR Vent. rate 67  BPM PR interval 128 ms QRS duration 82 ms QT/QTcB 388/409 ms   Echo 11/29/21 demonstrated   1. Mitral annulus diastolic velocities suggest normal diastolic function.  Left ventricular ejection fraction, by estimation, is 60 to 65%. The left  ventricle has normal function. The left ventricle has  no regional wall  motion abnormalities. Left ventricular diastolic function could not be evaluated.   2. Right ventricular systolic function is normal. The right ventricular  size is normal. There is normal pulmonary artery systolic pressure. The  estimated right ventricular systolic pressure is 22.2 mmHg.   3. The mitral valve is normal in structure. No evidence of mitral valve  regurgitation. No evidence of mitral stenosis.   4. The aortic valve is tricuspid. Aortic valve regurgitation is not  visualized. No aortic stenosis is present.   5. The inferior vena cava is normal in size with greater than 50%  respiratory variability, suggesting right atrial pressure of 3 mmHg.   Epic records are reviewed at length today  CHA2DS2-VASc Score = 1  The patient's score is based upon: CHF History: 0 HTN History: 0 Diabetes History: 0 Stroke History: 0 Vascular Disease History: 0 Age Score: 0 Gender Score: 1       ASSESSMENT AND PLAN: Paroxysmal Atrial Fibrillation (ICD10:  I48.0) The patient's CHA2DS2-VASc score is 1, indicating a 0.6% annual risk of stroke.   S/p afib ablation 09/30/22, now off flecainide  Patient appears to be maintaining SR Continue Toprol  25 mg daily Kardia mobile for home monitoring.   OSA  Encouraged nightly CPAP  Elevated BP BP mildly elevated today. She is working on lifestyle modification for now.  Followed by her PCP.    Follow up in the AF clinic in one year.    Myrtha Ates PA-C [facility] [address] [place], [place] [address] [phone] 12/27/2023 10:49 AM

## 2024-01-11 ENCOUNTER — Encounter: Payer: Self-pay | Admitting: Family Medicine

## 2024-01-11 ENCOUNTER — Ambulatory Visit: Admitting: Family Medicine

## 2024-01-11 ENCOUNTER — Ambulatory Visit

## 2024-01-11 VITALS — BP 129/87 | HR 62 | Temp 98.2°F | Ht 65.0 in | Wt 188.4 lb

## 2024-01-11 DIAGNOSIS — E6609 Other obesity due to excess calories: Secondary | ICD-10-CM

## 2024-01-11 DIAGNOSIS — I480 Paroxysmal atrial fibrillation: Secondary | ICD-10-CM | POA: Diagnosis not present

## 2024-01-11 DIAGNOSIS — R7301 Impaired fasting glucose: Secondary | ICD-10-CM | POA: Diagnosis not present

## 2024-01-11 DIAGNOSIS — S2249XD Multiple fractures of ribs, unspecified side, subsequent encounter for fracture with routine healing: Secondary | ICD-10-CM

## 2024-01-11 DIAGNOSIS — E66811 Obesity, class 1: Secondary | ICD-10-CM

## 2024-01-11 DIAGNOSIS — I10 Essential (primary) hypertension: Secondary | ICD-10-CM

## 2024-01-11 DIAGNOSIS — Z6831 Body mass index (BMI) 31.0-31.9, adult: Secondary | ICD-10-CM

## 2024-01-11 LAB — POCT GLYCOSYLATED HEMOGLOBIN (HGB A1C): Hemoglobin A1C: 5.3 % (ref 4.0–5.6)

## 2024-01-11 MED ORDER — TIRZEPATIDE-WEIGHT MANAGEMENT 7.5 MG/0.5ML ~~LOC~~ SOLN: 7.5000 mg | SUBCUTANEOUS | 2 refills | Status: DC

## 2024-01-11 NOTE — Progress Notes (Unsigned)
 Subjective  CC:  Chief Complaint  Patient presents with   Obesity   Impaired Fasting Glucose   Hypertension    HPI: Ariana Walsh is a 53 y.o. female who presents to the office today to address the problems listed above in the chief complaint. Hypertension f/u: Control is {Desc; good/fair/poor:18582}. Pt reports she is doing well. {Hypertension and cvs ros:5727}. ***She denies adverse effects from his BP medications. Compliance with medication is good.   Assessment  1. Class 1 obesity due to excess calories without serious comorbidity with body mass index (BMI) of 31.0 to 31.9 in adult   2. IFG (impaired fasting glucose)   3. Essential hypertension   4. Paroxysmal atrial fibrillation (HCC)      Plan   Hypertension f/u: BP control is {DESC; WELL/FAIRLY WELL/POORLY:18703} controlled. ***  Education regarding management of these chronic disease states was given. Management strategies discussed on successive visits include dietary and exercise recommendations, goals of achieving and maintaining IBW, and lifestyle modifications aiming for adequate sleep and minimizing stressors.   Follow up: ***  Orders Placed This Encounter  Procedures   POCT HgB A1C   Meds ordered this encounter  Medications   tirzepatide  7.5 MG/0.5ML injection vial    Sig: Inject 7.5 mg into the skin once a week.    Dispense:  2 mL    Refill:  2    Please do not refill the 5mg  dose. We are increasing to 7.5mg . thank you.      BP Readings from Last 3 Encounters:  01/11/24 129/87  12/27/23 (!) 146/88  12/06/23 138/83   Wt Readings from Last 3 Encounters:  01/11/24 188 lb 6.4 oz (85.5 kg)  12/27/23 189 lb (85.7 kg)  12/06/23 188 lb 6.4 oz (85.5 kg)    Lab Results  Component Value Date   CHOL 143 04/13/2023   CHOL 156 02/23/2022   CHOL 154 01/26/2021   Lab Results  Component Value Date   HDL 46.30 04/13/2023   HDL 60.20 02/23/2022   HDL 44.10 01/26/2021   Lab Results  Component  Value Date   LDLCALC 80 04/13/2023   LDLCALC 84 02/23/2022   LDLCALC 79 01/26/2021   Lab Results  Component Value Date   TRIG 84.0 04/13/2023   TRIG 57.0 02/23/2022   TRIG 156.0 (H) 01/26/2021   Lab Results  Component Value Date   CHOLHDL 3 04/13/2023   CHOLHDL 3 02/23/2022   CHOLHDL 3 01/26/2021   No results found for: LDLDIRECT Lab Results  Component Value Date   CREATININE 0.66 04/13/2023   BUN 11 04/13/2023   NA 140 04/13/2023   K 3.8 04/13/2023   CL 106 04/13/2023   CO2 26 04/13/2023    The 10-year ASCVD risk score (Arnett DK, et al., 2019) is: 2%   Values used to calculate the score:     Age: 3 years     Clincally relevant sex: Female     Is Non-Hispanic African American: No     Diabetic: No     Tobacco smoker: No     Systolic Blood Pressure: 129 mmHg     Is BP treated: Yes     HDL Cholesterol: 46.3 mg/dL     Total Cholesterol: 143 mg/dL  I reviewed the patients updated PMH, FH, and SocHx.    Patient Active Problem List   Diagnosis Date Noted   IFG (impaired fasting glucose) 10/12/2023    Priority: High   Class 1 obesity due  to excess calories without serious comorbidity with body mass index (BMI) of 31.0 to 31.9 in adult 12/15/2022    Priority: High   Paroxysmal atrial fibrillation (HCC) 11/29/2021    Priority: High   Family history of coronary artery disease 08/13/2021    Priority: High   OSA on CPAP 10/04/2019    Priority: High   Prehypertension 10/12/2023    Priority: Medium    Granulomatous disease (HCC) 01/26/2023    Priority: Medium    Fatty infiltration of liver 11/11/2021    Priority: Medium    Osteoarthritis of spine with radiculopathy, cervical region 08/28/2021    Priority: Medium    Osteoarthritis of right acromioclavicular joint 08/28/2021    Priority: Medium    Rotator cuff arthropathy of right shoulder 08/28/2021    Priority: Medium    Neuroforaminal stenosis of cervical spine 08/28/2021    Priority: Medium    Mild  intermittent asthma 06/02/2020    Priority: Medium    DJD (degenerative joint disease), lumbar 11/22/2019    Priority: Medium    Osteoarthritis, hip, bilateral 11/22/2019    Priority: Medium    Exercise-induced asthma 09/21/2018    Priority: Medium    Umbilical hernia 12/30/2016    Priority: Medium    Picker's nodules 12/31/2022    Priority: Low   Keratosis pilaris 01/26/2021    Priority: Low   Vitamin B12 deficiency 11/20/2019    Priority: Low   Essential hypertension 01/11/2024   Obesity (BMI 30-39.9) 01/26/2021    Allergies: Latex  Social History: Patient  reports that she has never smoked. She has never used smokeless tobacco. She reports current alcohol use. She reports that she does not use drugs.  Current Meds  Medication Sig   Cyanocobalamin  (B-12) 1000 MCG SUBL Place 1 spray under the tongue daily.   metoprolol  succinate (TOPROL -XL) 25 MG 24 hr tablet Take 1 tablet (25 mg total) by mouth daily. Take with or immediately following a meal.   montelukast  (SINGULAIR ) 10 MG tablet Take 1 tablet (10 mg total) by mouth at bedtime.   tirzepatide  7.5 MG/0.5ML injection vial Inject 7.5 mg into the skin once a week.   [DISCONTINUED] tirzepatide  5 MG/0.5ML injection vial Inject 5 mg into the skin once a week.    Review of Systems: Cardiovascular: negative for chest pain, palpitations, leg swelling, orthopnea Respiratory: negative for SOB, wheezing or persistent cough Gastrointestinal: negative for abdominal pain Genitourinary: negative for dysuria or gross hematuria  Objective  Vitals: BP 129/87   Pulse 62   Temp 98.2 F (36.8 C)   Ht 5' 5 (1.651 m)   Wt 188 lb 6.4 oz (85.5 kg)   LMP 01/25/2019   SpO2 98%   BMI 31.35 kg/m  General: no acute distress  Psych:  Alert and oriented, normal mood and affect HEENT:  Normocephalic, atraumatic, supple neck  Cardiovascular:  RRR without murmur. no edema Respiratory:  Good breath sounds bilaterally, CTAB with normal  respiratory effort Skin:  Warm, no rashes Neurologic:   Mental status is normal Commons side effects, risks, benefits, and alternatives for medications and treatment plan prescribed today were discussed, and the patient expressed understanding of the given instructions. Patient is instructed to call or message via MyChart if he/she has any questions or concerns regarding our treatment plan. No barriers to understanding were identified. We discussed Red Flag symptoms and signs in detail. Patient expressed understanding regarding what to do in case of urgent or emergency type symptoms.  Medication list  was reconciled, printed and provided to the patient in AVS. Patient instructions and summary information was reviewed with the patient as documented in the AVS. This note was prepared with assistance of Dragon voice recognition software. Occasional wrong-word or sound-a-like substitutions may have occurred due to the inherent limitation

## 2024-01-12 ENCOUNTER — Encounter: Payer: Self-pay | Admitting: Family Medicine

## 2024-01-12 NOTE — Patient Instructions (Signed)
 Please follow up as scheduled for your next visit with me: 04/25/2024   If you have any questions or concerns, please don't hesitate to send me a message via MyChart or call the office at [phone]. Thank you for visiting with us  today! It's our pleasure caring for you.

## 2024-01-15 DIAGNOSIS — M8580 Other specified disorders of bone density and structure, unspecified site: Secondary | ICD-10-CM | POA: Insufficient documentation

## 2024-01-15 NOTE — Progress Notes (Signed)
 See mychart note Dear Ms. Liddicoat, Your bone density results show osteopenia, or mildly low bone density. I recommend rechecking in 2 years; in the meantime ensure adequate calcium and vit D intake and regular weight bearing exercise to strengthen bone.  Sincerely, Dr. Jodie

## 2024-01-27 ENCOUNTER — Ambulatory Visit (HOSPITAL_BASED_OUTPATIENT_CLINIC_OR_DEPARTMENT_OTHER): Admitting: Cardiology

## 2024-01-27 ENCOUNTER — Encounter (HOSPITAL_BASED_OUTPATIENT_CLINIC_OR_DEPARTMENT_OTHER): Payer: Self-pay | Admitting: Cardiology

## 2024-01-27 VITALS — BP 121/59 | HR 68 | Ht 64.5 in | Wt 190.5 lb

## 2024-01-27 DIAGNOSIS — E66811 Obesity, class 1: Secondary | ICD-10-CM | POA: Diagnosis not present

## 2024-01-27 DIAGNOSIS — I480 Paroxysmal atrial fibrillation: Secondary | ICD-10-CM | POA: Diagnosis not present

## 2024-01-27 DIAGNOSIS — Z6832 Body mass index (BMI) 32.0-32.9, adult: Secondary | ICD-10-CM

## 2024-01-27 DIAGNOSIS — I471 Supraventricular tachycardia, unspecified: Secondary | ICD-10-CM | POA: Diagnosis not present

## 2024-01-27 DIAGNOSIS — E6609 Other obesity due to excess calories: Secondary | ICD-10-CM

## 2024-01-27 DIAGNOSIS — Z8249 Family history of ischemic heart disease and other diseases of the circulatory system: Secondary | ICD-10-CM | POA: Diagnosis not present

## 2024-01-27 DIAGNOSIS — R030 Elevated blood-pressure reading, without diagnosis of hypertension: Secondary | ICD-10-CM

## 2024-01-27 DIAGNOSIS — Z7189 Other specified counseling: Secondary | ICD-10-CM

## 2024-01-27 NOTE — Patient Instructions (Addendum)
 Medication Instructions:  Continue current medications *If you need a refill on your cardiac medications before your next appointment, please call your pharmacy*  Lab Work: none If you have labs (blood work) drawn today and your tests are completely normal, you will receive your results only by: MyChart Message (if you have MyChart) OR A paper copy in the mail If you have any lab test that is abnormal or we need to change your treatment, we will call you to review the results.  Testing/Procedures: none  Follow-Up: At [facility], you and your health needs are our priority.  As part of our continuing mission to provide you with exceptional heart care, our providers are all part of one team.  This team includes your primary Cardiologist (physician) and Advanced Practice Providers or APPs (Physician Assistants and Nurse Practitioners) who all work together to provide you with the care you need, when you need it.  Your next appointment:   5-6 month(s)  Provider:   Shelda Bruckner, MD    We recommend signing up for the patient portal called MyChart.  Sign up information is provided on this After Visit Summary.  MyChart is used to connect with patients for Virtual Visits (Telemedicine).  Patients are able to view lab/test results, encounter notes, upcoming appointments, etc.  Non-urgent messages can be sent to your provider as well.   To learn more about what you can do with MyChart, go to [email or web address].   Other Instructions Blood pressure     how to check blood pressure:  -sit comfortably in a chair, feet uncrossed and flat on floor, for 5-10 minutes  -arm ideally should rest at the level of the heart. However, arm should be relaxed and not tense (for example, do not hold the arm up unsupported)  -avoid exercise, caffeine, and tobacco for at least 30 minutes prior to BP reading  -don't take BP cuff reading over clothes (always place on skin directly)  -I prefer  to know how well the medication is working, so I would like you to take your readings 1-2 hours after taking your blood pressure medication if possible   I have typically recommended the Omron brand; doesn't need to be top of the line, arm cuffs are more accurate than wrist cuffs.

## 2024-01-27 NOTE — Progress Notes (Signed)
 Cardiology Office Note:  .   Date:  01/27/2024  ID:  Ariana Walsh, DOB [birth year], MRN [id number] PCP: Jodie Lavern CROME, MD  [facility] Providers Cardiologist:  Shelda Bruckner, MD Electrophysiologist:  Will Gladis Norton, MD {  History of Present Illness: .   Ariana Walsh is a 53 y.o. female with a hx of paroxysmal atrial fibrillation, asthma who is seen for follow up today. I initially met her 05/25/21 as a new consult at the request of Jodie Lavern CROME, MD for the evaluation and management of palpitations and pre-op  clearance for hernia repair.   Family History: Father had CABG x4 in his 70s. Everyone in her family has CAD, heart attack, or stroke. CT from 2022 [facility], record scanned in) noted diffuse fatty liver infiltration. Ca score 0 in 2023 S/P atrial fibrillation ablation 09/30/2022 with Dr. Norton  Today: Doing well overall. Has only had one episode when her heart felt a little funny. Check kardia mobile. Reviewed strip on her crist today: regular rate, 130 bpm. Looks like retrograde p waves, likely SVT (dated 05/23/23).  Blood pressure well controlled here. Has a home BP cuff that has been checked and runs higher per report, has read in the 140s systolic on her cuff. Doesn't check routinely. Discussed new cuff, how to check BP.  Feels metoprolol  works very well for her, except that she notes when she starts more intense physical activity, she has to start very slowly and give her heart time to catch up, then she continues with the activity without issues.  Discussed metoprolol , not a strong blood pressure medication, better for rhythm/rate management. Now on zepbound , working to lose weight. Notes that in the past her blood pressure was much improved with weight loss.  Has been on zepbound  about 3 mos, started 194 lbs, now 184 lbs at home. Did 2 mos of 2.5 mg, just took last 5 mg dose, about to start 7.5 mg dose.  ROS: Denies chest  pain, shortness of breath at rest or with normal exertion. No PND, orthopnea, LE edema or unexpected weight gain. No syncope or palpitations. ROS otherwise negative except as noted.   Studies Reviewed: SABRA    EKG:       Physical Exam:   VS:  BP (!) 121/59   Pulse 68   Ht 5' 4.5 (1.638 m)   Wt 190 lb 8 oz (86.4 kg)   LMP 01/25/2019   SpO2 98%   BMI 32.19 kg/m    Wt Readings from Last 3 Encounters:  01/27/24 190 lb 8 oz (86.4 kg)  01/11/24 188 lb 6.4 oz (85.5 kg)  12/27/23 189 lb (85.7 kg)    GEN: Well nourished, well developed in no acute distress HEENT: Normal, moist mucous membranes NECK: No JVD CARDIAC: regular rhythm, normal S1 and S2, no rubs or gallops. No murmur. VASCULAR: Radial and DP pulses 2+ bilaterally. No carotid bruits RESPIRATORY:  Clear to auscultation without rales, wheezing or rhonchi  ABDOMEN: Soft, non-tender, non-distended MUSCULOSKELETAL:  Ambulates independently SKIN: Warm and dry, no edema NEUROLOGIC:  Alert and oriented x 3. No focal neuro deficits noted. PSYCHIATRIC:  Normal affect    ASSESSMENT AND PLAN: .    Paroxysmal atrial fibrillation -was very symptomatic on her afib -s/p ablation, was on flecainide  prior -CHA2DS2/VAS Stroke Risk Points=1, not on anticoagulation -has not had clear recurrence since ablation   Family history of heart disease -calcium score 12/2021 = 0   History of fatty liver on  CT scan.  -we have discussed recommendations for management, risk management -working on lifestyle change -now on GLP as well   Intermittently elevated blood pressure -discussed at length today; home BP cuff runs high, discussed replacing cuff, home monitoring to get more accurate information. Could also consider 24 hr ambulatory monitor if questions remain -at goal today -with the start of zepbound , anticipate that BP will likely improve with weight loss -discussed options if BP remains not at goal, we have options for management. Metoprolol  is  for rhythm and is not a good BP medication. Could change to carvedilol, though this is twice a day, for better BP control. Also could consider change to diltiazem  and stop metoprolol . She has done well on metoprolol  long term, so best option may be to add a low dose of another medication such as ARB in the future if needed.  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  Obesity, BMI 32 -now on zepbound   CV risk counseling and prevention -recommend heart healthy/Mediterranean diet, with whole grains, fruits, vegetable, fish, lean meats, nuts, and olive oil. Limit salt. -recommend moderate walking, 3-5 times/week for 30-50 minutes each session. Aim for at least 150 minutes.week. Goal should be pace of 3 miles/hours, or walking 1.5 miles in 30 minutes -recommend avoidance of tobacco products. Avoid excess alcohol. -ASCVD risk score: The 10-year ASCVD risk score (Arnett DK, et al., 2019) is: 1.8%   Values used to calculate the score:     Age: 66 years     Clincally relevant sex: Female     Is Non-Hispanic African American: No     Diabetic: No     Tobacco smoker: No     Systolic Blood Pressure: 121 mmHg     Is BP treated: Yes     HDL Cholesterol: 46.3 mg/dL     Total Cholesterol: 143 mg/dL    Dispo: 5-6 mos or sooner as needed  Total time of encounter: I spent 40 minutes dedicated to the care of this patient on the date of this encounter to include pre-visit review of records, face-to-face time with the patient discussing conditions above, and clinical documentation with the electronic health record. We specifically spent time today discussing palpitations/SVT, review of Kardia strip, discussion of blood pressure/goals, options for medications.   Signed, Shelda Bruckner, MD   Shelda Bruckner, MD, PhD, [facility]  [facility]  [place]  [facility] at [facility] [address], [address] [place], [place] [address] [phone]

## 2024-02-14 ENCOUNTER — Ambulatory Visit: Admitting: Internal Medicine

## 2024-03-06 ENCOUNTER — Encounter: Payer: Self-pay | Admitting: Family Medicine

## 2024-03-13 MED ORDER — TIRZEPATIDE-WEIGHT MANAGEMENT 10 MG/0.5ML ~~LOC~~ SOLN
10.0000 mg | SUBCUTANEOUS | 2 refills | Status: DC
Start: 2024-03-13 — End: 2024-04-06

## 2024-04-06 MED ORDER — TIRZEPATIDE-WEIGHT MANAGEMENT 10 MG/0.5ML ~~LOC~~ SOLN: 10.0000 mg | SUBCUTANEOUS | 2 refills | Status: DC

## 2024-04-06 NOTE — Addendum Note (Signed)
 Addended by: JODIE GAMMONS on: 04/06/2024 09:04 AM   Modules accepted: Orders

## 2024-04-09 ENCOUNTER — Other Ambulatory Visit (HOSPITAL_COMMUNITY): Payer: Self-pay | Admitting: Physician Assistant

## 2024-04-09 DIAGNOSIS — I480 Paroxysmal atrial fibrillation: Secondary | ICD-10-CM

## 2024-04-09 NOTE — Progress Notes (Unsigned)
 HPI F never smoker followed for OSA, complicated by  Asthma triggered by colds and exercise. Mild Seasonal Rhinitis. HST 11/14/19- AHI 17.3/ hr, desaturation to 80%, body weight 179 lbs  ------------------------------------------------------------------------------------------    12/14/22-  52 yoF never smoker followed for OSA, complicated by  Asthma triggered by colds and exercise. Mild Seasonal Rhinitis, AFib, Old Granulomatous Disease,  CPAP auto 5-15/  Adapt Download- compliance 97%, AHI 1.8/ hr Body weight today-191 lbs Download reviewed.  Doing fine with CPAP.  Goes camping.  Had A-fib ablation done-so far successful. Cardiac CT showed old granulomatous disease which we discussed.  Not aware of any history of histoplasmosis exposure or TB but we will do a QuantiFERON test.  04/10/24- 53 yoF never smoker followed for OSA, complicated by  Asthma triggered by colds and exercise. Mild Seasonal Rhinitis, AFib, Old Granulomatous Disease,  CPAP auto 5-15/  Adapt Download- compliance  Body weight today-    ROS-see HPI   + = positive Constitutional:    weight loss, night sweats, fevers, chills, fatigue, lassitude. HEENT:    headaches, difficulty swallowing, tooth/dental problems, sore throat,       sneezing, itching, ear ache, nasal congestion, post nasal drip, snoring CV:    chest pain, orthopnea, PND, swelling in lower extremities, anasarca,                                   dizziness, palpitations Resp:   shortness of breath with exertion or at rest.                productive cough,   non-productive cough, coughing up of blood.              change in color of mucus.  wheezing.   Skin:    rash or lesions. GI:  No-   heartburn, indigestion, abdominal pain, nausea, vomiting, diarrhea,                 change in bowel habits, loss of appetite GU: dysuria, change in color of urine, no urgency or frequency.   flank pain. MS:   joint pain, stiffness, decreased range of motion, back  pain. Neuro-     nothing unusual Psych:  change in mood or affect.  depression or anxiety.   memory loss.  OBJ- Physical Exam General- Alert, Oriented, Affect-appropriate, Distress- none acute, not obese Skin- rash-none, lesions- none, excoriation- none Lymphadenopathy- none Head- atraumatic            Eyes- Gross vision intact, PERRLA, conjunctivae and secretions clear            Ears- Hearing, canals-normal            Nose-  +Clear mucus, no-Septal dev, mucus, polyps, erosion, perforation             Throat- Mallampati II-III , mucosa clear , drainage- none, tonsils- atrophic Neck- flexible , trachea midline, no stridor , thyroid  nl, carotid no bruit Chest - symmetrical excursion , unlabored           Heart/CV- RRR , no murmur , no gallop  , no rub, nl s1 s2                           - JVD- none , edema- none, stasis changes- none, varices- none           Lung- clear  to P&A, wheeze- none, cough- none , dullness-none, rub- none           Chest wall-  Abd-  Br/ Gen/ Rectal- Not done, not indicated Extrem- cyanosis- none, clubbing, none, atrophy- none, strength- nl Neuro- grossly intact to observation

## 2024-04-10 ENCOUNTER — Encounter: Payer: Self-pay | Admitting: Internal Medicine

## 2024-04-10 ENCOUNTER — Ambulatory Visit: Admitting: Internal Medicine

## 2024-04-10 VITALS — BP 130/78 | HR 70 | Temp 98.4°F | Ht 64.5 in | Wt 176.0 lb

## 2024-04-10 DIAGNOSIS — G4733 Obstructive sleep apnea (adult) (pediatric): Secondary | ICD-10-CM | POA: Diagnosis not present

## 2024-04-10 DIAGNOSIS — J45909 Unspecified asthma, uncomplicated: Secondary | ICD-10-CM | POA: Diagnosis not present

## 2024-04-10 NOTE — Patient Instructions (Signed)
 Glad you are doing well. Please call if we can help

## 2024-04-25 ENCOUNTER — Ambulatory Visit: Payer: Managed Care, Other (non HMO) | Admitting: Family Medicine

## 2024-04-25 ENCOUNTER — Ambulatory Visit (INDEPENDENT_AMBULATORY_CARE_PROVIDER_SITE_OTHER)

## 2024-04-25 ENCOUNTER — Encounter: Payer: Self-pay | Admitting: Family Medicine

## 2024-04-25 VITALS — BP 134/90 | HR 67 | Temp 98.1°F | Ht 64.5 in | Wt 175.8 lb

## 2024-04-25 DIAGNOSIS — E538 Deficiency of other specified B group vitamins: Secondary | ICD-10-CM | POA: Diagnosis not present

## 2024-04-25 DIAGNOSIS — Z23 Encounter for immunization: Secondary | ICD-10-CM | POA: Diagnosis not present

## 2024-04-25 DIAGNOSIS — Z8249 Family history of ischemic heart disease and other diseases of the circulatory system: Secondary | ICD-10-CM

## 2024-04-25 DIAGNOSIS — M25551 Pain in right hip: Secondary | ICD-10-CM

## 2024-04-25 DIAGNOSIS — J45990 Exercise induced bronchospasm: Secondary | ICD-10-CM

## 2024-04-25 DIAGNOSIS — I480 Paroxysmal atrial fibrillation: Secondary | ICD-10-CM

## 2024-04-25 DIAGNOSIS — Z0001 Encounter for general adult medical examination with abnormal findings: Secondary | ICD-10-CM | POA: Diagnosis not present

## 2024-04-25 DIAGNOSIS — Z0000 Encounter for general adult medical examination without abnormal findings: Secondary | ICD-10-CM | POA: Diagnosis not present

## 2024-04-25 DIAGNOSIS — M25552 Pain in left hip: Secondary | ICD-10-CM | POA: Diagnosis not present

## 2024-04-25 DIAGNOSIS — R7301 Impaired fasting glucose: Secondary | ICD-10-CM

## 2024-04-25 DIAGNOSIS — E669 Obesity, unspecified: Secondary | ICD-10-CM

## 2024-04-25 DIAGNOSIS — G4733 Obstructive sleep apnea (adult) (pediatric): Secondary | ICD-10-CM | POA: Diagnosis not present

## 2024-04-25 LAB — CBC WITH DIFFERENTIAL/PLATELET
Basophils Absolute: 0 K/uL (ref 0.0–0.1)
Basophils Relative: 0.5 % (ref 0.0–3.0)
Eosinophils Absolute: 0.1 K/uL (ref 0.0–0.7)
Eosinophils Relative: 3.3 % (ref 0.0–5.0)
HCT: 38.9 % (ref 36.0–46.0)
Hemoglobin: 13.5 g/dL (ref 12.0–15.0)
Lymphocytes Relative: 37.3 % (ref 12.0–46.0)
Lymphs Abs: 1.6 K/uL (ref 0.7–4.0)
MCHC: 34.8 g/dL (ref 30.0–36.0)
MCV: 91.4 fl (ref 78.0–100.0)
Monocytes Absolute: 0.3 K/uL (ref 0.1–1.0)
Monocytes Relative: 6.6 % (ref 3.0–12.0)
Neutro Abs: 2.2 K/uL (ref 1.4–7.7)
Neutrophils Relative %: 52.3 % (ref 43.0–77.0)
Platelets: 276 K/uL (ref 150.0–400.0)
RBC: 4.25 Mil/uL (ref 3.87–5.11)
RDW: 12.4 % (ref 11.5–15.5)
WBC: 4.3 K/uL (ref 4.0–10.5)

## 2024-04-25 LAB — COMPREHENSIVE METABOLIC PANEL WITH GFR
ALT: 19 U/L (ref 0–35)
AST: 16 U/L (ref 0–37)
Albumin: 4.8 g/dL (ref 3.5–5.2)
Alkaline Phosphatase: 60 U/L (ref 39–117)
BUN: 12 mg/dL (ref 6–23)
CO2: 24 meq/L (ref 19–32)
Calcium: 9.4 mg/dL (ref 8.4–10.5)
Chloride: 107 meq/L (ref 96–112)
Creatinine, Ser: 0.69 mg/dL (ref 0.40–1.20)
GFR: 98.35 mL/min (ref >60.00)
Glucose, Bld: 93 mg/dL (ref 70–99)
Potassium: 4 meq/L (ref 3.5–5.1)
Sodium: 140 meq/L (ref 135–145)
Total Bilirubin: 0.6 mg/dL (ref 0.2–1.2)
Total Protein: 6.9 g/dL (ref 6.0–8.3)

## 2024-04-25 LAB — LIPID PANEL
Cholesterol: 148 mg/dL (ref 0–200)
HDL: 45.4 mg/dL (ref >39.00)
LDL Cholesterol: 82 mg/dL (ref 0–99)
NonHDL: 102.2
Total CHOL/HDL Ratio: 3
Triglycerides: 102 mg/dL (ref 0.0–149.0)
VLDL: 20.4 mg/dL (ref 0.0–40.0)

## 2024-04-25 LAB — HEMOGLOBIN A1C: Hgb A1c MFr Bld: 5.4 % (ref 4.6–6.5)

## 2024-04-25 LAB — VITAMIN D 25 HYDROXY (VIT D DEFICIENCY, FRACTURES): VITD: 22.5 ng/mL — ABNORMAL LOW (ref 30.00–100.00)

## 2024-04-25 LAB — VITAMIN B12: Vitamin B-12: 422 pg/mL (ref 211–911)

## 2024-04-25 LAB — TSH: TSH: 1.6 u[IU]/mL (ref 0.35–5.50)

## 2024-04-25 NOTE — Progress Notes (Signed)
 Subjective  Chief Complaint  Patient presents with   Annual Exam    Pt here for Annual Exam and is currently fasting    Obesity    HPI: Ariana Walsh is a 53 y.o. female who presents to [facility] [place] today for a Female Wellness Visit. She also has the concerns and/or needs as listed above in the chief complaint. These will be addressed in addition to the Health Maintenance Visit.   Wellness Visit: annual visit with health maintenance review and exam  HM: mammo, pap and CRC screen all current (neg cologuard last year).  Eligible for Prevnar 20 today.  Diet is good.  Healthy.  Could exercise more.  Very busy at work.  Chronic disease f/u and/or acute problem visit: (deemed necessary to be done in addition to the wellness visit): Discussed the use of AI scribe software for clinical note transcription with the patient, who gave verbal consent to proceed.  History of Present Illness Ariana Walsh is a 53 year old female who presents with hip pain and concerns about menopausal symptoms.  Hip pain, bilateral - Severe, deep pain in the hip, described as 'agony' - Pain similar to that experienced during perimenopause - Previously manageable, but recently worsened - Worsening coincided with an increase in medication dosage, she thinks this could be related to Zepbound . - Does not take pain medication regularly - Occasionally consumes turmeric in her diet for pain - No radicular symptoms or low back pain.  No weakness.  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  No panic symptoms.  Hypertension - History of elevated blood pressure - Blood pressure previously well-controlled at a weight of 160 pounds - Currently taking metoprolol , but did not take it on the morning of the visit - Not regularly monitoring blood pressure at home - No chest pain shortness breath or lower extremity edema.  No palpitations.  Weight loss and dietary changes - Lost approximately 20 pounds on Zepbound .  Tolerating well. - Significant reduction in 'food noise', described as life-changing - Mindful of protein intake - Engages in some physical activity - Difficulty maintaining a consistent exercise routine due to work schedule  Rib pain and injury - History of left-sided rib fractures (ribs seven and eight) - Current tenderness and discomfort on the right side, suspecting a similar injury     Assessment  1. Encounter for well adult exam with abnormal findings   2. IFG (impaired fasting glucose)   3. Paroxysmal atrial fibrillation (HCC)   4. OSA on CPAP   5. Family history of coronary artery disease   6. Exercise-induced asthma   7. Vitamin B12 deficiency   8. Obesity (BMI 30-39.9)   9. Need for pneumococcal 20-valent conjugate vaccination   10. Bilateral hip pain      Plan  Female Wellness Visit: Age appropriate Health Maintenance and Prevention measures were discussed with patient. Included topics are cancer screening recommendations, ways to keep healthy (see  AVS) including dietary and exercise recommendations, regular eye and dental care, use of seat belts, and avoidance of moderate alcohol use and tobacco use.  BMI: discussed patient's BMI and encouraged positive lifestyle modifications to help get to or maintain a target BMI. HM needs and  immunizations were addressed and ordered. See below for orders. See HM and immunization section for updates. Routine labs and screening tests ordered including cmp, cbc and lipids where appropriate. Discussed recommendations regarding Vit D and calcium supplementation (see AVS)  Chronic disease management visit and/or acute problem visit: Assessment and Plan Assessment & Plan Stress and Fatigue Experiencing stress and fatigue due to life circumstances and work. Reports generally good sleep but had a recent night of poor sleep. Engaged in regular physical activity through work but needs more structured exercise, particularly weight training. - Encourage weight training exercises, even short sessions, to build muscle and support weight loss. - Discuss importance of core strengthening exercises. - Encourage regular physical activity.  Hypertension Blood pressure elevated today, possibly due to stress and lack of sleep. Currently on metoprolol , but has not taken it today due to not eating. Blood pressure was previously well-controlled at a lower weight. - Start checking blood pressure at home regularly. - Aim for blood pressure less than 120/70 mmHg. - Consider medication adjustment if blood pressure does not improve in two weeks.  Obesity Weight loss of approximately 20 pounds reported, attributed to medication and lifestyle changes. Significant benefit in reduced 'food noise' and improved relationship with food. Current weight loss is steady, and she is satisfied with progress. - Continue current medication regimen without increasing dosage. - Encourage continued weight loss efforts through diet and exercise. - Monitor weight loss progress.  Suspected Hip Osteoarthritis with Bilateral Hip Pain Experiencing bilateral hip pain, possibly related to osteoarthritis. Pain described as deep inside the hip, exacerbated by certain movements. Pain has been intermittent and may be related to hormonal  changes or medication adjustments. She expressed interest in hip replacement if necessary in the future to maintain function. - Order x-ray of hips to assess for osteoarthritis or other abnormalities. - Consider pain management options if necessary.  Depression, reactive Reports feeling defensive and lacking optimism about the future, though still finds joy in activities. Stress from work and personal life may be contributing factors. Currently seeing a therapist. - Continue therapy sessions.  Encounter for Immunization Due for pneumonia vaccination. Discussed potential side effects, including rare but severe reactions experienced by the provider. - Administer pneumonia vaccine.    Follow up: 6 months for blood pressure recheck Orders Placed This Encounter  Procedures   DG HIPS BILAT W OR W/O PELVIS 3-4 VIEWS   Pneumococcal conjugate vaccine 20-valent (Prevnar 20)   TSH   VITAMIN D  25 Hydroxy (Vit-D Deficiency, Fractures)   CBC with Differential/Platelet   Comprehensive metabolic panel with GFR   Lipid panel   Vitamin B12   Hemoglobin A1c   No orders of the defined types were placed in this encounter.     Body mass index is 29.71 kg/m. Wt Readings from Last 3 Encounters:  04/25/24 175 lb 12.8 oz (79.7 kg)  04/10/24 176 lb (79.8 kg)  01/27/24 190 lb 8 oz (86.4 kg)     Patient Active Problem List   Diagnosis Date Noted   Essential hypertension 01/11/2024    Priority: High   IFG (impaired fasting glucose) 10/12/2023    Priority: High   Paroxysmal atrial fibrillation (HCC) 11/29/2021    Priority: High  S/p ablation 09/2022; stopped flecainaide and eloquis 02/2023. Considering stopping bb if remains in sinus. Dr. Inocencio    Family history of coronary artery disease 08/13/2021    Priority: High    Father and many many family members    Obesity (BMI 30-39.9) 01/26/2021    Priority: High   OSA on CPAP 10/04/2019    Priority: High    HST 11/14/19- AHI 17.3/ hr,  desaturation to 80%, body weight 179 lbs    Osteopenia after menopause 01/15/2024    Priority: Medium     DEXA 01/2024: lowest T = - 2 femoral neck; osteopenia; recheck 2 years.    Granulomatous disease 01/26/2023    Priority: Medium     Cardiac CT 09/29/2022    Fatty infiltration of liver 11/11/2021    Priority: Medium    Osteoarthritis of spine with radiculopathy, cervical region 08/28/2021    Priority: Medium    Osteoarthritis of right acromioclavicular joint 08/28/2021    Priority: Medium    Rotator cuff arthropathy of right shoulder 08/28/2021    Priority: Medium    Neuroforaminal stenosis of cervical spine 08/28/2021    Priority: Medium    Mild intermittent asthma 06/02/2020    Priority: Medium    DJD (degenerative joint disease), lumbar 11/22/2019    Priority: Medium     Xray 11/2019    Osteoarthritis, hip, bilateral 11/22/2019    Priority: Medium     xrays 11/2019    Exercise-induced asthma 09/21/2018    Priority: Medium     Allergy induced also    Umbilical hernia 12/30/2016    Priority: Medium     Dr. Althia. Patient is waiting for a better time to have the surgery.    Picker's nodules 12/31/2022    Priority: Low   Keratosis pilaris 01/26/2021    Priority: Low   Vitamin B12 deficiency 11/20/2019    Priority: Low   Health Maintenance  Topic Date Due   Hepatitis B Vaccines 19-59 Average Risk (1 of 3 - 19+ 3-dose series) Never done   COVID-19 Vaccine (7 - 2025-26 season) 05/11/2024 (Originally 03/12/2024)   Mammogram  11/01/2024   Cervical Cancer Screening (HPV/Pap Cotest)  11/15/2024   Fecal DNA (Cologuard)  11/05/2026   DTaP/Tdap/Td (8 - Td or Tdap) 02/24/2032   Pneumococcal Vaccine: 50+ Years  Completed   Influenza Vaccine  Completed   Hepatitis C Screening  Completed   HIV Screening  Completed   Zoster Vaccines- Shingrix   Completed   HPV VACCINES  Aged Out   Meningococcal B Vaccine  Aged Out   Immunization History  Administered Date(s)  Administered   Dtap, Unspecified 02/10/1970, 03/14/1970, 04/18/1970, 05/06/1971, 11/30/1974   Influenza,inj,Quad PF,6+ Mos 04/10/2024   Influenza-Unspecified 03/26/2020, 03/25/2021, 04/07/2023   MMR 10/02/2009   Measles 11/10/1970   Moderna Covid-19 Fall Seasonal Vaccine 82yrs & older 04/16/2022   Moderna Sars-Covid-2 Vaccination 12/11/2020   Mumps 11/10/1970   PFIZER(Purple Top)SARS-COV-2 Vaccination 09/08/2019, 09/29/2019, 04/05/2020   PNEUMOCOCCAL CONJUGATE-20 04/25/2024   Pfizer Covid-19 Vaccine Bivalent Booster 30yrs & up 08/05/2021   Polio, Unspecified 02/10/1970, 03/14/1970, 04/18/1970, 05/06/1971, 11/30/1974   Tdap 03/26/2011, 02/23/2022   Zoster Recombinant(Shingrix ) 03/11/2022, 06/17/2022   We updated and reviewed the patient's past history in detail and it is documented below. Allergies: Patient is allergic to latex. Past Medical History Patient  has a past medical history of DJD (degenerative joint disease), lumbar (11/22/2019), Exercise-induced asthma (09/21/2018), Hernia, umbilical, Osteoarthritis, hip, bilateral (11/22/2019), and Vitamin B12 deficiency (11/20/2019). Past  Surgical History Patient  has a past surgical history that includes Dental surgery; IVF; Hernia repair; and ATRIAL FIBRILLATION ABLATION (N/A, 09/30/2022). Family History: Patient family history includes Arthritis in her father; Breast cancer in her cousin and mother; Cancer (age of onset: 63) in her father; Diabetes in her father; Endometrial cancer in her mother; Healthy in her son; Heart disease in her father; Hypertension in her father and mother; Mental illness in her brother and father. Social History:  Patient  reports that she has never smoked. She has never used smokeless tobacco. She reports current alcohol use. She reports that she does not use drugs.  Review of Systems: Constitutional: negative for fever or malaise Ophthalmic: negative for photophobia, double vision or loss of  vision Cardiovascular: negative for chest pain, dyspnea on exertion, or new LE swelling Respiratory: negative for SOB or persistent cough Gastrointestinal: negative for abdominal pain, change in bowel habits or melena Genitourinary: negative for dysuria or gross hematuria, no abnormal uterine bleeding or disharge Musculoskeletal: negative for new gait disturbance or muscular weakness Integumentary: negative for new or persistent rashes, no breast lumps Neurological: negative for TIA or stroke symptoms Psychiatric: negative for SI or delusions Allergic/Immunologic: negative for hives  Patient Care Team    Relationship Specialty Notifications Start End  Jodie Lavern CROME, MD PCP - General Family Medicine  02/17/18   Lonni Slain, MD PCP - Cardiology Cardiology  03/26/22   Inocencio Soyla Lunger, MD PCP - Electrophysiology Cardiology  05/03/22   Vernetta Berg, MD Consulting Physician General Surgery  12/30/16   Tanda Locus, MD Consulting Physician General Surgery  03/23/21   Neysa Reggy BIRCH, MD Consulting Physician Pulmonary Disease  01/26/23     Objective  Vitals: BP (!) 134/90   Pulse 67   Temp 98.1 F (36.7 C)   Ht 5' 4.5 (1.638 m)   Wt 175 lb 12.8 oz (79.7 kg)   LMP 01/25/2019   SpO2 99%   BMI 29.71 kg/m  General:  Well developed, well nourished, no acute distress  Psych:  Alert and orientedx3,normal mood and affect HEENT:  Normocephalic, atraumatic, non-icteric sclera,  supple neck without adenopathy, mass or thyromegaly Cardiovascular:  Normal S1, S2, RRR without gallop, rub or murmur Respiratory:  Good breath sounds bilaterally, CTAB with normal respiratory effort Gastrointestinal: normal bowel sounds, soft, non-tender, no noted masses. No HSM MSK: extremities without edema, joints without erythema or swelling Neurologic:    Mental status is normal.  Gross motor and sensory exams are normal.  No tremor Hips: Full range of motion, some pain with extreme  external/internal rotation.  Normal knee exam.  Commons side effects, risks, benefits, and alternatives for medications and treatment plan prescribed today were discussed, and the patient expressed understanding of the given instructions. Patient is instructed to call or message via MyChart if he/she has any questions or concerns regarding our treatment plan. No barriers to understanding were identified. We discussed Red Flag symptoms and signs in detail. Patient expressed understanding regarding what to do in case of urgent or emergency type symptoms.  Medication list was reconciled, printed and provided to the patient in AVS. Patient instructions and summary information was reviewed with the patient as documented in the AVS. This note was prepared with assistance of Dragon voice recognition software. Occasional wrong-word or sound-a-like substitutions may have occurred due to the inherent limitations of voice recognition software

## 2024-04-25 NOTE — Patient Instructions (Signed)
 Please return in 6 months for hypertension follow up.   I will release your lab results to you on your MyChart account with further instructions. You may see the results before I do, but when I review them I will send you a message with my report or have my assistant call you if things need to be discussed. Please reply to my message with any questions. Thank you!   If you have any questions or concerns, please don't hesitate to send me a message via MyChart or call the office at [phone]. Thank you for visiting with us  today! It's our pleasure caring for you.    VISIT SUMMARY: During today's visit, we discussed your hip pain, menopausal symptoms, mood disturbances, hypertension, weight loss, rib pain, and occasional dizziness. We also reviewed your current medications and lifestyle habits.  YOUR PLAN: -STRESS AND FATIGUE: Your stress and fatigue are likely due to your busy life and work. It's important to incorporate weight training exercises, even short sessions, to build muscle and support weight loss. Core strengthening exercises and regular physical activity are also recommended.  -HYPERTENSION: Your blood pressure was elevated today, possibly due to stress and lack of sleep. Hypertension means high blood pressure, which can lead to serious health issues if not managed. You should start checking your blood pressure at home regularly and aim for a reading less than 120/70 mmHg. We may need to adjust your medication if your blood pressure does not improve in two weeks.  -OBESITY: You have lost approximately 20 pounds, which is great progress. Obesity means having excess body fat, which can affect your health. Continue your current medication regimen without increasing the dosage and keep up with your diet and exercise efforts. We will monitor your weight loss progress.  -SUSPECTED HIP OSTEOARTHRITIS WITH BILATERAL HIP PAIN: Your hip pain may be due to osteoarthritis, a condition where the  cartilage in your joints wears down over time. We will order an x-ray of your hips to check for osteoarthritis or other issues. Pain management options will be considered if necessary.  -DEPRESSION: You are experiencing feelings of defensiveness and lack of optimism, which may be related to stress from work and personal life. Depression is a mood disorder that affects how you feel and function. Continue with your therapy sessions to help manage these feelings.  -ENCOUNTER FOR IMMUNIZATION: You are due for a pneumonia vaccination. We discussed the potential side effects, including rare but severe reactions. The vaccine will be administered today.  INSTRUCTIONS: Please start checking your blood pressure at home regularly and aim for a reading less than 120/70 mmHg. If your blood pressure does not improve in two weeks, we may need to adjust your medication. We will also order an x-ray of your hips to assess for osteoarthritis or other abnormalities. Continue with your therapy sessions and maintain your current diet and exercise efforts. You will receive the pneumonia vaccine today.                      Contains text generated by [employer]bridge.                                 Contains text generated by Abridge.

## 2024-04-26 ENCOUNTER — Ambulatory Visit: Payer: Self-pay | Admitting: Family Medicine

## 2024-04-26 NOTE — Progress Notes (Signed)
 See mychart note Dear Ms. Lomeli, Hello! Things look great! Except you need to increase your vitamin D  :). I recommend 2000-4000 units daily.  Chin up and hold on tight!!  Sincerely, Dr. Jodie

## 2024-04-30 NOTE — Progress Notes (Signed)
 See mychart note Dear Ms. Gonyea, Your hip xrays are stable showing mild arthritic changes bilaterally. Sincerely, Dr. Jodie

## 2024-06-14 ENCOUNTER — Other Ambulatory Visit: Payer: Self-pay | Admitting: Family Medicine

## 2024-06-29 ENCOUNTER — Encounter (HOSPITAL_BASED_OUTPATIENT_CLINIC_OR_DEPARTMENT_OTHER): Payer: Self-pay | Admitting: Cardiology

## 2024-06-29 ENCOUNTER — Ambulatory Visit (HOSPITAL_BASED_OUTPATIENT_CLINIC_OR_DEPARTMENT_OTHER): Admitting: Cardiology

## 2024-06-29 VITALS — BP 118/66 | HR 70 | Ht 64.5 in | Wt 170.5 lb

## 2024-06-29 DIAGNOSIS — I471 Supraventricular tachycardia, unspecified: Secondary | ICD-10-CM

## 2024-06-29 DIAGNOSIS — I480 Paroxysmal atrial fibrillation: Secondary | ICD-10-CM | POA: Diagnosis not present

## 2024-06-29 DIAGNOSIS — Z7189 Other specified counseling: Secondary | ICD-10-CM

## 2024-06-29 DIAGNOSIS — Z8249 Family history of ischemic heart disease and other diseases of the circulatory system: Secondary | ICD-10-CM | POA: Diagnosis not present

## 2024-06-29 DIAGNOSIS — R030 Elevated blood-pressure reading, without diagnosis of hypertension: Secondary | ICD-10-CM | POA: Diagnosis not present

## 2024-06-29 NOTE — Patient Instructions (Signed)

## 2024-06-29 NOTE — Progress Notes (Signed)
 " Cardiology Office Note:  .   Date:  06/29/2024  ID:  Ariana Walsh, DOB 03/23/1971, MRN [id number] PCP: Jodie Lavern CROME, MD  [facility] Providers Cardiologist:  Shelda Bruckner, MD Electrophysiologist:  Soyla Gladis Norton, MD {  History of Present Illness: .   Ariana Walsh is a 53 y.o. female with a hx of paroxysmal atrial fibrillation, p SVT, history of obesity who is seen for follow up today. I initially met her 05/25/21 as a new consult at the request of Jodie Lavern CROME, MD for the evaluation and management of palpitations and pre-op  clearance for hernia repair.   Family History: Father had CABG x4 in his 80s. Everyone in her family has CAD, heart attack, or stroke. CT from 2022 [facility], record scanned in) noted diffuse fatty liver infiltration. Ca score 0 in 2023 S/P atrial fibrillation ablation 09/30/2022 with Dr. Norton  Today: Doing ok. She is surprised her blood pressure is as well controlled as it is. She has not been checking routinely at home. Has been very stressed this fall. Feels like her chest has been tight/congested recently.   She is having intermittent tachycardia, most recent episode last night. Frequency is intermittent, stable. Has captured on her Crist, notes that when she showed it to [facility] in the afib clinic, he thought it looked like SVT given that it was regular. Lasts about 30 seconds or less. Doesn't feel like her afib symptoms.   Feels like her weight has plateaued. May talk to her PCP about increasing the GLP dose, has been on 10 mg dose for several months.   ROS: Denies chest pain, shortness of breath at rest or with normal exertion. No PND, orthopnea, LE edema or unexpected weight gain. No syncope. ROS otherwise negative except as noted.   Studies Reviewed: SABRA    EKG:       Physical Exam:   VS:  BP 118/66 (BP Location: Right Arm, Patient Position: Sitting, Cuff Size: Normal)   Pulse 70   Ht 5' 4.5 (1.638  m)   Wt 170 lb 8 oz (77.3 kg)   LMP 01/25/2019   SpO2 97%   BMI 28.81 kg/m    Wt Readings from Last 3 Encounters:  06/29/24 170 lb 8 oz (77.3 kg)  04/25/24 175 lb 12.8 oz (79.7 kg)  04/10/24 176 lb (79.8 kg)    GEN: Well nourished, well developed in no acute distress HEENT: Normal, moist mucous membranes NECK: No JVD CARDIAC: regular rhythm, normal S1 and S2, no rubs or gallops. No murmur. VASCULAR: Radial and DP pulses 2+ bilaterally. No carotid bruits RESPIRATORY:  Clear to auscultation without rales, wheezing or rhonchi  ABDOMEN: Soft, non-tender, non-distended MUSCULOSKELETAL:  Ambulates independently SKIN: Warm and dry, no edema NEUROLOGIC:  Alert and oriented x 3. No focal neuro deficits noted. PSYCHIATRIC:  Normal affect    ASSESSMENT AND PLAN: .    Paroxysmal atrial fibrillation Paroxysmal SVT -has done very well on low dose metoprolol  -was very symptomatic on her afib -s/p afib ablation, was on flecainide  prior -CHA2DS2/VAS Stroke Risk Points=1, not on anticoagulation -has captured a few episodes on her Crist, I have seen some and recently Ricky reviewed in afib clinic, looks regular, more like her prior SVT than her afib. Continue to monitor. -if becomes more frequent/longer lasting, would get Zio monitor to evaluate more fully.    Family history of heart disease -calcium score 12/2021 = 0   History of fatty liver on CT  scan.  -we have discussed recommendations for management, risk management -working on lifestyle change -now on GLP as well   Intermittently elevated blood pressure -at goal today -she is going to look into getting new cuff for home measurements -discussed options if BP remains not at goal. Could change metoprolol  to carvedilol, though this is twice a day, for better BP control. Also could consider change to diltiazem  and stop metoprolol . She has done well on metoprolol  long term, so best option may be to add a low dose of another medication such  as ARB in the future if needed. -she is working on weight loss, stress management, activity as adjuncts as well  History of obesity, BMI 32->28 -now on zepbound  10 mg weekly  CV risk counseling and prevention -recommend heart healthy/Mediterranean diet, with whole grains, fruits, vegetable, fish, lean meats, nuts, and olive oil. Limit salt. -recommend moderate walking, 3-5 times/week for 30-50 minutes each session. Aim for at least 150 minutes.week. Goal should be pace of 3 miles/hours, or walking 1.5 miles in 30 minutes -recommend avoidance of tobacco products. Avoid excess alcohol. -ASCVD risk score: The 10-year ASCVD risk score (Arnett DK, et al., 2019) is: 1.8%   Values used to calculate the score:     Age: 78 years     Clinically relevant sex: Female     Is Non-Hispanic African American: No     Diabetic: No     Tobacco smoker: No     Systolic Blood Pressure: 118 mmHg     Is BP treated: Yes     HDL Cholesterol: 45.4 mg/dL     Total Cholesterol: 148 mg/dL    Dispo: 6 mos or sooner as needed if symptoms worsen  Signed, Shelda Bruckner, MD   Shelda Bruckner, MD, PhD, [facility]  [facility]  [place]  [facility] at [facility] at [facility] [address], [address] [place], [place] [address] [phone]   "

## 2024-07-06 ENCOUNTER — Other Ambulatory Visit: Payer: Self-pay | Admitting: Family Medicine

## 2024-07-22 ENCOUNTER — Encounter: Payer: Self-pay | Admitting: Family Medicine

## 2024-07-23 NOTE — Telephone Encounter (Signed)
 Please review and advise on increase of Zepbound .

## 2024-07-24 ENCOUNTER — Other Ambulatory Visit: Payer: Self-pay

## 2024-07-24 MED ORDER — ZEPBOUND 12.5 MG/0.5ML ~~LOC~~ SOAJ: 12.5000 mg | SUBCUTANEOUS | 2 refills | Status: DC

## 2024-07-25 ENCOUNTER — Other Ambulatory Visit: Payer: Self-pay

## 2024-07-25 ENCOUNTER — Other Ambulatory Visit: Payer: Self-pay | Admitting: Family Medicine

## 2024-07-25 MED ORDER — ZEPBOUND 12.5 MG/0.5ML ~~LOC~~ SOLN: 12.5000 mg | SUBCUTANEOUS | 3 refills | Status: DC

## 2024-07-25 MED ORDER — ZEPBOUND 12.5 MG/0.5ML ~~LOC~~ SOLN: 12.5000 mg | SUBCUTANEOUS | 3 refills | Status: AC

## 2024-08-02 ENCOUNTER — Encounter: Payer: Self-pay | Admitting: Family Medicine

## 2024-08-02 ENCOUNTER — Other Ambulatory Visit: Payer: Self-pay | Admitting: Family Medicine

## 2024-08-02 ENCOUNTER — Ambulatory Visit: Admitting: Family Medicine

## 2024-08-02 VITALS — BP 130/70 | HR 67 | Temp 97.9°F | Ht 64.5 in | Wt 167.0 lb

## 2024-08-02 DIAGNOSIS — Z780 Asymptomatic menopausal state: Secondary | ICD-10-CM | POA: Diagnosis not present

## 2024-08-02 DIAGNOSIS — N952 Postmenopausal atrophic vaginitis: Secondary | ICD-10-CM

## 2024-08-02 DIAGNOSIS — L409 Psoriasis, unspecified: Secondary | ICD-10-CM | POA: Diagnosis not present

## 2024-08-02 DIAGNOSIS — I10 Essential (primary) hypertension: Secondary | ICD-10-CM

## 2024-08-02 MED ORDER — BETAMETHASONE VALERATE 0.1 % EX OINT: 1.0000 | TOPICAL_OINTMENT | Freq: Two times a day (BID) | CUTANEOUS | 0 refills | Status: AC | PRN

## 2024-08-02 MED ORDER — ESTRADIOL 0.01 % VA CREA: 1.0000 | TOPICAL_CREAM | VAGINAL | 5 refills | Status: DC

## 2024-08-02 NOTE — Patient Instructions (Signed)
 Please follow up if symptoms do not improve or as needed.     VISIT SUMMARY: During your visit, we discussed your concerns about heavy eyelids, painful intercourse, and skin issues. We also reviewed your blood pressure readings and your efforts to manage your weight.  YOUR PLAN: -POSTMENOPAUSAL ATROPHIC VAGINITIS WITH DYSPAREUNIA: This condition involves thinning and inflammation of the vaginal walls due to decreased estrogen levels after menopause, leading to painful intercourse and irritation. We have prescribed a vaginal estrogen cream to be used once nightly for five nights, then two to three times a week. This treatment is considered safe given your family history. If symptoms do not improve, we will schedule a pelvic exam and Pap smear.  -PSORIASIS OF THE FEET: Psoriasis is a skin condition that causes dry, cracked skin. You have been experiencing this on your heels. We have prescribed a steroid cream and ointment to apply to the affected areas.  -ELEVATED BLOOD PRESSURE: Your blood pressure readings have been intermittently elevated. You have successfully lost 30 pounds and are engaging in regular exercise. Continue with your current weight loss regimen and lifestyle modifications, and monitor your blood pressure regularly.  INSTRUCTIONS: Please follow the prescribed treatment for each condition. Use the vaginal estrogen cream as directed, apply the steroid cream and ointment to your heels, and continue monitoring your blood pressure while maintaining your weight loss and exercise routine. If your symptoms of vaginal irritation do not improve, we will schedule a pelvic exam and Pap smear.    Contains text generated by [employer].

## 2024-08-02 NOTE — Progress Notes (Signed)
 "  Subjective  CC:  Chief Complaint  Patient presents with   menopause concerns    Pt want to talk about her postmenopausal and trying to come to terms with some things that she is experiencing     HPI: Ariana Walsh is a 54 y.o. female who presents to the office today to address the problems listed above in the chief complaint. Discussed the use of AI scribe software for clinical note transcription with the patient, who gave verbal consent to proceed.  History of Present Illness Ariana Walsh is a 54 year old female who presents with concerns about heavy eyelids, painful intercourse, and skin issues.  Eyelid heaviness - Sensation of heavy eyelids - Attributes symptoms to aging and possible genetics (father had similar issue) - Uncertain if vision is affected  Dyspareunia and genital irritation - Painful intercourse, onset over past 3-4 months - Three years postmenopausal - External irritation near perineum and clitoris, described as raw, especially after wiping - No visible lesions or rashes - Pain persists despite use of lubrication - Mother with history of breast and endometrial cancer - History of IVF  Blood pressure fluctuations - Monitors blood pressure with [employer] device - Readings often in the 130s mmHg, sometimes higher - Actively attempting weight loss - Weight loss of 30 pounds - Engages in yoga and other weight-bearing activities  Cutaneous symptoms - Skin issues primarily on heels, described as hard and cracked - Uses 40% urea cream at night with socks, but finds nightly application difficult to maintain - Noticed a rough patch on skin, initially attributed to dry winter skin   Assessment  1. Vaginal atrophy   2. Psoriasis   3. Postmenopause   4. 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 Symptoms have worsened over the past 3-4 months. No visible lesions or rashes. Lubrication provides some relief. Concerns about hormone replacement therapy due to family history of breast and endometrial cancer. Vaginal estrogen cream is considered safe and non-systemic, with no contraindications given her family history. - Prescribed vaginal estrogen cream to be used once nightly for five nights, then two to three times a week. - Scheduled a pelvic exam and Pap smear if symptoms do not improve with treatment.  Psoriasis of the feet Psoriasis of the feet, presenting as dry, cracked skin on the heels. No other patches noted. Current treatment with 40% urea cream is insufficient. - Prescribed a steroid cream and ointment for application on affected areas.  Elevated blood pressure w/ h/o HTN; now stable. Intermittent elevated blood pressure readings, with some readings in the 130s and occasional higher readings. Currently on weight loss medication, which has resulted in a 30-pound weight loss. Blood pressure management is ongoing, with a focus on lifestyle modifications including weight loss and exercise. - Continue current weight loss regimen and lifestyle modifications. - Monitor blood pressure regularly.    Follow up: as scheduled No orders of the defined types were placed in this encounter.  Meds ordered this encounter  Medications   estradiol  (ESTRACE ) 0.01 % CREA vaginal cream    Sig: Place 1 Applicatorful vaginally 3 (three) times a week.    Dispense:  42.5 g    Refill:  5   betamethasone  valerate ointment (VALISONE ) 0.1 %    Sig: Apply 1 Application topically 2 (two) times  daily as needed.    Dispense:  45 g    Refill:  0     I reviewed the patients updated PMH, FH, and SocHx.  Patient Active Problem List   Diagnosis Date Noted   Essential hypertension 01/11/2024    Priority: High   IFG (impaired fasting glucose)  10/12/2023    Priority: High   Paroxysmal atrial fibrillation (HCC) 11/29/2021    Priority: High   Family history of coronary artery disease 08/13/2021    Priority: High   Obesity (BMI 30-39.9) 01/26/2021    Priority: High   OSA on CPAP 10/04/2019    Priority: High   Osteopenia after menopause 01/15/2024    Priority: Medium    Granulomatous disease 01/26/2023    Priority: Medium    Fatty infiltration of liver 11/11/2021    Priority: Medium    Osteoarthritis of spine with radiculopathy, cervical region 08/28/2021    Priority: Medium    Osteoarthritis of right acromioclavicular joint 08/28/2021    Priority: Medium    Rotator cuff arthropathy of right shoulder 08/28/2021    Priority: Medium    Neuroforaminal stenosis of cervical spine 08/28/2021    Priority: Medium    Mild intermittent asthma 06/02/2020    Priority: Medium    DJD (degenerative joint disease), lumbar 11/22/2019    Priority: Medium    Osteoarthritis, hip, bilateral 11/22/2019    Priority: Medium    Exercise-induced asthma 09/21/2018    Priority: Medium    Umbilical hernia 12/30/2016    Priority: Medium    Picker's nodules 12/31/2022    Priority: Low   Keratosis pilaris 01/26/2021    Priority: Low   Vitamin B12 deficiency 11/20/2019    Priority: Low   Active Medications[1] Allergies: Patient is allergic to latex. Family History: Patient family history includes Arthritis in her father; Breast cancer in her cousin and mother; Cancer (age of onset: 81) in her father; Diabetes in her father; Endometrial cancer in her mother; Healthy in her son; Heart disease in her father; Hypertension in her father and mother; Mental illness in her brother and father. Social History:  Patient  reports that she has never smoked. She has never been exposed to tobacco smoke. She has never used smokeless tobacco. She reports current alcohol use. She reports that she does not use drugs.  Review of Systems: Constitutional: Negative  for fever malaise or anorexia Cardiovascular: negative for chest pain Respiratory: negative for SOB or persistent cough Gastrointestinal: negative for abdominal pain  Objective  Vitals: BP 130/70   Pulse 67   Temp 97.9 F (36.6 C)   Ht 5' 4.5 (1.638 m)   Wt 167 lb (75.8 kg)   LMP 01/25/2019   SpO2 93%   BMI 28.22 kg/m  General: no acute distress , A&Ox3 HEENT: no ptosis Skin:  Warm, no rashes Posterior ankle and heel with psoriatic patches Commons side effects, risks, benefits, and alternatives for medications and treatment plan prescribed today were discussed, and the patient expressed understanding of the given instructions. Patient is instructed to call or message via MyChart if he/she has any questions or concerns regarding our treatment plan. No barriers to understanding were identified. We discussed Red Flag symptoms and signs in detail. Patient expressed understanding regarding what to do in case of urgent or emergency type symptoms.  Medication list was reconciled, printed and provided to the patient in AVS. Patient instructions and summary information was reviewed with the patient as documented in the AVS. This note  was prepared with assistance of Conservation officer, historic buildings. Occasional wrong-word or sound-a-like substitutions may have occurred due to the inherent limitations of voice recognition software    [1]  Current Meds  Medication Sig   betamethasone  valerate ointment (VALISONE ) 0.1 % Apply 1 Application topically 2 (two) times daily as needed.   cholecalciferol (VITAMIN D3) 25 MCG (1000 UNIT) tablet Take 1,000 Units by mouth daily.   Cyanocobalamin  (B-12) 1000 MCG SUBL Place 1 spray under the tongue daily.   [START ON 08/03/2024] estradiol  (ESTRACE ) 0.01 % CREA vaginal cream Place 1 Applicatorful vaginally 3 (three) times a week.   metoprolol  succinate (TOPROL -XL) 25 MG 24 hr tablet TAKE 1 TABLET BY MOUTH ONCE DAILY WITH OR immediatelly following A meal    montelukast  (SINGULAIR ) 10 MG tablet TAKE 1 TABLET BY MOUTH AT BEDTIME   Tirzepatide -Weight Management (ZEPBOUND ) 12.5 MG/0.5ML SOLN Inject 12.5 mg into the skin once a week.   "

## 2024-08-03 ENCOUNTER — Telehealth: Payer: Self-pay | Admitting: Family Medicine

## 2024-08-03 NOTE — Telephone Encounter (Signed)
 Spoke with [facility] at [facility] and confirmed refill and instructions for estradiol    Copied from CRM [id number]. Topic: Clinical - Prescription Issue >> Aug 03, 2024 11:07 AM Ariana Walsh wrote: Reason for RMF:[facility] is [facility] needing clarification on prescription -estradiol  (ESTRACE ) 0.01 % CREA vaginal cream  PH: [phone]

## 2024-08-03 NOTE — Telephone Encounter (Signed)
 Spoke with [facility] and clarified the dosage amounts and refills.   Copied from CRM #[id number]. Topic: Clinical - Prescription Issue >> Aug 03, 2024 11:40 AM [name] wrote: Reason for CRM: [facility] called in How many grams per application the patient was supposed to be using, they did not need any refill information. They can be reached at [phone].

## 2025-05-01 ENCOUNTER — Encounter: Admitting: Family Medicine
